# Patient Record
Sex: Male | Born: 1945 | Race: White | Hispanic: No | Smoking: Current every day smoker
Health system: Southern US, Community
[De-identification: ages and names within clinical notes are randomized; demographics above are authoritative.]

## PROBLEM LIST (undated history)

## (undated) DIAGNOSIS — I10 Essential (primary) hypertension: Secondary | ICD-10-CM

## (undated) DIAGNOSIS — Z87442 Personal history of urinary calculi: Secondary | ICD-10-CM

## (undated) DIAGNOSIS — C4490 Unspecified malignant neoplasm of skin, unspecified: Secondary | ICD-10-CM

## (undated) DIAGNOSIS — C439 Malignant melanoma of skin, unspecified: Secondary | ICD-10-CM

## (undated) DIAGNOSIS — K635 Polyp of colon: Secondary | ICD-10-CM

## (undated) DIAGNOSIS — K219 Gastro-esophageal reflux disease without esophagitis: Secondary | ICD-10-CM

## (undated) HISTORY — PX: EYE SURGERY: SHX253

## (undated) HISTORY — PX: KIDNEY STONE SURGERY: SHX686

## (undated) HISTORY — PX: EXTRACORPOREAL SHOCK WAVE LITHOTRIPSY: SHX1557

## (undated) HISTORY — PX: CYSTOSCOPY: SUR368

---

## 1994-07-05 DIAGNOSIS — C4491 Basal cell carcinoma of skin, unspecified: Secondary | ICD-10-CM

## 1994-07-05 HISTORY — DX: Basal cell carcinoma of skin, unspecified: C44.91

## 2002-09-20 ENCOUNTER — Encounter: Payer: Self-pay | Admitting: Urology

## 2002-09-20 ENCOUNTER — Ambulatory Visit (HOSPITAL_COMMUNITY): Admission: RE | Admit: 2002-09-20 | Discharge: 2002-09-20 | Payer: Self-pay | Admitting: Urology

## 2002-12-23 HISTORY — PX: COLONOSCOPY: SHX174

## 2003-01-20 ENCOUNTER — Ambulatory Visit (HOSPITAL_COMMUNITY): Admission: RE | Admit: 2003-01-20 | Discharge: 2003-01-20 | Payer: Self-pay | Admitting: Family Medicine

## 2003-01-20 ENCOUNTER — Encounter: Payer: Self-pay | Admitting: Family Medicine

## 2003-06-23 HISTORY — PX: COLONOSCOPY: SHX174

## 2003-07-04 ENCOUNTER — Ambulatory Visit (HOSPITAL_COMMUNITY): Admission: RE | Admit: 2003-07-04 | Discharge: 2003-07-04 | Payer: Self-pay | Admitting: Internal Medicine

## 2009-03-20 DIAGNOSIS — C4492 Squamous cell carcinoma of skin, unspecified: Secondary | ICD-10-CM

## 2009-03-20 HISTORY — DX: Squamous cell carcinoma of skin, unspecified: C44.92

## 2010-11-27 ENCOUNTER — Ambulatory Visit (HOSPITAL_COMMUNITY)
Admission: RE | Admit: 2010-11-27 | Discharge: 2010-11-27 | Payer: Self-pay | Source: Home / Self Care | Admitting: Family Medicine

## 2010-11-29 ENCOUNTER — Ambulatory Visit (HOSPITAL_COMMUNITY)
Admission: RE | Admit: 2010-11-29 | Discharge: 2010-11-29 | Payer: Self-pay | Source: Home / Self Care | Attending: Family Medicine | Admitting: Family Medicine

## 2011-05-10 NOTE — Op Note (Signed)
   NAME:  Sergio Schroeder, Sergio Schroeder                         ACCOUNT NO.:  [id number]   MEDICAL RECORD NO.:  [id number]                   PATIENT TYPE:  AMB   LOCATION:  DAY                                  FACILITY:  [facility]   PHYSICIAN:  Lionel December, M.D.                 DATE OF BIRTH:  09-Oct-1946   DATE OF PROCEDURE:  07/04/2003  DATE OF DISCHARGE:                                 OPERATIVE REPORT   PROCEDURE:  Total colonoscopy.   INDICATION:  Sergio Schroeder is a 65-year-old, Caucasian male, who is undergoing  screening colonoscopy.  He is at average risk for CRC.  Procedure is  reviewed with the patient.  Informed consent was obtained.   PREOPERATIVE MEDICATIONS:  1. Demerol 25 mg IV.  2. Versed 5 mg IV in divided dose.   FINDINGS:  Procedure performed in endoscopy suite.  The patient's vital  signs and O2 saturations were monitored during the procedure and remained  stable.  The patient was placed in left lateral decubitus position.  Rectal  examination was performed.  This was within normal limits.  Scope was placed  in the rectum and advanced under vision to the sigmoid colon and beyond.  Preparation was excellent.  A few scattered diverticula were noted at the  sigmoid and transverse colon.  The scope was passed to the cecum which was  identified by appendiceal orifice, ileocecal valve.  Short segment of TI was  also examined and was normal.  As the scope was withdrawn, colonic mucosa  once again carefully examined.  There were two small polyps at the rectum  which were ablated via cold biopsy and submitted in one container.  The  scope was retroflexed to examine anorectal junction which was unremarkable.  Endoscope was straightened and withdrawn.  The patient tolerated the  procedure well.   FINAL DIAGNOSES:  1. Few, scattered diverticula at the sigmoid and transverse colon.  2. Two small rectal polyps that were ablated via cold biopsy.    RECOMMENDATIONS:  1. High fiber diet.  2. I will  contact the patient with biopsy results and with further     recommendations.                                               Lionel December, M.D.    NR/MEDQ  D:  07/04/2003  T:  07/04/2003  Job:  [id number]   cc:   Angus G. Renard Matter, M.D.  [address]  [place]  [place] [address]  Fax: [phone]

## 2012-07-23 HISTORY — PX: MELANOMA EXCISION: SHX5266

## 2012-08-04 DIAGNOSIS — C439 Malignant melanoma of skin, unspecified: Secondary | ICD-10-CM

## 2012-08-04 HISTORY — DX: Malignant melanoma of skin, unspecified: C43.9

## 2013-05-06 NOTE — H&P (Signed)
  NTS SOAP Note  Vital Signs:  Vitals as of: 05/06/2013: Systolic 138: Diastolic 72: Heart Rate 104: Temp 68F: Height 61ft 8in: Weight 267Lbs 0 Ounces: Pain Level 7: BMI 40.6  BMI : 40.6 kg/m2  Subjective: This 32 Years 12 Months old Male presents for of    ABDOMINAL PAIN : ,Had right upper quadrant abdominal pain radiating to the right flank [place] the [place] soon after eating food.  Mild nausea noted.  No fever, chills, jaundice.  Has known h/o gallstones, seen by me in 2011.  Has had intermittent biliary colic since that time.  Last TCS 15 years ago, states some constipation, known h/o diverticulosis.  No hematochezia.  No emesis noted.  Review of Symptoms:  Constitutional:unremarkable   Head:unremarkable    Eyes:unremarkable   Nose/Mouth/Throat:unremarkable Cardiovascular:  unremarkable   Respiratory:unremarkable   Gastrointestin    abdominal pain Genitourinary:unremarkable       joint and back pain Skin:unremarkable Hematolgic/Lymphatic:unremarkable     Allergic/Immunologic:unremarkable     Past Medical History:    Reviewed   Past Medical History  Surgical History: melanoma excision abdominal wall 2013 Medical Problems:  High Blood pressure Allergies: nkda Medications: losartin   Social History:Reviewed  Social History  Preferred Language: English Race:  White Ethnicity: Hispanic / Latino Age: 67 Years 5 Months Marital Status:  D Alcohol:  Yes, socially Recreational drug(s):  No   Smoking Status: Current every day smoker reviewed on 05/06/2013 Started Date: 12/23/1965 Packs per day: 1.00 Functional Status reviewed on mm/dd/yyyy ------------------------------------------------ Bathing: Normal Cooking: Normal Dressing: Normal Driving: Normal Eating: Normal Managing Meds: Normal Oral Care: Normal Shopping: Normal Toileting: Normal Transferring: Normal Walking: Normal Cognitive Status reviewed on  mm/dd/yyyy ------------------------------------------------ Attention: Normal Decision Making: Normal Language: Normal Memory: Normal Motor: Normal Perception: Normal Problem Solving: Normal Visual and Spatial: Normal   Family History:  Reviewed   Family History              Father:  Cancer             Sibling:  Cancer    Objective Information: General:  Well appearing, well nourished in no distress.   no scleral icterus Heart:  RRR, no murmur Lungs:    CTA bilaterally, no wheezes, rhonchi, rales.  Breathing unlabored. Abdomen:Soft, NT/ND, no HSM, no masses.  Assessment:Biliary colic, cholelithiasis  Diagnosis &amp; Procedure Smart Code   Plan:Scheduled for laparoscopic cholecystectomy on 05/12/13.   Patient Education:Alternative treatments to surgery were discussed with patient (and family).  Risks and benefits  of procedure including bleeding, infection, hepatobiliary injury, and the possibility of an open procedure were fully explained to the patient (and family) who gave informed consent. Patient/family questions were addressed.  Will need TCS, possible EGD in near future.  Patient understands and agrees.  Follow-up:Pending Surgery

## 2013-05-07 ENCOUNTER — Encounter (HOSPITAL_COMMUNITY)
Admission: RE | Admit: 2013-05-07 | Discharge: 2013-05-07 | Disposition: A | Discharge status: Home / Self Care | Payer: Medicare Other | Source: Ambulatory Visit | Attending: General Surgery | Admitting: General Surgery

## 2013-05-07 ENCOUNTER — Encounter (HOSPITAL_COMMUNITY): Payer: Self-pay

## 2013-05-07 ENCOUNTER — Encounter (HOSPITAL_COMMUNITY): Payer: Self-pay | Admitting: Pharmacy Technician

## 2013-05-07 HISTORY — DX: Essential (primary) hypertension: I10

## 2013-05-07 HISTORY — DX: Malignant melanoma of skin, unspecified: C43.9

## 2013-05-07 HISTORY — DX: Personal history of urinary calculi: Z87.442

## 2013-05-07 LAB — CBC WITH DIFFERENTIAL/PLATELET
Basophils Relative: 0 % (ref 0–1)
Hemoglobin: 13.1 g/dL (ref 13.0–17.0)
Lymphs Abs: 1.8 10*3/uL (ref 0.7–4.0)
MCHC: 35.4 g/dL (ref 30.0–36.0)
Monocytes Relative: 12 % (ref 3–12)
Neutro Abs: 10.6 10*3/uL — ABNORMAL HIGH (ref 1.7–7.7)
Neutrophils Relative %: 75 % (ref 43–77)
Platelets: 407 10*3/uL — ABNORMAL HIGH (ref 150–400)
RBC: 3.72 MIL/uL — ABNORMAL LOW (ref 4.22–5.81)

## 2013-05-07 LAB — BASIC METABOLIC PANEL
Calcium: 9.1 mg/dL (ref 8.4–10.5)
GFR calc Af Amer: 48 mL/min — ABNORMAL LOW (ref >90)
GFR calc non Af Amer: 42 mL/min — ABNORMAL LOW (ref >90)
Potassium: 4.2 mEq/L (ref 3.5–5.1)
Sodium: 139 mEq/L (ref 135–145)

## 2013-05-07 LAB — SURGICAL PCR SCREEN
MRSA, PCR: NEGATIVE
Staphylococcus aureus: NEGATIVE

## 2013-05-07 LAB — HEPATIC FUNCTION PANEL
Bilirubin, Direct: 0.3 mg/dL (ref 0.0–0.3)
Total Bilirubin: 0.8 mg/dL (ref 0.3–1.2)

## 2013-05-07 MED ORDER — CHLORHEXIDINE GLUCONATE 4 % EX LIQD: 1.0000 "application " | Freq: Once | CUTANEOUS | Status: DC

## 2013-05-07 NOTE — Progress Notes (Signed)
05/07/13 0923  OBSTRUCTIVE SLEEP APNEA  Have you ever been diagnosed with sleep apnea through a sleep study? No  Do you snore loudly (loud enough to be heard through closed doors)?  1  Do you often feel tired, fatigued, or sleepy during the daytime? 0  Has anyone observed you stop breathing during your sleep? 0  Do you have, or are you being treated for high blood pressure? 1  BMI more than 35 kg/m2? 1  Age over 66 years old? 1  Neck circumference greater than 40 cm/18 inches? 1  Gender: 1  Obstructive Sleep Apnea Score 6  Score 4 or greater  Results sent to PCP

## 2013-05-07 NOTE — Patient Instructions (Signed)
Sergio Schroeder  05/07/2013   Your procedure is scheduled on:  Wednesday, 05/12/13  Report to [facility] [place] [facility].  Call this number if you have problems the morning of surgery: [phone]   Remember:   Do not eat food or drink liquids after midnight.   Take these medicines the morning of surgery with A SIP OF WATER: hyzaar (losartan)   Do not wear jewelry, make-up or nail polish.  Do not wear lotions, powders, or perfumes. You may wear deodorant.  Do not shave 48 hours prior to surgery. Men may shave face and neck.  Do not bring valuables to the hospital.  Contacts, dentures or bridgework may not be worn into surgery.  Leave suitcase in the car. After surgery it may be brought to your room.  For patients admitted to the hospital, checkout time is 11:00 AM the day of  discharge.   Patients discharged the day of surgery will not be allowed to drive  home.  Name and phone number of your driver: sister  Special Instructions: Shower using CHG 2 nights before surgery and the night before surgery.  If you shower the day of surgery use CHG.  Use special wash - you have one bottle of CHG for all showers.  You should use approximately 1/3 of the bottle for each shower.   Please read over the following fact sheets that you were given: Coughing and Deep Breathing, MRSA Information, Surgical Site Infection Prevention, Anesthesia Post-op Instructions and Care and Recovery After Surgery  Laparoscopic Cholecystectomy Laparoscopic cholecystectomy is surgery to remove the gallbladder. The gallbladder is located slightly to the right of center in the abdomen, behind the liver. It is a concentrating and storage sac for the bile produced in the liver. Bile aids in the digestion and absorption of fats. Gallbladder disease (cholecystitis) is an inflammation of your gallbladder. This condition is usually caused by a buildup of gallstones (cholelithiasis) in your gallbladder. Gallstones can block the flow of bile,  resulting in inflammation and pain. In severe cases, emergency surgery may be required. When emergency surgery is not required, you will have time to prepare for the procedure. Laparoscopic surgery is an alternative to open surgery. Laparoscopic surgery usually has a shorter recovery time. Your common bile duct may also need to be examined and explored. Your caregiver will discuss this with you if he or she feels this should be done. If stones are found in the common bile duct, they may be removed. LET YOUR CAREGIVER KNOW ABOUT:  Allergies to food or medicine.  Medicines taken, including vitamins, herbs, eyedrops, over-the-counter medicines, and creams.  Use of steroids (by mouth or creams).  Previous problems with anesthetics or numbing medicines.  History of bleeding problems or blood clots.  Previous surgery.  Other health problems, including diabetes and kidney problems.  Possibility of pregnancy, if this applies. RISKS AND COMPLICATIONS All surgery is associated with risks. Some problems that may occur following this procedure include:  Infection.  Damage to the common bile duct, nerves, arteries, veins, or other internal organs such as the stomach or intestines.  Bleeding.  A stone may remain in the common bile duct. BEFORE THE PROCEDURE  Do not take aspirin for 3 days prior to surgery or blood thinners for 1 week prior to surgery.  Do not eat or drink anything after midnight the night before surgery.  Let your caregiver know if you develop a cold or other infectious problem prior to surgery.  You  should be present 60 minutes before the procedure or as directed. PROCEDURE  You will be given medicine that makes you sleep (general anesthetic). When you are asleep, your surgeon will make several small cuts (incisions) in your abdomen. One of these incisions is used to insert a small, lighted scope (laparoscope) into the abdomen. The laparoscope helps the surgeon see into  your abdomen. Carbon dioxide gas will be pumped into your abdomen. The gas allows more room for the surgeon to perform your surgery. Other operating instruments are inserted through the other incisions. Laparoscopic procedures may not be appropriate when:  There is major scarring from previous surgery.  The gallbladder is extremely inflamed.  There are bleeding disorders or unexpected cirrhosis of the liver.  A pregnancy is near term.  Other conditions make the laparoscopic procedure impossible. If your surgeon feels it is not safe to continue with a laparoscopic procedure, he or she will perform an open abdominal procedure. In this case, the surgeon will make an incision to open the abdomen. This gives the surgeon a larger view and field to work within. This may allow the surgeon to perform procedures that sometimes cannot be performed with a laparoscope alone. Open surgery has a longer recovery time. AFTER THE PROCEDURE  You will be taken to the recovery area where a nurse will watch and check your progress.  You may be allowed to go home the same day.  Do not resume physical activities until directed by your caregiver.  You may resume a normal diet and activities as directed. Document Released: 12/09/2005 Document Revised: 03/02/2012 Document Reviewed: 05/24/2011 [facility] Patient Information 2013 [place], [place]. Laparoscopic Cholecystectomy Care After These instructions give you information on caring for yourself after your procedure. Your doctor may also give you more specific instructions. Call your doctor if you have any problems or questions after your procedure. HOME CARE  Change your bandages (dressings) as told by your doctor.  Keep the wound dry and clean. Wash the wound gently with soap and water. Pat the wound dry with a clean towel.  Do not take baths, swim, or use hot tubs for 10 days, or as told by your doctor.  Only take medicine as told by your doctor.  Eat a  normal diet as told by your doctor.  Do not lift anything heavier than 25 pounds (11.5 kg), or as told by your doctor.  Do not play contact sports for 1 week, or as told by your doctor. GET HELP RIGHT AWAY IF:   Your wound is red, puffy (swollen), or painful.  You have yellowish-white fluid (pus) coming from the wound.  You have fluid draining from the wound for more than 1 day.  You have a bad smell coming from the wound.  Your wound breaks open.  You have a rash.  You have trouble breathing.  You have chest pain.  You have a bad reaction to your medicine.  You have a fever.  You have pain in the shoulders (shoulder strap areas).  You feel dizzy or pass out (faint).  You have severe belly (abdominal) pain.  You feel sick to your stomach (nauseous) or throw up (vomit) for more than 1 day. MAKE SURE YOU:  Understand these instructions.  Will watch your condition.  Will get help right away if you are not doing well or get worse. Document Released: 09/17/2008 Document Revised: 03/02/2012 Document Reviewed: 05/28/2011 [facility] Patient Information 2013 [place], [place].

## 2013-05-12 ENCOUNTER — Ambulatory Visit (HOSPITAL_COMMUNITY): Payer: Medicare Other | Admitting: Anesthesiology

## 2013-05-12 ENCOUNTER — Encounter (HOSPITAL_COMMUNITY)
Admission: RE | Disposition: A | Discharge status: Home / Self Care | Payer: Self-pay | Source: Ambulatory Visit | Attending: General Surgery

## 2013-05-12 ENCOUNTER — Encounter (HOSPITAL_COMMUNITY): Payer: Self-pay | Admitting: Anesthesiology

## 2013-05-12 ENCOUNTER — Encounter (HOSPITAL_COMMUNITY): Payer: Self-pay | Admitting: *Deleted

## 2013-05-12 ENCOUNTER — Inpatient Hospital Stay (HOSPITAL_COMMUNITY)
Admission: RE | Admit: 2013-05-12 | Discharge: 2013-05-16 | DRG: 419 | Disposition: A | Discharge status: Home / Self Care | Payer: Medicare Other | Source: Ambulatory Visit | Attending: General Surgery | Admitting: General Surgery

## 2013-05-12 DIAGNOSIS — F17200 Nicotine dependence, unspecified, uncomplicated: Secondary | ICD-10-CM | POA: Diagnosis present

## 2013-05-12 DIAGNOSIS — D72829 Elevated white blood cell count, unspecified: Secondary | ICD-10-CM | POA: Diagnosis present

## 2013-05-12 DIAGNOSIS — K8000 Calculus of gallbladder with acute cholecystitis without obstruction: Principal | ICD-10-CM | POA: Diagnosis present

## 2013-05-12 HISTORY — PX: CHOLECYSTECTOMY: SHX55

## 2013-05-12 LAB — HEPATIC FUNCTION PANEL
ALT: 56 U/L — ABNORMAL HIGH (ref 0–53)
AST: 57 U/L — ABNORMAL HIGH (ref 0–37)
Alkaline Phosphatase: 63 U/L (ref 39–117)
Indirect Bilirubin: 0.3 mg/dL (ref 0.3–0.9)
Total Protein: 6.9 g/dL (ref 6.0–8.3)

## 2013-05-12 SURGERY — LAPAROSCOPIC CHOLECYSTECTOMY
Anesthesia: General | Site: Abdomen | Wound class: Contaminated

## 2013-05-12 MED ORDER — ONDANSETRON HCL 4 MG/2ML IJ SOLN
INTRAMUSCULAR | Status: AC
  Filled 2013-05-12: qty 2

## 2013-05-12 MED ORDER — LACTATED RINGERS IV SOLN
INTRAVENOUS | Status: DC
  Administered 2013-05-12: 07:00:00 via INTRAVENOUS

## 2013-05-12 MED ORDER — FENTANYL CITRATE 0.05 MG/ML IJ SOLN
INTRAMUSCULAR | Status: AC
  Filled 2013-05-12: qty 2

## 2013-05-12 MED ORDER — GLYCOPYRROLATE 0.2 MG/ML IJ SOLN
INTRAMUSCULAR | Status: AC
  Filled 2013-05-12: qty 3

## 2013-05-12 MED ORDER — ENOXAPARIN SODIUM 40 MG/0.4ML ~~LOC~~ SOLN
40.0000 mg | Freq: Once | SUBCUTANEOUS | Status: AC
  Administered 2013-05-12: 40 mg via SUBCUTANEOUS

## 2013-05-12 MED ORDER — PHENYLEPHRINE HCL 10 MG/ML IJ SOLN
INTRAMUSCULAR | Status: DC | PRN
  Administered 2013-05-12: 100 ug via INTRAVENOUS

## 2013-05-12 MED ORDER — ROCURONIUM BROMIDE 50 MG/5ML IV SOLN
INTRAVENOUS | Status: AC
  Filled 2013-05-12: qty 1

## 2013-05-12 MED ORDER — FENTANYL CITRATE 0.05 MG/ML IJ SOLN
INTRAMUSCULAR | Status: DC | PRN
  Administered 2013-05-12 (×5): 50 ug via INTRAVENOUS
  Administered 2013-05-12: 100 ug via INTRAVENOUS
  Administered 2013-05-12 (×2): 50 ug via INTRAVENOUS

## 2013-05-12 MED ORDER — FENTANYL CITRATE 0.05 MG/ML IJ SOLN
25.0000 ug | INTRAMUSCULAR | Status: DC | PRN
  Administered 2013-05-12 (×2): 50 ug via INTRAVENOUS

## 2013-05-12 MED ORDER — HYDROMORPHONE HCL PF 1 MG/ML IJ SOLN
1.0000 mg | INTRAMUSCULAR | Status: DC | PRN
  Administered 2013-05-12 – 2013-05-14 (×9): 1 mg via INTRAVENOUS
  Filled 2013-05-12 (×7): qty 1

## 2013-05-12 MED ORDER — GLYCOPYRROLATE 0.2 MG/ML IJ SOLN: 0.2000 mg | Freq: Once | INTRAMUSCULAR | Status: DC

## 2013-05-12 MED ORDER — ARTIFICIAL TEARS OP OINT
TOPICAL_OINTMENT | OPHTHALMIC | Status: AC
  Filled 2013-05-12: qty 3.5

## 2013-05-12 MED ORDER — MIDAZOLAM HCL 2 MG/2ML IJ SOLN
1.0000 mg | INTRAMUSCULAR | Status: DC | PRN
  Administered 2013-05-12: 2 mg via INTRAVENOUS

## 2013-05-12 MED ORDER — BUPIVACAINE HCL (PF) 0.5 % IJ SOLN
INTRAMUSCULAR | Status: AC
  Filled 2013-05-12: qty 30

## 2013-05-12 MED ORDER — CLINDAMYCIN PHOSPHATE 900 MG/50ML IV SOLN
INTRAVENOUS | Status: AC
  Filled 2013-05-12: qty 50

## 2013-05-12 MED ORDER — CLINDAMYCIN PHOSPHATE 900 MG/50ML IV SOLN: 900.0000 mg | Freq: Once | INTRAVENOUS | Status: DC

## 2013-05-12 MED ORDER — ACETAMINOPHEN 10 MG/ML IV SOLN
1000.0000 mg | Freq: Four times a day (QID) | INTRAVENOUS | Status: AC
  Administered 2013-05-12 – 2013-05-13 (×4): 1000 mg via INTRAVENOUS
  Filled 2013-05-12 (×3): qty 100

## 2013-05-12 MED ORDER — HYDROMORPHONE HCL PF 1 MG/ML IJ SOLN
1.0000 mg | INTRAMUSCULAR | Status: DC | PRN
  Administered 2013-05-12: 1 mg via INTRAVENOUS
  Filled 2013-05-12 (×3): qty 1

## 2013-05-12 MED ORDER — PIPERACILLIN-TAZOBACTAM 3.375 G IVPB
3.3750 g | Freq: Three times a day (TID) | INTRAVENOUS | Status: DC
  Administered 2013-05-12 – 2013-05-16 (×12): 3.375 g via INTRAVENOUS
  Filled 2013-05-12 (×16): qty 50

## 2013-05-12 MED ORDER — SUCCINYLCHOLINE CHLORIDE 20 MG/ML IJ SOLN
INTRAMUSCULAR | Status: AC
  Filled 2013-05-12: qty 1

## 2013-05-12 MED ORDER — FENTANYL CITRATE 0.05 MG/ML IJ SOLN
INTRAMUSCULAR | Status: AC
  Filled 2013-05-12: qty 5

## 2013-05-12 MED ORDER — BUPIVACAINE HCL (PF) 0.5 % IJ SOLN
INTRAMUSCULAR | Status: DC | PRN
  Administered 2013-05-12: 10 mL

## 2013-05-12 MED ORDER — PROPOFOL 10 MG/ML IV BOLUS
INTRAVENOUS | Status: DC | PRN
  Administered 2013-05-12: 150 mg via INTRAVENOUS

## 2013-05-12 MED ORDER — ACETAMINOPHEN 10 MG/ML IV SOLN
INTRAVENOUS | Status: AC
  Filled 2013-05-12: qty 100

## 2013-05-12 MED ORDER — PROPOFOL 10 MG/ML IV EMUL
INTRAVENOUS | Status: AC
  Filled 2013-05-12: qty 20

## 2013-05-12 MED ORDER — ONDANSETRON HCL 4 MG/2ML IJ SOLN: 4.0000 mg | Freq: Four times a day (QID) | INTRAMUSCULAR | Status: DC | PRN

## 2013-05-12 MED ORDER — LIDOCAINE HCL (CARDIAC) 20 MG/ML IV SOLN
INTRAVENOUS | Status: DC | PRN
  Administered 2013-05-12: 50 mg via INTRAVENOUS

## 2013-05-12 MED ORDER — 0.9 % SODIUM CHLORIDE (POUR BTL) OPTIME
TOPICAL | Status: DC | PRN
  Administered 2013-05-12: 1000 mL

## 2013-05-12 MED ORDER — LOSARTAN POTASSIUM 50 MG PO TABS
100.0000 mg | ORAL_TABLET | Freq: Every day | ORAL | Status: DC
  Administered 2013-05-12 – 2013-05-16 (×4): 100 mg via ORAL
  Filled 2013-05-12 (×5): qty 2

## 2013-05-12 MED ORDER — NEOSTIGMINE METHYLSULFATE 1 MG/ML IJ SOLN
INTRAMUSCULAR | Status: AC
  Filled 2013-05-12: qty 1

## 2013-05-12 MED ORDER — ENOXAPARIN SODIUM 40 MG/0.4ML ~~LOC~~ SOLN
SUBCUTANEOUS | Status: AC
  Filled 2013-05-12: qty 0.4

## 2013-05-12 MED ORDER — ONDANSETRON HCL 4 MG/2ML IJ SOLN
4.0000 mg | Freq: Once | INTRAMUSCULAR | Status: AC
  Administered 2013-05-12: 4 mg via INTRAVENOUS

## 2013-05-12 MED ORDER — LACTATED RINGERS IV SOLN
INTRAVENOUS | Status: DC | PRN
  Administered 2013-05-12 (×3): via INTRAVENOUS

## 2013-05-12 MED ORDER — ONDANSETRON HCL 4 MG/2ML IJ SOLN: 4.0000 mg | Freq: Once | INTRAMUSCULAR | Status: DC | PRN

## 2013-05-12 MED ORDER — SODIUM CHLORIDE 0.9 % IR SOLN
Status: DC | PRN
  Administered 2013-05-12: 3000 mL

## 2013-05-12 MED ORDER — EPHEDRINE SULFATE 50 MG/ML IJ SOLN
INTRAMUSCULAR | Status: AC
  Filled 2013-05-12: qty 1

## 2013-05-12 MED ORDER — ONDANSETRON HCL 4 MG PO TABS: 4.0000 mg | ORAL_TABLET | Freq: Four times a day (QID) | ORAL | Status: DC | PRN

## 2013-05-12 MED ORDER — ENOXAPARIN SODIUM 40 MG/0.4ML ~~LOC~~ SOLN
40.0000 mg | SUBCUTANEOUS | Status: DC
  Administered 2013-05-13 – 2013-05-16 (×4): 40 mg via SUBCUTANEOUS
  Filled 2013-05-12 (×4): qty 0.4

## 2013-05-12 MED ORDER — MIDAZOLAM HCL 2 MG/2ML IJ SOLN
INTRAMUSCULAR | Status: AC
  Filled 2013-05-12: qty 2

## 2013-05-12 MED ORDER — LOSARTAN POTASSIUM-HCTZ 100-25 MG PO TABS: 1.0000 | ORAL_TABLET | Freq: Every day | ORAL | Status: DC

## 2013-05-12 MED ORDER — NEOSTIGMINE METHYLSULFATE 1 MG/ML IJ SOLN
INTRAMUSCULAR | Status: DC | PRN
  Administered 2013-05-12: 4 mg via INTRAVENOUS

## 2013-05-12 MED ORDER — LACTATED RINGERS IV SOLN
INTRAVENOUS | Status: DC
  Administered 2013-05-12 – 2013-05-13 (×2): via INTRAVENOUS

## 2013-05-12 MED ORDER — ROCURONIUM BROMIDE 100 MG/10ML IV SOLN
INTRAVENOUS | Status: DC | PRN
  Administered 2013-05-12: 10 mg via INTRAVENOUS
  Administered 2013-05-12: 30 mg via INTRAVENOUS
  Administered 2013-05-12 (×2): 10 mg via INTRAVENOUS

## 2013-05-12 MED ORDER — EPHEDRINE SULFATE 50 MG/ML IJ SOLN
INTRAMUSCULAR | Status: DC | PRN
  Administered 2013-05-12 (×3): 10 mg via INTRAVENOUS

## 2013-05-12 MED ORDER — CLINDAMYCIN PHOSPHATE 900 MG/50ML IV SOLN
INTRAVENOUS | Status: DC | PRN
  Administered 2013-05-12: 900 mg via INTRAVENOUS

## 2013-05-12 MED ORDER — HYDROCHLOROTHIAZIDE 25 MG PO TABS
25.0000 mg | ORAL_TABLET | Freq: Every day | ORAL | Status: DC
  Administered 2013-05-12 – 2013-05-16 (×4): 25 mg via ORAL
  Filled 2013-05-12 (×5): qty 1

## 2013-05-12 MED ORDER — PHENYLEPHRINE HCL 10 MG/ML IJ SOLN
INTRAMUSCULAR | Status: AC
  Filled 2013-05-12: qty 1

## 2013-05-12 MED ORDER — LIDOCAINE HCL (PF) 1 % IJ SOLN
INTRAMUSCULAR | Status: AC
  Filled 2013-05-12: qty 5

## 2013-05-12 MED ORDER — GLYCOPYRROLATE 0.2 MG/ML IJ SOLN
INTRAMUSCULAR | Status: DC | PRN
  Administered 2013-05-12: 0.6 mg via INTRAVENOUS

## 2013-05-12 MED ORDER — HEMOSTATIC AGENTS (NO CHARGE) OPTIME
TOPICAL | Status: DC | PRN
  Administered 2013-05-12 (×2): 1 via TOPICAL

## 2013-05-12 MED ORDER — SUCCINYLCHOLINE CHLORIDE 20 MG/ML IJ SOLN
INTRAMUSCULAR | Status: DC | PRN
  Administered 2013-05-12: 120 mg via INTRAVENOUS

## 2013-05-12 SURGICAL SUPPLY — 44 items
APPLIER CLIP LAPSCP 10X32 DD (CLIP) ×2 IMPLANT
BAG HAMPER (MISCELLANEOUS) ×2 IMPLANT
BAG SPEC RTRVL LRG 6X4 10 (ENDOMECHANICALS) ×1
CLOTH BEACON ORANGE TIMEOUT ST (SAFETY) ×2 IMPLANT
COVER LIGHT HANDLE STERIS (MISCELLANEOUS) ×4 IMPLANT
CUTTER LINEAR ENDO 35 ETS TH (STAPLE) ×1 IMPLANT
DECANTER SPIKE VIAL GLASS SM (MISCELLANEOUS) ×2 IMPLANT
DISSECTOR BLUNT TIP ENDO 5MM (MISCELLANEOUS) ×1 IMPLANT
DURAPREP 26ML APPLICATOR (WOUND CARE) ×2 IMPLANT
ELECT REM PT RETURN 9FT ADLT (ELECTROSURGICAL) ×2
ELECTRODE REM PT RTRN 9FT ADLT (ELECTROSURGICAL) ×1 IMPLANT
EVACUATOR DRAINAGE 10X20 100CC (DRAIN) IMPLANT
EVACUATOR SILICONE 100CC (DRAIN) ×2
FILTER SMOKE EVAC LAPAROSHD (FILTER) ×2 IMPLANT
FORMALIN 10 PREFIL 120ML (MISCELLANEOUS) ×2 IMPLANT
GLOVE BIO SURGEON STRL SZ7.5 (GLOVE) ×2 IMPLANT
GLOVE BIOGEL PI IND STRL 7.0 (GLOVE) IMPLANT
GLOVE BIOGEL PI INDICATOR 7.0 (GLOVE) ×3
GLOVE ECLIPSE 6.5 STRL STRAW (GLOVE) ×2 IMPLANT
GLOVE ECLIPSE 7.0 STRL STRAW (GLOVE) ×1 IMPLANT
GLOVE EXAM NITRILE MD LF STRL (GLOVE) ×1 IMPLANT
GOWN STRL REIN XL XLG (GOWN DISPOSABLE) ×3 IMPLANT
HEMOSTAT SNOW SURGICEL 2X4 (HEMOSTASIS) ×3 IMPLANT
INST SET LAPROSCOPIC AP (KITS) ×2 IMPLANT
IV NS IRRIG 3000ML ARTHROMATIC (IV SOLUTION) ×1 IMPLANT
KIT ROOM TURNOVER APOR (KITS) ×2 IMPLANT
KIT TROCAR LAP CHOLE (TROCAR) ×2 IMPLANT
MANIFOLD NEPTUNE II (INSTRUMENTS) ×2 IMPLANT
NS IRRIG 1000ML POUR BTL (IV SOLUTION) ×2 IMPLANT
PACK LAP CHOLE LZT030E (CUSTOM PROCEDURE TRAY) ×2 IMPLANT
PAD ARMBOARD 7.5X6 YLW CONV (MISCELLANEOUS) ×2 IMPLANT
POUCH SPECIMEN RETRIEVAL 10MM (ENDOMECHANICALS) ×2 IMPLANT
SET BASIN LINEN APH (SET/KITS/TRAYS/PACK) ×2 IMPLANT
SET TUBE IRRIG SUCTION NO TIP (IRRIGATION / IRRIGATOR) ×1 IMPLANT
SLEEVE Z-THREAD 5X100MM (TROCAR) ×1 IMPLANT
SPONGE DRAIN TRACH 4X4 STRL 2S (GAUZE/BANDAGES/DRESSINGS) ×1 IMPLANT
SPONGE GAUZE 2X2 8PLY STRL LF (GAUZE/BANDAGES/DRESSINGS) ×8 IMPLANT
STAPLER VISISTAT (STAPLE) ×2 IMPLANT
SUT ETHILON 3 0 FSL (SUTURE) ×1 IMPLANT
SUT SILK 0 FSL (SUTURE) ×1 IMPLANT
SUT VICRYL 0 UR6 27IN ABS (SUTURE) ×2 IMPLANT
TAPE CLOTH SURG 4X10 WHT LF (GAUZE/BANDAGES/DRESSINGS) ×1 IMPLANT
WARMER LAPAROSCOPE (MISCELLANEOUS) ×2 IMPLANT
YANKAUER SUCT 12FT TUBE ARGYLE (SUCTIONS) ×2 IMPLANT

## 2013-05-12 NOTE — Anesthesia Postprocedure Evaluation (Addendum)
  Anesthesia Post-op Note  Patient: Ernst L Reichel  Procedure(s) Performed: Procedure(s): LAPAROSCOPIC CHOLECYSTECTOMY (N/A)  Patient Location: PACU  Anesthesia Type:General  Level of Consciousness: sedated and patient cooperative  Airway and Oxygen Therapy: Patient Spontanous Breathing and Patient connected to face mask oxygen  Post-op Pain: mild  Post-op Assessment: Post-op Vital signs reviewed, Patient's Cardiovascular Status Stable, Respiratory Function Stable, Patent Airway, No signs of Nausea or vomiting, Adequate PO intake and Pain level controlled  Post-op Vital Signs: Reviewed and stable  Complications: No apparent anesthesia complications 05/13/13  Patient out of bed, VSS, remains on O2 via [place].  No apparent anesthesia complications.

## 2013-05-12 NOTE — Interval H&P Note (Signed)
History and Physical Interval Note:  05/12/2013 7:21 AM  Sergio Schroeder  has presented today for surgery, with the diagnosis of cholelithiasis  The various methods of treatment have been discussed with the patient and family. After consideration of risks, benefits and other options for treatment, the patient has consented to  Procedure(s): LAPAROSCOPIC CHOLECYSTECTOMY (N/A) as a surgical intervention .  The patient's history has been reviewed, patient examined, no change in status, stable for surgery.  I have reviewed the patient's chart and labs.  Questions were answered to the patient's satisfaction.     Franky Macho A

## 2013-05-12 NOTE — Anesthesia Procedure Notes (Signed)
Procedure Name: Intubation Date/Time: 05/12/2013 7:37 AM Performed by: Carolyne Littles, AMY L Pre-anesthesia Checklist: Patient identified, Patient being monitored, Timeout performed, Emergency Drugs available and Suction available Patient Re-evaluated:Patient Re-evaluated prior to inductionOxygen Delivery Method: Circle System Utilized Preoxygenation: Pre-oxygenation with 100% oxygen Intubation Type: IV induction, Rapid sequence and Cricoid Pressure applied Ventilation: Mask ventilation without difficulty Laryngoscope Size: 3 and Miller Grade View: Grade I Tube type: Oral Tube size: 7.0 mm Number of attempts: 1 Airway Equipment and Method: stylet Placement Confirmation: ETT inserted through vocal cords under direct vision,  positive ETCO2 and breath sounds checked- equal and bilateral Secured at: 21 cm Tube secured with: Tape Dental Injury: Teeth and Oropharynx as per pre-operative assessment

## 2013-05-12 NOTE — Anesthesia Preprocedure Evaluation (Signed)
Anesthesia Evaluation  Patient identified by MRN, date of birth, ID band Patient awake    Reviewed: Allergy & Precautions, H&P , NPO status , Patient's Chart, lab work & pertinent test results  Airway Mallampati: II TM Distance: >3 FB Neck ROM: Full    Dental  (+) Teeth Intact   Pulmonary Current Smoker (am cough),  breath sounds clear to auscultation        Cardiovascular hypertension, Pt. on medications Rhythm:Regular Rate:Normal     Neuro/Psych    GI/Hepatic GERD-  ,  Endo/Other  Morbid obesity  Renal/GU      Musculoskeletal   Abdominal   Peds  Hematology   Anesthesia Other Findings   Reproductive/Obstetrics                           Anesthesia Physical Anesthesia Plan  ASA: II  Anesthesia Plan: General   Post-op Pain Management:    Induction: Intravenous, Rapid sequence and Cricoid pressure planned  Airway Management Planned: Oral ETT  Additional Equipment:   Intra-op Plan:   Post-operative Plan: Extubation in OR  Informed Consent: I have reviewed the patients History and Physical, chart, labs and discussed the procedure including the risks, benefits and alternatives for the proposed anesthesia with the patient or authorized representative who has indicated his/her understanding and acceptance.     Plan Discussed with:   Anesthesia Plan Comments:         Anesthesia Quick Evaluation

## 2013-05-12 NOTE — Preoperative (Signed)
Beta Blockers   Reason not to administer Beta Blockers:Not Applicable 

## 2013-05-12 NOTE — Transfer of Care (Signed)
Immediate Anesthesia Transfer of Care Note  Patient: Sergio Schroeder  Procedure(s) Performed: Procedure(s): LAPAROSCOPIC CHOLECYSTECTOMY (N/A)  Patient Location: PACU  Anesthesia Type:General  Level of Consciousness: sedated and patient cooperative  Airway & Oxygen Therapy: Patient Spontanous Breathing and Patient connected to face mask oxygen  Post-op Assessment: Report given to PACU RN and Post -op Vital signs reviewed and stable  Post vital signs: Reviewed and stable  Complications: No apparent anesthesia complications

## 2013-05-12 NOTE — Op Note (Signed)
Patient:  Sergio Schroeder  DOB:  07/28/1946  MRN:  [id number]   Preop Diagnosis:  Cholecystitis, cholelithiasis  Postop Diagnosis:  Same, gangrene of gallbladder  Procedure:  Laparoscopic cholecystectomy  Surgeon:  Franky Macho, M.D.  Anes:  General endotracheal  Indications:  Patient is a 67 year old white male with a known history of gallstones who had an attack of biliary colic last week. He now presents for laparoscopic cholecystectomy. The risks and benefits of the procedure including bleeding, infection, hepatobiliary injury, and the possibility of an open procedure were fully explained to the patient, who gave informed consent.  Procedure note:  The patient is placed the supine position. After induction of general endotracheal anesthesia, the abdomen was prepped and draped using usual sterile technique with DuraPrep. Surgical site confirmation was performed.  A supraumbilical incision was made down to the fascia. A Veress needle was introduced into the abdominal cavity and confirmation of placement was done using the saline drop test. The abdomen was then insufflated to 16 mm mercury pressure. An 11 mm trocar was introduced into the abdominal cavity under direct visualization without difficulty. The patient was placed in reverse Trendelenburg position and an additional 11 mm trocar was placed the epigastric region and 5 mm trochars were placed the right the quadrant and right flank regions. The liver was inspected and noted within normal limits. The gallbladder was noted to be diffusely inflamed with a thickened wall. The colon was attached to the gallbladder and this had to be freed away bluntly. In order to facilitate further exposure, an additional 5 mm trocar was placed in the upper midline abdominal wall. The wall was friable in the gallbladder lumen was entered into. Multiple stones were removed as well as purulent fluid. The dissection was done in a dynamic fashion in order to expose  the triangle of Calot.  Both an infundibular as well as retrograde dissection was performed. The cystic duct was ultimately identified. A vascular Endo GIA was placed across the cystic duct and fired. The cystic artery was divided and ligated without difficulty. The gallbladder was then freed away from its remaining attachments using Bovie electrocautery. The gallbladder was removed using an Endo Catch bag and sent to pathology further examination. The gallbladder wall was inspected and any bleeding was controlled using Bovie electrocautery. Surgicel is placed in the gallbladder fossa. A #10 flat Jackson-Pratt drain was placed into the subhepatic space and brought through a 5 mm lateral trocar site. It was secured at the skin level using 3-0 nylon interrupted suture. All fluid and air were then evacuated from the abdominal cavity prior to removal of the trochars.  All wounds were gave normal saline. All wounds were checked with 0.5% Sensorcaine. The suprabuccal fashion as well as epigastric fascia reapproximated using 0 Vicryl interrupted sutures. All skin incisions were closed using staples. Betadine ointment dorsal dressings were applied.  All tape and needle counts were correct at the end of the procedure. The patient was extubated in the operating room and transferred to PACU in stable condition.  Complications:  None  EBL:  50 cc  Specimen:  Gallbladder  Drains: JP drain to the subhepatic space

## 2013-05-13 ENCOUNTER — Encounter (HOSPITAL_COMMUNITY): Payer: Self-pay | Admitting: General Surgery

## 2013-05-13 LAB — BASIC METABOLIC PANEL
BUN: 27 mg/dL — ABNORMAL HIGH (ref 6–23)
CO2: 33 mEq/L — ABNORMAL HIGH (ref 19–32)
Calcium: 9 mg/dL (ref 8.4–10.5)
Creatinine, Ser: 1.31 mg/dL (ref 0.50–1.35)
Glucose, Bld: 104 mg/dL — ABNORMAL HIGH (ref 70–99)

## 2013-05-13 LAB — CBC
HCT: 34 % — ABNORMAL LOW (ref 39.0–52.0)
Hemoglobin: 11.6 g/dL — ABNORMAL LOW (ref 13.0–17.0)
MCV: 99.4 fL (ref 78.0–100.0)
RBC: 3.42 MIL/uL — ABNORMAL LOW (ref 4.22–5.81)
RDW: 18.9 % — ABNORMAL HIGH (ref 11.5–15.5)
WBC: 14 10*3/uL — ABNORMAL HIGH (ref 4.0–10.5)

## 2013-05-13 LAB — MAGNESIUM: Magnesium: 1.1 mg/dL — ABNORMAL LOW (ref 1.5–2.5)

## 2013-05-13 MED ORDER — ACETAMINOPHEN 10 MG/ML IV SOLN
1000.0000 mg | Freq: Four times a day (QID) | INTRAVENOUS | Status: AC
  Administered 2013-05-13 (×2): 1000 mg via INTRAVENOUS
  Filled 2013-05-13 (×4): qty 100

## 2013-05-13 MED ORDER — KCL IN DEXTROSE-NACL 40-5-0.45 MEQ/L-%-% IV SOLN
INTRAVENOUS | Status: DC
  Administered 2013-05-13 – 2013-05-14 (×3): via INTRAVENOUS

## 2013-05-13 NOTE — Progress Notes (Signed)
UR chart review completed.  

## 2013-05-13 NOTE — Care Management Note (Unsigned)
    Page 1 of 1   05/13/2013     1:22:32 PM   CARE MANAGEMENT NOTE 05/13/2013  Patient:  Sergio Schroeder, Sergio Schroeder   Account Number:  [id number]  Date Initiated:  05/13/2013  Documentation initiated by:  Sharrie Rothman  Subjective/Objective Assessment:   Pt admitted from home s/p lap gangreen gallbladder. Pt lives alone but has a sister who will be available to help pt out at discharge. Pt is independent with ADL's.     Action/Plan:   Pt will discharge with JP drain but pt feels he does not need any HH. CM will continue to monitor for any HH needs.   Anticipated DC Date:  05/15/2013   Anticipated DC Plan:  HOME/SELF CARE      DC Planning Services  CM consult      Choice offered to / List presented to:             Status of service:  Completed, signed off Medicare Important Message given?   (If response is "NO", the following Medicare IM given date fields will be blank) Date Medicare IM given:   Date Additional Medicare IM given:    Discharge Disposition:    Per UR Regulation:    If discussed at Long Length of Stay Meetings, dates discussed:    Comments:  05/13/13 1321 Arlyss Queen, RN BSN CM

## 2013-05-13 NOTE — Progress Notes (Signed)
1 Day Post-Op  Subjective: Is having incisional and right sided flank pain. Is controlled with IV Dilaudid.  Objective: Vital signs in last 24 hours: Temp:  [97.2 F (36.2 C)-98.7 F (37.1 C)] 97.4 F (36.3 C) (05/22 0552) Pulse Rate:  [75-96] 96 (05/22 0552) Resp:  [18-26] 18 (05/22 0552) BP: (81-127)/(49-78) 126/63 mmHg (05/22 0552) SpO2:  [90 %-98 %] 90 % (05/22 0552) Last BM Date: 05/12/13  Intake/Output from previous day: 05/21 0701 - 05/22 0700 In: 5348.3 [P.O.:390; I.V.:4408.3; IV Piggyback:550] Out: 1490 [Urine:1000; Drains:480; Blood:10] Intake/Output this shift: Total I/O In: 70 [Other:70] Out: 500 [Urine:500]  General appearance: alert, cooperative and fatigued Resp: clear to auscultation bilaterally Cardio: regular rate and rhythm, S1, S2 normal, no murmur, click, rub or gallop GI: Soft. No rigidity noted. Dressings dry and intact. JP drainage with serous sanguinous fluid present.  Lab Results:   Recent Labs  05/13/13 0455  WBC 14.0*  HGB 11.6*  HCT 34.0*  PLT 378   BMET  Recent Labs  05/13/13 0455  NA 136  K 3.4*  CL 93*  CO2 33*  GLUCOSE 104*  BUN 27*  CREATININE 1.31  CALCIUM 9.0   PT/INR No results found for this basename: LABPROT, INR,  in the last 72 hours  Studies/Results: No results found.  Anti-infectives: Anti-infectives   Start     Dose/Rate Route Frequency Ordered Stop   05/12/13 1300  piperacillin-tazobactam (ZOSYN) IVPB 3.375 g     3.375 g 12.5 mL/hr over 240 Minutes Intravenous Every 8 hours 05/12/13 1241     05/12/13 0700  clindamycin (CLEOCIN) IVPB 900 mg  Status:  Discontinued     900 mg 100 mL/hr over 30 Minutes Intravenous  Once 05/12/13 0650 05/12/13 1130      Assessment/Plan: s/p Procedure(s): LAPAROSCOPIC CHOLECYSTECTOMY Impression: Is stable on postoperative day one. Will continue IV antibiotic. Advance diet as tolerated. Anticipate discharge in next 24-48 hours.  LOS: 1 day    Doranne Schmutz  A 05/13/2013

## 2013-05-14 LAB — BASIC METABOLIC PANEL
BUN: 22 mg/dL (ref 6–23)
CO2: 30 mEq/L (ref 19–32)
Chloride: 94 mEq/L — ABNORMAL LOW (ref 96–112)
Creatinine, Ser: 1.27 mg/dL (ref 0.50–1.35)
Glucose, Bld: 144 mg/dL — ABNORMAL HIGH (ref 70–99)

## 2013-05-14 LAB — HEPATIC FUNCTION PANEL
Alkaline Phosphatase: 77 U/L (ref 39–117)
Bilirubin, Direct: 0.2 mg/dL (ref 0.0–0.3)
Total Bilirubin: 0.8 mg/dL (ref 0.3–1.2)

## 2013-05-14 LAB — CBC
HCT: 34.7 % — ABNORMAL LOW (ref 39.0–52.0)
MCH: 34.4 pg — ABNORMAL HIGH (ref 26.0–34.0)
MCHC: 34.6 g/dL (ref 30.0–36.0)
MCV: 99.4 fL (ref 78.0–100.0)
RDW: 19.1 % — ABNORMAL HIGH (ref 11.5–15.5)

## 2013-05-14 MED ORDER — HYDROCODONE-ACETAMINOPHEN 5-325 MG PO TABS
1.0000 | ORAL_TABLET | ORAL | Status: DC | PRN
  Administered 2013-05-14: 1 via ORAL
  Administered 2013-05-14 – 2013-05-15 (×3): 2 via ORAL
  Filled 2013-05-14 (×2): qty 2
  Filled 2013-05-14: qty 1
  Filled 2013-05-14: qty 2

## 2013-05-14 NOTE — Progress Notes (Signed)
2 Days Post-Op  Subjective: Less incisional pain today. No nausea or vomiting. Did have a bowel movement yesterday.  Objective: Vital signs in last 24 hours: Temp:  [97.5 F (36.4 C)-98.1 F (36.7 C)] 98.1 F (36.7 C) (05/23 0422) Pulse Rate:  [87-111] 111 (05/23 0912) Resp:  [20] 20 (05/23 0422) BP: (87-104)/(44-56) 98/44 mmHg (05/23 0912) SpO2:  [93 %-96 %] 96 % (05/23 0422) Last BM Date: 05/12/13  Intake/Output from previous day: 05/22 0701 - 05/23 0700 In: 550 [P.O.:480] Out: 1905 [Urine:1450; Drains:455] Intake/Output this shift: Total I/O In: -  Out: 20 [Drains:20]  General appearance: alert, cooperative and no distress Resp: clear to auscultation bilaterally Cardio: regular rate and rhythm, S1, S2 normal, no murmur, click, rub or gallop GI: Soft. Dressings intact. JP drainage serosanguineous in nature.  Lab Results:   Recent Labs  05/13/13 0455 05/14/13 0459  WBC 14.0* 19.8*  HGB 11.6* 12.0*  HCT 34.0* 34.7*  PLT 378 467*   BMET  Recent Labs  05/13/13 0455 05/14/13 0459  NA 136 138  K 3.4* 3.7  CL 93* 94*  CO2 33* 30  GLUCOSE 104* 144*  BUN 27* 22  CREATININE 1.31 1.27  CALCIUM 9.0 9.4   PT/INR No results found for this basename: LABPROT, INR,  in the last 72 hours  Studies/Results: No results found.  Anti-infectives: Anti-infectives   Start     Dose/Rate Route Frequency Ordered Stop   05/12/13 1300  piperacillin-tazobactam (ZOSYN) IVPB 3.375 g     3.375 g 12.5 mL/hr over 240 Minutes Intravenous Every 8 hours 05/12/13 1241     05/12/13 0700  clindamycin (CLEOCIN) IVPB 900 mg  Status:  Discontinued     900 mg 100 mL/hr over 30 Minutes Intravenous  Once 05/12/13 0650 05/12/13 1130      Assessment/Plan: s/p Procedure(s): LAPAROSCOPIC CHOLECYSTECTOMY Impression: Improving, postoperative day 2. Still has a significant leukocytosis. His liver enzyme tests are elevated as compared to yesterday. His bilirubin is remaining within normal  limits. We'll monitor for retained common duct stones given the size of the cystic duct. Will advance diet. Continue IV Zosyn.  LOS: 2 days    Terri Rorrer A 05/14/2013

## 2013-05-15 LAB — CBC
Platelets: 438 10*3/uL — ABNORMAL HIGH (ref 150–400)
RBC: 3.06 MIL/uL — ABNORMAL LOW (ref 4.22–5.81)
RDW: 19.4 % — ABNORMAL HIGH (ref 11.5–15.5)
WBC: 16.2 10*3/uL — ABNORMAL HIGH (ref 4.0–10.5)

## 2013-05-15 LAB — HEPATIC FUNCTION PANEL
Albumin: 2.9 g/dL — ABNORMAL LOW (ref 3.5–5.2)
Alkaline Phosphatase: 63 U/L (ref 39–117)
Indirect Bilirubin: 0.4 mg/dL (ref 0.3–0.9)
Total Protein: 7 g/dL (ref 6.0–8.3)

## 2013-05-15 LAB — BASIC METABOLIC PANEL
CO2: 28 mEq/L (ref 19–32)
Calcium: 8.8 mg/dL (ref 8.4–10.5)
Chloride: 93 mEq/L — ABNORMAL LOW (ref 96–112)
GFR calc Af Amer: 60 mL/min — ABNORMAL LOW (ref >90)
Sodium: 135 mEq/L (ref 135–145)

## 2013-05-15 NOTE — Progress Notes (Signed)
3 Days Post-Op  Subjective: Feeling much better. Mild incisional pain noted. Appetite has improved.  Objective: Vital signs in last 24 hours: Temp:  [98 F (36.7 C)-98.7 F (37.1 C)] 98.7 F (37.1 C) (05/24 0518) Pulse Rate:  [97-111] 97 (05/24 0518) Resp:  [20-22] 20 (05/24 0518) BP: (95-114)/(44-72) 111/72 mmHg (05/24 0518) SpO2:  [91 %-97 %] 91 % (05/24 0518) Last BM Date: 05/13/13  Intake/Output from previous day: 05/23 0701 - 05/24 0700 In: 3888.3 [P.O.:720; I.V.:2868.3; IV Piggyback:300] Out: 965 [Urine:850; Drains:115] Intake/Output this shift:    General appearance: alert, cooperative and no distress Resp: clear to auscultation bilaterally Cardio: regular rate and rhythm, S1, S2 normal, no murmur, click, rub or gallop GI: Soft. Dressings dry and intact. JP drainage serosanguineous with minimal bilious fluid present.  Lab Results:   Recent Labs  05/14/13 0459 05/15/13 0710  WBC 19.8* 16.2*  HGB 12.0* 10.6*  HCT 34.7* 30.0*  PLT 467* 438*   BMET  Recent Labs  05/14/13 0459 05/15/13 0710  NA 138 135  K 3.7 3.4*  CL 94* 93*  CO2 30 28  GLUCOSE 144* 127*  BUN 22 26*  CREATININE 1.27 1.39*  CALCIUM 9.4 8.8   PT/INR No results found for this basename: LABPROT, INR,  in the last 72 hours  Studies/Results: No results found.  Anti-infectives: Anti-infectives   Start     Dose/Rate Route Frequency Ordered Stop   05/12/13 1300  piperacillin-tazobactam (ZOSYN) IVPB 3.375 g     3.375 g 12.5 mL/hr over 240 Minutes Intravenous Every 8 hours 05/12/13 1241     05/12/13 0700  clindamycin (CLEOCIN) IVPB 900 mg  Status:  Discontinued     900 mg 100 mL/hr over 30 Minutes Intravenous  Once 05/12/13 0650 05/12/13 1130      Assessment/Plan: s/p Procedure(s): LAPAROSCOPIC CHOLECYSTECTOMY Impression: Patient continues to improve. His leukocytosis is resolving. Liver enzyme tests are also improving. He has been advanced to regular diet. Anticipate discharge in  next 24-48 hours if leukocytosis continues to resolve.  LOS: 3 days    Sergio Schroeder A 05/15/2013

## 2013-05-16 LAB — CBC
HCT: 28.1 % — ABNORMAL LOW (ref 39.0–52.0)
Hemoglobin: 9.8 g/dL — ABNORMAL LOW (ref 13.0–17.0)
MCV: 98.3 fL (ref 78.0–100.0)
RDW: 19.5 % — ABNORMAL HIGH (ref 11.5–15.5)
WBC: 13 10*3/uL — ABNORMAL HIGH (ref 4.0–10.5)

## 2013-05-16 MED ORDER — HYDROCODONE-ACETAMINOPHEN 5-325 MG PO TABS: 1.0000 | ORAL_TABLET | ORAL | Status: DC | PRN

## 2013-05-16 MED ORDER — AMOXICILLIN-POT CLAVULANATE 875-125 MG PO TABS: 1.0000 | ORAL_TABLET | Freq: Two times a day (BID) | ORAL | Status: DC

## 2013-05-16 NOTE — Progress Notes (Addendum)
AVS reviewed with patient.  Prescriptions provided to patient.  Pt verbalized understanding of orders and after care.  Teach back method used.  Pt's IV removed.  Site WNL.  JP Drain in place.  Pt provided return demonstration to night shift nurse for emptying drain.  Pt verbalized understanding of how to empty drain and to record measurements for follow-up appointment with Dr. Lovell Sheehan.  Pt transported by NT via w/c to main entrance for discharge.  Pt stable at time of d/c.

## 2013-05-16 NOTE — Discharge Summary (Signed)
Physician Discharge Summary  Patient ID: Sergio Schroeder MRN: [id number] DOB/AGE: 1946-06-01 67 y.o.  Admit date: 05/12/2013 Discharge date: 05/16/2013  Admission Diagnoses: Acute cholecystitis, cholelithiasis  Discharge Diagnoses: Same Active Problems:   * No active hospital problems. *   Discharged Condition: good  Hospital Course: Patient is a 67 year old white male who presented to surgery for laparoscopic cholecystectomy. He was found to have acute cholecystitis with an inflamed gallbladder. He was admitted to the hospital postoperatively for further management and treatment. He did have a leukocytosis which required IV antibiotic therapy. He did have elevated liver enzyme test, though never had an elevated bilirubin. His Jackson-Pratt drainage was serosanguineous in nature, with minimal bile present. His diet was advanced without difficulty once his bowel function returned. He is being discharged home on postoperative day 4 in good improving condition. A drain is being left in place.  Treatments: surgery: Laparoscopic cholecystectomy on 05/12/2013  Discharge Exam: Blood pressure 112/75, pulse 90, temperature 98.7 F (37.1 C), temperature source Oral, resp. rate 18, height 5\' 9"  (1.753 m), weight 120.657 kg (266 lb), SpO2 90.00%. General appearance: alert, cooperative and no distress Resp: clear to auscultation bilaterally Cardio: regular rate and rhythm, S1, S2 normal, no murmur, click, rub or gallop GI: soft, non-tender; bowel sounds normal; no masses,  no organomegaly and Incisions healing well. JP drainage serosanguineous in nature.  Disposition: Home    Medication List    TAKE these medications       amoxicillin-clavulanate 875-125 MG per tablet  Commonly known as:  AUGMENTIN  Take 1 tablet by mouth 2 (two) times daily.     HYDROcodone-acetaminophen 5-325 MG per tablet  Commonly known as:  NORCO  Take 1-2 tablets by mouth every 4 (four) hours as needed for pain.     losartan-hydrochlorothiazide 100-25 MG per tablet  Commonly known as:  HYZAAR  Take 1 tablet by mouth daily.     SAW PALMETTO PO  Take 1 tablet by mouth daily.           Follow-up Information   Follow up with Dalia Heading, MD. Schedule an appointment as soon as possible for a visit on 05/20/2013.   Contact information:   [address] [place] [address] [phone]       Signed: Franky Macho A 05/16/2013, 7:49 AM

## 2013-05-20 ENCOUNTER — Ambulatory Visit (HOSPITAL_COMMUNITY): Payer: Medicare Other | Admitting: Anesthesiology

## 2013-05-20 ENCOUNTER — Encounter (HOSPITAL_COMMUNITY): Payer: Self-pay | Admitting: *Deleted

## 2013-05-20 ENCOUNTER — Ambulatory Visit (HOSPITAL_COMMUNITY)
Admission: RE | Admit: 2013-05-20 | Discharge: 2013-05-20 | Disposition: A | Discharge status: Home / Self Care | Payer: Medicare Other | Source: Ambulatory Visit | Attending: Internal Medicine | Admitting: Internal Medicine

## 2013-05-20 ENCOUNTER — Encounter (HOSPITAL_COMMUNITY): Payer: Self-pay | Admitting: Anesthesiology

## 2013-05-20 ENCOUNTER — Other Ambulatory Visit (HOSPITAL_COMMUNITY): Payer: Self-pay | Admitting: General Surgery

## 2013-05-20 ENCOUNTER — Encounter (HOSPITAL_COMMUNITY)
Admission: RE | Disposition: A | Discharge status: Home / Self Care | Payer: Self-pay | Source: Ambulatory Visit | Attending: Internal Medicine

## 2013-05-20 ENCOUNTER — Encounter (HOSPITAL_COMMUNITY)
Admission: RE | Admit: 2013-05-20 | Discharge: 2013-05-20 | Disposition: A | Discharge status: Home / Self Care | Payer: Medicare Other | Source: Ambulatory Visit | Attending: General Surgery | Admitting: General Surgery

## 2013-05-20 ENCOUNTER — Encounter (HOSPITAL_COMMUNITY): Payer: Self-pay

## 2013-05-20 ENCOUNTER — Ambulatory Visit (HOSPITAL_COMMUNITY): Payer: Medicare Other

## 2013-05-20 DIAGNOSIS — K929 Disease of digestive system, unspecified: Secondary | ICD-10-CM | POA: Insufficient documentation

## 2013-05-20 DIAGNOSIS — Y849 Medical procedure, unspecified as the cause of abnormal reaction of the patient, or of later complication, without mention of misadventure at the time of the procedure: Secondary | ICD-10-CM | POA: Insufficient documentation

## 2013-05-20 DIAGNOSIS — Z9049 Acquired absence of other specified parts of digestive tract: Secondary | ICD-10-CM

## 2013-05-20 DIAGNOSIS — K838 Other specified diseases of biliary tract: Secondary | ICD-10-CM | POA: Insufficient documentation

## 2013-05-20 DIAGNOSIS — K8050 Calculus of bile duct without cholangitis or cholecystitis without obstruction: Secondary | ICD-10-CM

## 2013-05-20 DIAGNOSIS — I10 Essential (primary) hypertension: Secondary | ICD-10-CM | POA: Insufficient documentation

## 2013-05-20 DIAGNOSIS — R109 Unspecified abdominal pain: Secondary | ICD-10-CM | POA: Insufficient documentation

## 2013-05-20 DIAGNOSIS — K5710 Diverticulosis of small intestine without perforation or abscess without bleeding: Secondary | ICD-10-CM | POA: Insufficient documentation

## 2013-05-20 HISTORY — DX: Gastro-esophageal reflux disease without esophagitis: K21.9

## 2013-05-20 HISTORY — PX: BILIARY STENT PLACEMENT: SHX5538

## 2013-05-20 HISTORY — PX: SPHINCTEROTOMY: SHX5544

## 2013-05-20 HISTORY — PX: ERCP: SHX5425

## 2013-05-20 SURGERY — ERCP, WITH INTERVENTION IF INDICATED
Anesthesia: General

## 2013-05-20 MED ORDER — STERILE WATER FOR IRRIGATION IR SOLN
Status: DC | PRN
  Administered 2013-05-20: 15:00:00

## 2013-05-20 MED ORDER — SUCCINYLCHOLINE CHLORIDE 20 MG/ML IJ SOLN
INTRAMUSCULAR | Status: AC
  Filled 2013-05-20: qty 1

## 2013-05-20 MED ORDER — PROPOFOL 10 MG/ML IV EMUL
INTRAVENOUS | Status: AC
  Filled 2013-05-20: qty 20

## 2013-05-20 MED ORDER — SODIUM CHLORIDE 0.9 % IV SOLN
INTRAVENOUS | Status: DC | PRN
  Administered 2013-05-20: 15:00:00

## 2013-05-20 MED ORDER — ROCURONIUM BROMIDE 50 MG/5ML IV SOLN
INTRAVENOUS | Status: AC
  Filled 2013-05-20: qty 1

## 2013-05-20 MED ORDER — TECHNETIUM TC 99M MEBROFENIN IV KIT
5.0000 | PACK | Freq: Once | INTRAVENOUS | Status: AC | PRN
  Administered 2013-05-20: 5.1 via INTRAVENOUS

## 2013-05-20 MED ORDER — ONDANSETRON HCL 4 MG/2ML IJ SOLN
INTRAMUSCULAR | Status: AC
  Filled 2013-05-20: qty 2

## 2013-05-20 MED ORDER — ONDANSETRON HCL 4 MG/2ML IJ SOLN
INTRAMUSCULAR | Status: DC | PRN
  Administered 2013-05-20: 4 mg via INTRAVENOUS

## 2013-05-20 MED ORDER — MIDAZOLAM HCL 5 MG/5ML IJ SOLN
INTRAMUSCULAR | Status: DC | PRN
  Administered 2013-05-20: 2 mg via INTRAVENOUS

## 2013-05-20 MED ORDER — PROPOFOL 10 MG/ML IV BOLUS
INTRAVENOUS | Status: DC | PRN
  Administered 2013-05-20: 180 mg via INTRAVENOUS

## 2013-05-20 MED ORDER — FENTANYL CITRATE 0.05 MG/ML IJ SOLN
INTRAMUSCULAR | Status: AC
  Filled 2013-05-20: qty 2

## 2013-05-20 MED ORDER — SUCCINYLCHOLINE CHLORIDE 20 MG/ML IJ SOLN
INTRAMUSCULAR | Status: DC | PRN
  Administered 2013-05-20: 180 mg via INTRAVENOUS

## 2013-05-20 MED ORDER — MIDAZOLAM HCL 2 MG/2ML IJ SOLN
INTRAMUSCULAR | Status: AC
  Filled 2013-05-20: qty 2

## 2013-05-20 MED ORDER — LACTATED RINGERS IV SOLN
INTRAVENOUS | Status: DC
  Administered 2013-05-20: 14:00:00 via INTRAVENOUS

## 2013-05-20 MED ORDER — LIDOCAINE HCL (PF) 1 % IJ SOLN
INTRAMUSCULAR | Status: AC
  Filled 2013-05-20: qty 5

## 2013-05-20 MED ORDER — FENTANYL CITRATE 0.05 MG/ML IJ SOLN
INTRAMUSCULAR | Status: DC | PRN
  Administered 2013-05-20 (×2): 50 ug via INTRAVENOUS

## 2013-05-20 MED ORDER — ROCURONIUM BROMIDE 100 MG/10ML IV SOLN
INTRAVENOUS | Status: DC | PRN
  Administered 2013-05-20: 25 mg via INTRAVENOUS

## 2013-05-20 SURGICAL SUPPLY — 27 items
BALLN RETRIEVAL 12X15 (BALLOONS) IMPLANT
BALN RTRVL 200 6-7FR 12-15 (BALLOONS)
BASKET TRAPEZOID 3X6 (MISCELLANEOUS) IMPLANT
BASKET TRAPEZOID LITHO 2.5X5 (MISCELLANEOUS) IMPLANT
BLOCK BITE 60FR ADLT L/F BLUE (MISCELLANEOUS) ×1 IMPLANT
BSKT STON RTRVL TRAPEZOID 3X6 (MISCELLANEOUS)
DEVICE INFLATION ENCORE 26 (MISCELLANEOUS) IMPLANT
DEVICE LOCKING W-BIOPSY CAP (MISCELLANEOUS) ×2 IMPLANT
FLOOR PAD 36X40 (MISCELLANEOUS) ×2
GUIDEWIRE HYDRA JAGWIRE .35 (WIRE) IMPLANT
GUIDEWIRE JAG HINI 025X260CM (WIRE) IMPLANT
NDL HYPO 18GX1.5 BLUNT FILL (NEEDLE) IMPLANT
NEEDLE HYPO 18GX1.5 BLUNT FILL (NEEDLE) ×2 IMPLANT
PAD FLOOR 36X40 (MISCELLANEOUS) IMPLANT
SNARE ROTATE MED OVAL 20MM (MISCELLANEOUS) IMPLANT
SNARE SHORT THROW 27M MED OVAL (MISCELLANEOUS) IMPLANT
SPHINCTEROTOME AUTOTOME .25 (MISCELLANEOUS) IMPLANT
SPHINCTEROTOME HYDRATOME 44 (MISCELLANEOUS) ×3 IMPLANT
SPONGE GAUZE 4X4 12PLY (GAUZE/BANDAGES/DRESSINGS) ×1 IMPLANT
SYR 20CC LL (SYRINGE) IMPLANT
SYR 3ML LL SCALE MARK (SYRINGE) IMPLANT
SYR 50ML LL SCALE MARK (SYRINGE) ×2 IMPLANT
SYSTEM CONTINUOUS INJECTION (MISCELLANEOUS) ×2 IMPLANT
TUBING ENDO SMARTCAP PENTAX (MISCELLANEOUS) ×1 IMPLANT
WALLSTENT METAL COVERED 10X60 (STENTS) IMPLANT
WALLSTENT METAL COVERED 10X80 (STENTS) IMPLANT
WATER STERILE IRR 1000ML POUR (IV SOLUTION) ×2 IMPLANT

## 2013-05-20 NOTE — H&P (Signed)
Primary Care Physician:  Alice Reichert, MD Primary Gastroenterologist:  Dr. Jena Gauss  Pre-Procedure History & Physical: HPI:  Sergio Schroeder is a 67 y.o. male here for evaluation/management of postoperative bile leak. Status post laparoscopic cholecystectomy 8 days ago. Procedure was difficult according to Dr. Lovell Sheehan. Patient had gangrenous calculus cholecystitis. JP drain was placed. JP drainage in the 2-300 range every day. Today, scan confirms a biliary leak. Dr. Lovell Sheehan called me.  Patient has been on Augmentin since surgery. He denies fever chills. Really having minimal abdominal pain. He has been doing pretty well as an outpatient.  Past Medical History  Diagnosis Date  . Melanoma     L lateral torso  . Hypertension   . H/O renal calculi     Past Surgical History  Procedure Laterality Date  . Melanoma excision Left 08/13    [facility]  . Cystoscopy    . Extracorporeal shock wave lithotripsy    . Kidney stone surgery    . Cholecystectomy N/A 05/12/2013    Procedure: LAPAROSCOPIC CHOLECYSTECTOMY;  Surgeon: Dalia Heading, MD;  Location: [facility];  Service: General;  Laterality: N/A;    Prior to Admission medications   Medication Sig Start Date End Date Taking? Authorizing Provider  amoxicillin-clavulanate (AUGMENTIN) 875-125 MG per tablet Take 1 tablet by mouth 2 (two) times daily. 05/16/13   Dalia Heading, MD  HYDROcodone-acetaminophen (NORCO) 5-325 MG per tablet Take 1-2 tablets by mouth every 4 (four) hours as needed for pain. 05/16/13   Dalia Heading, MD  losartan-hydrochlorothiazide (HYZAAR) 100-25 MG per tablet Take 1 tablet by mouth daily.    Historical Provider, MD  Saw Palmetto, Serenoa repens, (SAW PALMETTO PO) Take 1 tablet by mouth daily.    Historical Provider, MD    Allergies as of 05/20/2013  . (No Known Allergies)    No family history on file.  History   Social History  . Marital Status: Divorced    Spouse Name: N/A    Number of Children: N/A  . Years of  Education: N/A   Occupational History  . Not on file.   Social History Main Topics  . Smoking status: Current Every Day Smoker -- 0.50 packs/day for 50 years  . Smokeless tobacco: Not on file  . Alcohol Use: 12.6 oz/week    21 Cans of beer per week  . Drug Use: Yes    Special: Marijuana     Comment: 4 times/year  . Sexually Active: Not on file   Other Topics Concern  . Not on file   Social History Narrative  . No narrative on file    Review of Systems: See HPI, otherwise negative ROS  Physical Exam: There were no vitals taken for this visit. General:   Alert,  Well-developed, well-nourished, pleasant and cooperative in NAD. Family by his sister Skin:  Intact without significant lesions or rashes. Eyes:  Sclera clear, no icterus.   Conjunctiva pink. Ears:  Normal auditory acuity. Nose:  No deformity, discharge,  or lesions. Mouth:  No deformity or lesions. Neck:  Supple; no masses or thyromegaly. No significant cervical adenopathy. Lungs:  Clear throughout to auscultation.   No wheezes, crackles, or rhonchi. No acute distress. Heart:  Regular rate and rhythm; no murmurs, clicks, rubs,  or gallops. Abdomen: Obese. Laparoscopy port sites appear to be healing well. He does have a JP drain in place with about 75 cc an bilious fluid in the bulb.  Has minimal right upper quadrant tenderness. No  obvious mass or organomegaly.  Pulses:  Normal pulses noted. Extremities:  Without clubbing or edema.  Impression/Plan: 67 year old gentleman with gangrenous cholecystitis  - status post cholecystectomy 8 days ago. Has had high JP output ever since. HIDA today confirms a bile leak.  Clinically, he is doing well. He does not appear to have bile peritonitis. I discussed this entity with the patient at some length. He needs an ERCP to obliterate the transpapillary  pressure gradient via stent placement. I suppose is possible he could have a concomitant common duct stone which may have  precipitated the leak. I discussed the approach of ERCP with or without sphincterotomy and plastic stent placement. If he has a stone, I will make every effort to remove it. He understands that placed the stent would necessitate a subsequent procedure for stent removal.The risks, benefits, limitations, alternatives, and imponderable have been reviewed with the patient. I specifically discussed a1 in 10 chance of pancreatitis, reaction to medications, bleeding, perforation and the possibility of a failed ERCP. Potential for sphincterotomy and stent placement also reviewed. Questions have been answered. All parties agreeable.

## 2013-05-20 NOTE — Anesthesia Procedure Notes (Signed)
Procedure Name: Intubation Date/Time: 05/20/2013 2:46 PM Performed by: Despina Hidden Pre-anesthesia Checklist: Emergency Drugs available, Suction available, Patient being monitored and Patient identified Patient Re-evaluated:Patient Re-evaluated prior to inductionOxygen Delivery Method: Circle system utilized Preoxygenation: Pre-oxygenation with 100% oxygen Intubation Type: IV induction, Cricoid Pressure applied and Rapid sequence Ventilation: Mask ventilation without difficulty Laryngoscope Size: Mac and 3 Grade View: Grade II Tube type: Oral Tube size: 8.0 mm Number of attempts: 2 Airway Equipment and Method: Stylet Placement Confirmation: breath sounds checked- equal and bilateral and positive ETCO2 Secured at: 21 cm Tube secured with: Tape Dental Injury: Teeth and Oropharynx as per pre-operative assessment  Difficulty Due To: Difficult Airway- due to reduced neck mobility, Difficult Airway- due to large tongue, Difficulty was anticipated and Difficult Airway- due to anterior larynx Future Recommendations: Recommend- induction with short-acting agent, and alternative techniques readily available Comments: Note that airway pre-op exam shows large ,soft pliable mass on anterior neck.Pt denies airway difficulty or discomfort.Able to view posterior vc only with vigorous cricoid pressure.

## 2013-05-20 NOTE — Progress Notes (Signed)
Patient ID: Sergio Schroeder, male   DOB: 1946-09-24, 67 y.o.   MRN: [id number] Patient awake and alert in PACU. Denies any abdominal pain, whatsoever. We'll plan to allow him to go home with standard instructions. To complete 2 more days worth of Augmentin. Clear liquids today. Advance diet as tolerated tomorrow. Repeat LFTs next week. Follow up with Dr. Lovell Sheehan. I'll arrange for stent removal in about 3 weeks. All discussed with multiple family members as well.

## 2013-05-20 NOTE — Anesthesia Postprocedure Evaluation (Signed)
  Anesthesia Post-op Note  Patient: Sergio Schroeder  Procedure(s) Performed: Procedure(s): ENDOSCOPIC RETROGRADE CHOLANGIOPANCREATOGRAPHY (ERCP) (N/A) BILIARY STENT PLACEMENT (N/A) SPHINCTEROTOMY (N/A)  Patient Location: PACU  Anesthesia Type:General  Level of Consciousness: awake, alert , oriented and patient cooperative  Airway and Oxygen Therapy: Patient Spontanous Breathing  Post-op Pain: none  Post-op Assessment: Post-op Vital signs reviewed, Patient's Cardiovascular Status Stable, Respiratory Function Stable, Patent Airway, No signs of Nausea or vomiting and Pain level controlled  Post-op Vital Signs: Reviewed and stable  Complications: No apparent anesthesia complications

## 2013-05-20 NOTE — Op Note (Signed)
Sergio Schroeder, Sergio Schroeder               ACCOUNT NO.:  [id number]  MEDICAL RECORD NO.:  [id number]  LOCATION:  [facility]                          FACILITY:  [facility]  PHYSICIAN:  R. Roetta Sessions, MD FACP FACGDATE OF BIRTH:  Oct 31, 1946  DATE OF PROCEDURE:  05/20/2013 DATE OF DISCHARGE:                              OPERATIVE REPORT   PROCEDURE PERFORMED:  ERCP with biliary sphincterotomy, stent placement, stone extraction.  INDICATIONS FOR PROCEDURES:  A 67 year old gentleman.  He underwent a laparoscopic cholecystectomy about 8 days ago for gangrenous calculous cholecystitis.  Surgery was difficult.  A JP drain was placed.  He is at persistent high output through the JP drain, although clinically he has done okay.  HIDA today demonstrated overt biliary leak.  I was consulted.  I saw the patient in short stay and offered him an ERCP with biliary decompression.  I had a lengthy discussion about the stent placement, sphincterotomy, and potential for subsequent procedure.  We talked about the risk and benefits and imponderables and alternatives of this approach.  Please see my H and P for more information.  The patient's questions have been answered.  He was agreeable.  PROCEDURE:  General endotracheal anesthesia was induced by Dr. Marcos Eke and associates.  He was placed in the semiprone position on the OR table.  The patient received oral Augmentin up until the time of the procedure today.  INSTRUMENT:  Pentax video chip system.  FINDINGS:  Cursory examination of the distal esophagus, stomach and duodenum revealed a very large duodenal diverticulum in the distal portion of the second portion of the duodenum.  Please see photos. Scope was pulled back to the short position 55 cm from the incisors. Scout films taken.  I had a difficult time finding the ampullary orifice.  There were numerous redundant folds.  After an extended period of time, I did locate the ampulla of Vater on the medial wall of  the second portion of duodenum cephalad to the diverticulum.  There was an area more distal to the ampulla, which I felt was the major papilla and did palpate this area with a guidewire and make a small,what appeared to Be, a submucosal injection in this area.  Ultimately, utilizing a Microvasive sphincterotome the ampulla was approached; using guidewire palpation, deep biliary cannulation almost immediately achieved.  Cholangiogram was performed.  There appeared to be florid extravasation of contrast in the area of the clips most likely representing a cystic duct stump leak.  The residual biliary tree was not dilated.  The ampulla was somewhat small: I planned to put a 8.5- French stent in without sphincterotomy but elected to go ahead and do a sphincterotomy to facilitate stent placement.  With a guidewire deep in the biliary tree under fluoroscopic control, the sphincterotome was pulled across the ampullary orifice in the 12 o'clock position, a 5 mm sphincterotomy was made with the Erbie unit; with this maneuver, a small cholesterol stone was delivered through the ampullary orifice. Subsequently, I railed off the sphincter tone and railed on a stent deployment catheter and railed into position 8.5, 5 cm plastic stent in good position under fluoroscopic control.  Guidewire and guiding catheter was then  removed.  There was excellent flow of bile through the side and end ports after this maneuver was performed.  The patient tolerated the procedure well, was taken to PACU in stable condition. The pancreatic duct was not injected or manipulated in any way during this procedure.  I have reviewed the films with Dr. Tyron Russell following the procedure, and the review of the static films are consistent with my real time impression.  IMPRESSION: 1. Cystic duct stump leak following laparoscopic cholecystectomy. 2. Choledocholithiasis with delivery of small cholesterol stones at     that time of  biliary sphincterotomy. 3. Status post biliary stent placement. 4. Large duodenal diverticulum 5. Assuming the patient does well in PACU today, we will allow him to     go on a clear liquid diet today.  He is to complete 2 more days of     Augmentin.  He may advance diet as tolerated tomorrow.  We will     arrange followup in the first week, along with repeat labs.  He     will see Dr. Lovell Sheehan in the next week.  We plan for stent removal     in about 3 weeks.     Jonathon Bellows, MD [facility]     RMR/MEDQ  D:  05/20/2013  T:  05/20/2013  Job:  [id number]  cc:   Dalia Heading, M.D. Fax: [phone]

## 2013-05-20 NOTE — Transfer of Care (Signed)
Immediate Anesthesia Transfer of Care Note  Patient: Sergio Schroeder  Procedure(s) Performed: Procedure(s): ENDOSCOPIC RETROGRADE CHOLANGIOPANCREATOGRAPHY (ERCP) (N/A) BILIARY STENT PLACEMENT (N/A) SPHINCTEROTOMY (N/A)  Patient Location: PACU  Anesthesia Type:General  Level of Consciousness: awake, alert  and patient cooperative  Airway & Oxygen Therapy: Patient Spontanous Breathing and Patient connected to face mask oxygen  Post-op Assessment: Report given to PACU RN, Post -op Vital signs reviewed and stable and Patient moving all extremities  Post vital signs: Reviewed and stable  Complications: No apparent anesthesia complications

## 2013-05-20 NOTE — Progress Notes (Signed)
05/20/13 1407  OBSTRUCTIVE SLEEP APNEA  Have you ever been diagnosed with sleep apnea through a sleep study? No  Do you snore loudly (loud enough to be heard through closed doors)?  1  Do you often feel tired, fatigued, or sleepy during the daytime? 0  Has anyone observed you stop breathing during your sleep? 0  Do you have, or are you being treated for high blood pressure? 1  BMI more than 35 kg/m2? 1  Age over 66 years old? 1  Neck circumference greater than 40 cm/18 inches? 1  Gender: 1  Obstructive Sleep Apnea Score 6  Score 4 or greater  Results sent to PCP

## 2013-05-20 NOTE — Anesthesia Preprocedure Evaluation (Addendum)
Anesthesia Evaluation  Patient identified by MRN, date of birth, ID band Patient awake    Reviewed: Allergy & Precautions, H&P , NPO status , Patient's Chart, lab work & pertinent test results, reviewed documented beta blocker date and time   Airway Mallampati: II TM Distance: >3 FB Neck ROM: Full    Dental  (+) Teeth Intact   Pulmonary Current Smoker,  breath sounds clear to auscultation        Cardiovascular hypertension, Pt. on medications Rhythm:Regular Rate:Normal     Neuro/Psych    GI/Hepatic GERD-  ,Last PO at 0700 today;   Endo/Other  Morbid obesity  Renal/GU      Musculoskeletal   Abdominal (+) + obese,   Peds  Hematology   Anesthesia Other Findings   Reproductive/Obstetrics                          Anesthesia Physical Anesthesia Plan  ASA: II and emergent  Anesthesia Plan: General   Post-op Pain Management:    Induction: Rapid sequence, Cricoid pressure planned and Intravenous  Airway Management Planned: Oral ETT  Additional Equipment:   Intra-op Plan:   Post-operative Plan:   Informed Consent: I have reviewed the patients History and Physical, chart, labs and discussed the procedure including the risks, benefits and alternatives for the proposed anesthesia with the patient or authorized representative who has indicated his/her understanding and acceptance.   Dental advisory given  Plan Discussed with: CRNA, Anesthesiologist and Surgeon  Anesthesia Plan Comments:         Anesthesia Quick Evaluation

## 2013-05-21 ENCOUNTER — Telehealth: Payer: Self-pay | Admitting: Internal Medicine

## 2013-05-21 ENCOUNTER — Encounter: Payer: Self-pay | Admitting: Internal Medicine

## 2013-05-21 ENCOUNTER — Encounter (HOSPITAL_COMMUNITY): Payer: Self-pay | Admitting: Internal Medicine

## 2013-05-21 NOTE — Telephone Encounter (Signed)
RMR called for [place] to make OV within 2 weeks for patient to follow up ERCP and stent removal. I could not reach patient by phone and was no VM. I mailed appt card to patient with OV for 6/16 at 11 with AS.

## 2013-05-23 NOTE — Telephone Encounter (Signed)
noted 

## 2013-05-24 ENCOUNTER — Other Ambulatory Visit: Payer: Self-pay | Admitting: Internal Medicine

## 2013-05-24 LAB — HEPATIC FUNCTION PANEL
ALT: 35 U/L (ref 0–53)
AST: 26 U/L (ref 0–37)
Total Protein: 6.7 g/dL (ref 6.0–8.3)

## 2013-06-07 ENCOUNTER — Encounter (HOSPITAL_COMMUNITY): Payer: Self-pay | Admitting: Emergency Medicine

## 2013-06-07 ENCOUNTER — Inpatient Hospital Stay (HOSPITAL_COMMUNITY)
Admission: EM | Admit: 2013-06-07 | Discharge: 2013-06-13 | DRG: 378 | Disposition: A | Discharge status: Home / Self Care | Payer: Medicare Other | Attending: Family Medicine | Admitting: Family Medicine

## 2013-06-07 ENCOUNTER — Emergency Department (HOSPITAL_COMMUNITY): Payer: Medicare Other

## 2013-06-07 ENCOUNTER — Ambulatory Visit: Payer: Medicare Other | Admitting: Gastroenterology

## 2013-06-07 DIAGNOSIS — K920 Hematemesis: Principal | ICD-10-CM

## 2013-06-07 DIAGNOSIS — K311 Adult hypertrophic pyloric stenosis: Secondary | ICD-10-CM | POA: Diagnosis present

## 2013-06-07 DIAGNOSIS — L03818 Cellulitis of other sites: Secondary | ICD-10-CM | POA: Diagnosis present

## 2013-06-07 DIAGNOSIS — Z9089 Acquired absence of other organs: Secondary | ICD-10-CM

## 2013-06-07 DIAGNOSIS — K810 Acute cholecystitis: Secondary | ICD-10-CM

## 2013-06-07 DIAGNOSIS — D649 Anemia, unspecified: Secondary | ICD-10-CM | POA: Diagnosis present

## 2013-06-07 DIAGNOSIS — K2970 Gastritis, unspecified, without bleeding: Secondary | ICD-10-CM

## 2013-06-07 DIAGNOSIS — B961 Klebsiella pneumoniae [K. pneumoniae] as the cause of diseases classified elsewhere: Secondary | ICD-10-CM | POA: Diagnosis present

## 2013-06-07 DIAGNOSIS — F17200 Nicotine dependence, unspecified, uncomplicated: Secondary | ICD-10-CM | POA: Diagnosis present

## 2013-06-07 DIAGNOSIS — I10 Essential (primary) hypertension: Secondary | ICD-10-CM | POA: Diagnosis present

## 2013-06-07 DIAGNOSIS — L02818 Cutaneous abscess of other sites: Secondary | ICD-10-CM | POA: Diagnosis present

## 2013-06-07 DIAGNOSIS — K2100 Gastro-esophageal reflux disease with esophagitis, without bleeding: Secondary | ICD-10-CM | POA: Diagnosis present

## 2013-06-07 DIAGNOSIS — Z79899 Other long term (current) drug therapy: Secondary | ICD-10-CM

## 2013-06-07 DIAGNOSIS — Z6836 Body mass index (BMI) 36.0-36.9, adult: Secondary | ICD-10-CM

## 2013-06-07 DIAGNOSIS — Z85820 Personal history of malignant melanoma of skin: Secondary | ICD-10-CM

## 2013-06-07 DIAGNOSIS — E669 Obesity, unspecified: Secondary | ICD-10-CM | POA: Diagnosis present

## 2013-06-07 HISTORY — DX: Unspecified malignant neoplasm of skin, unspecified: C44.90

## 2013-06-07 LAB — BASIC METABOLIC PANEL
Calcium: 10.6 mg/dL — ABNORMAL HIGH (ref 8.4–10.5)
GFR calc Af Amer: 64 mL/min — ABNORMAL LOW (ref >90)
GFR calc non Af Amer: 55 mL/min — ABNORMAL LOW (ref >90)
Glucose, Bld: 131 mg/dL — ABNORMAL HIGH (ref 70–99)
Potassium: 3.5 mEq/L (ref 3.5–5.1)
Sodium: 142 mEq/L (ref 135–145)

## 2013-06-07 LAB — CBC WITH DIFFERENTIAL/PLATELET
Basophils Absolute: 0 10*3/uL (ref 0.0–0.1)
Eosinophils Absolute: 0 10*3/uL (ref 0.0–0.7)
Eosinophils Relative: 0 % (ref 0–5)
Lymphs Abs: 1.3 10*3/uL (ref 0.7–4.0)
MCH: 32 pg (ref 26.0–34.0)
MCV: 94.6 fL (ref 78.0–100.0)
Neutrophils Relative %: 79 % — ABNORMAL HIGH (ref 43–77)
Platelets: 548 10*3/uL — ABNORMAL HIGH (ref 150–400)
RBC: 3.5 MIL/uL — ABNORMAL LOW (ref 4.22–5.81)
RDW: 19.7 % — ABNORMAL HIGH (ref 11.5–15.5)
WBC: 11 10*3/uL — ABNORMAL HIGH (ref 4.0–10.5)

## 2013-06-07 LAB — CBC
HCT: 32 % — ABNORMAL LOW (ref 39.0–52.0)
HCT: 29.7 % — ABNORMAL LOW (ref 39.0–52.0)
Hemoglobin: 10.6 g/dL — ABNORMAL LOW (ref 13.0–17.0)
MCH: 31.9 pg (ref 26.0–34.0)
MCH: 32.2 pg (ref 26.0–34.0)
MCHC: 33.1 g/dL (ref 30.0–36.0)
MCV: 96.7 fL (ref 78.0–100.0)
Platelets: 489 10*3/uL — ABNORMAL HIGH (ref 150–400)
RBC: 3.32 MIL/uL — ABNORMAL LOW (ref 4.22–5.81)
RDW: 20 % — ABNORMAL HIGH (ref 11.5–15.5)
WBC: 12 10*3/uL — ABNORMAL HIGH (ref 4.0–10.5)

## 2013-06-07 LAB — HEPATIC FUNCTION PANEL
AST: 16 U/L (ref 0–37)
Albumin: 3.6 g/dL (ref 3.5–5.2)
Total Bilirubin: 0.5 mg/dL (ref 0.3–1.2)
Total Protein: 8.4 g/dL — ABNORMAL HIGH (ref 6.0–8.3)

## 2013-06-07 LAB — LIPASE, BLOOD: Lipase: 24 U/L (ref 11–59)

## 2013-06-07 MED ORDER — ONDANSETRON HCL 4 MG/2ML IJ SOLN
4.0000 mg | Freq: Once | INTRAMUSCULAR | Status: AC
  Administered 2013-06-07: 4 mg via INTRAVENOUS
  Filled 2013-06-07: qty 2

## 2013-06-07 MED ORDER — SODIUM CHLORIDE 0.9 % IV SOLN
INTRAVENOUS | Status: DC
  Administered 2013-06-07 – 2013-06-12 (×9): via INTRAVENOUS

## 2013-06-07 MED ORDER — SODIUM CHLORIDE 0.9 % IV SOLN
8.0000 mg/h | INTRAVENOUS | Status: DC
  Administered 2013-06-07 – 2013-06-08 (×3): 8 mg/h via INTRAVENOUS
  Filled 2013-06-07 (×5): qty 80

## 2013-06-07 MED ORDER — NICOTINE 14 MG/24HR TD PT24
14.0000 mg | MEDICATED_PATCH | Freq: Every day | TRANSDERMAL | Status: DC
Start: 2013-06-07 — End: 2013-06-13
  Administered 2013-06-07 – 2013-06-12 (×6): 14 mg via TRANSDERMAL
  Filled 2013-06-07 (×6): qty 1

## 2013-06-07 MED ORDER — PANTOPRAZOLE SODIUM 40 MG IV SOLR
40.0000 mg | Freq: Once | INTRAVENOUS | Status: AC
  Administered 2013-06-07: 40 mg via INTRAVENOUS
  Filled 2013-06-07: qty 40

## 2013-06-07 MED ORDER — ONDANSETRON HCL 4 MG/2ML IJ SOLN: 4.0000 mg | Freq: Four times a day (QID) | INTRAMUSCULAR | Status: DC | PRN

## 2013-06-07 MED ORDER — MORPHINE SULFATE 4 MG/ML IJ SOLN
2.0000 mg | Freq: Once | INTRAMUSCULAR | Status: AC
  Administered 2013-06-07: 2 mg via INTRAVENOUS
  Filled 2013-06-07: qty 1

## 2013-06-07 MED ORDER — PANTOPRAZOLE SODIUM 40 MG IV SOLR: 40.0000 mg | Freq: Two times a day (BID) | INTRAVENOUS | Status: DC

## 2013-06-07 MED ORDER — ONDANSETRON HCL 4 MG PO TABS: 4.0000 mg | ORAL_TABLET | Freq: Four times a day (QID) | ORAL | Status: DC | PRN

## 2013-06-07 MED ORDER — SODIUM CHLORIDE 0.9 % IV SOLN
80.0000 mg | Freq: Once | INTRAVENOUS | Status: AC
  Administered 2013-06-07: 80 mg via INTRAVENOUS
  Filled 2013-06-07: qty 80

## 2013-06-07 MED ORDER — SODIUM CHLORIDE 0.9 % IV BOLUS (SEPSIS)
500.0000 mL | Freq: Once | INTRAVENOUS | Status: AC
  Administered 2013-06-07: 500 mL via INTRAVENOUS

## 2013-06-07 MED ORDER — PROMETHAZINE HCL 25 MG/ML IJ SOLN: 12.5000 mg | INTRAMUSCULAR | Status: AC

## 2013-06-07 MED ORDER — SODIUM CHLORIDE 0.9 % IV SOLN
80.0000 mg | Freq: Once | INTRAVENOUS | Status: DC
  Filled 2013-06-07: qty 80

## 2013-06-07 MED ORDER — SODIUM CHLORIDE 0.9 % IV SOLN
8.0000 mg/h | INTRAVENOUS | Status: DC
  Filled 2013-06-07 (×4): qty 80

## 2013-06-07 MED ORDER — SODIUM CHLORIDE 0.9 % IV SOLN: INTRAVENOUS | Status: DC

## 2013-06-07 MED ORDER — PANTOPRAZOLE SODIUM 40 MG IV SOLR: 40.0000 mg | Freq: Once | INTRAVENOUS | Status: DC

## 2013-06-07 MED ORDER — TRAZODONE HCL 50 MG PO TABS
50.0000 mg | ORAL_TABLET | Freq: Every evening | ORAL | Status: DC | PRN
  Administered 2013-06-07: 50 mg via ORAL
  Filled 2013-06-07: qty 1

## 2013-06-07 MED ORDER — ACETAMINOPHEN 650 MG RE SUPP: 650.0000 mg | Freq: Four times a day (QID) | RECTAL | Status: DC | PRN

## 2013-06-07 MED ORDER — ACETAMINOPHEN 325 MG PO TABS: 650.0000 mg | ORAL_TABLET | Freq: Four times a day (QID) | ORAL | Status: DC | PRN

## 2013-06-07 MED ORDER — SODIUM CHLORIDE 0.9 % IV BOLUS (SEPSIS)
1000.0000 mL | Freq: Once | INTRAVENOUS | Status: AC
  Administered 2013-06-07: 1000 mL via INTRAVENOUS

## 2013-06-07 NOTE — Consult Note (Signed)
Referring Provider: Dr. Irene Limbo  Primary Care Physician:  Alice Reichert, MD Primary Gastroenterologist:  Dr. Jena Gauss   Date of Admission: 06/07/13 Date of Consultation: 06/07/13  Reason for Consultation:  Hematemesis   HPI:  [name] is a 67 year old male presenting to the ED on 6/16 secondary to hematemesis. He was last seen by our practice several weeks ago, whereby he underwent an ERCP with sphincterotomy, stent placement, and stone extraction after evidence of bile leak s/p cholecystectomy.   Presented this admission with reports of severe indigestion starting Friday, associated with nausea and vomiting. States the "acid was like fire". Onset after eating brunswick stew.  +hiccups. Threw up multiple times prior to hematemesis. , bright red blood. No outpatient PPI. No dysphagia. Last night felt LUQ discomfort. No melena. No fever/chills. Occasional Aleve for arthritis pain. No aspirin powders. Poor appetite since cholecystectomy. Denies constipation or diarrhea.    No prior EGD. Last colonoscopy in 2004 by Dr. Karilyn Cota with few scattered diverticula at sigmoid and transverse colon. Polyps noted, path unknown.    Past Medical History  Diagnosis Date  . Melanoma     L lateral torso  . Hypertension   . H/O renal calculi   . GERD (gastroesophageal reflux disease)     Past Surgical History  Procedure Laterality Date  . Melanoma excision Left 08/13    [facility]  . Cystoscopy    . Extracorporeal shock wave lithotripsy    . Kidney stone surgery    . Cholecystectomy N/A 05/12/2013    Procedure: LAPAROSCOPIC CHOLECYSTECTOMY;  Surgeon: Dalia Heading, MD;  Location: [facility];  Service: General;  Laterality: N/A;  . Ercp N/A 05/20/2013    RMR: cystic duct stump leak s/p cholecystectomy. +choledocholithiasis, s/p sphinterotomy, stent placement, stone extraction  . Biliary stent placement N/A 05/20/2013    Procedure: BILIARY STENT PLACEMENT;  Surgeon: Corbin Ade, MD;  Location: [facility];   Service: Gastroenterology;  Laterality: N/A;  . Sphincterotomy N/A 05/20/2013    Procedure: SPHINCTEROTOMY;  Surgeon: Corbin Ade, MD;  Location: [facility];  Service: Gastroenterology;  Laterality: N/A;  . Colonoscopy  2004    Dr. Karilyn Cota: few scattered diverticula at sigmoid and transverse colon, polyps. path unknown.     Prior to Admission medications   Medication Sig Start Date End Date Taking? Authorizing Provider  losartan-hydrochlorothiazide (HYZAAR) 100-25 MG per tablet Take 1 tablet by mouth daily.   Yes Historical Provider, MD  Saw Palmetto, Serenoa repens, (SAW PALMETTO PO) Take 1 tablet by mouth daily.   Yes Historical Provider, MD    Current Facility-Administered Medications  Medication Dose Route Frequency Provider Last Rate Last Dose  . 0.9 %  sodium chloride infusion   Intravenous Continuous Standley Brooking, MD 125 mL/hr at 06/07/13 1401    . acetaminophen (TYLENOL) tablet 650 mg  650 mg Oral Q6H PRN Standley Brooking, MD       Or  . acetaminophen (TYLENOL) suppository 650 mg  650 mg Rectal Q6H PRN Standley Brooking, MD      . nicotine (NICODERM CQ - dosed in mg/24 hours) patch 14 mg  14 mg Transdermal Daily Standley Brooking, MD   14 mg at 06/07/13 1401  . ondansetron (ZOFRAN) tablet 4 mg  4 mg Oral Q6H PRN Standley Brooking, MD       Or  . ondansetron [facility]) injection 4 mg  4 mg Intravenous Q6H PRN Standley Brooking, MD      .  pantoprazole (PROTONIX) 80 mg in sodium chloride 0.9 % 250 mL infusion  8 mg/hr Intravenous Continuous Standley Brooking, MD 25 mL/hr at 06/07/13 1401 8 mg/hr at 06/07/13 1401    Allergies as of 06/07/2013  . (No Known Allergies)    Family History  Problem Relation Age of Onset  . Cancer Father   . Cancer Brother   . Colon cancer Neg Hx     History   Social History  . Marital Status: Divorced    Spouse Name: N/A    Number of Children: N/A  . Years of Education: N/A   Occupational History  . Not on file.   Social History Main  Topics  . Smoking status: Current Every Day Smoker -- 0.50 packs/day for 50 years  . Smokeless tobacco: Not on file  . Alcohol Use: 12.6 oz/week    21 Cans of beer per week     Comment: beers socially.   . Drug Use: Yes    Special: Marijuana     Comment: 5-6 times a year   . Sexually Active: Not on file   Other Topics Concern  . Not on file   Social History Narrative  . No narrative on file    Review of Systems: Gen: see HPI CV: Denies chest pain, heart palpitations, syncope, edema  Resp: Denies shortness of breath with rest, cough, wheezing GI: see HPI GU : Denies urinary burning, urinary frequency, urinary incontinence.  MS: Denies joint pain,swelling, cramping Derm: Denies rash, itching, dry skin Psych: Denies depression, anxiety,confusion, or memory loss Heme: Denies bruising, bleeding, and enlarged lymph nodes.  Physical Exam: Vital signs in last 24 hours: Temp:  [97.7 F (36.5 C)-98.3 F (36.8 C)] 97.7 F (36.5 C) (06/16 1315) Pulse Rate:  [81-96] 82 (06/16 1315) Resp:  [18-22] 20 (06/16 1241) BP: (112-127)/(64-82) 123/82 mmHg (06/16 1315) SpO2:  [93 %-100 %] 93 % (06/16 1315) Weight:  [250 lb (113.399 kg)-252 lb 4.8 oz (114.443 kg)] 252 lb 4.8 oz (114.443 kg) (06/16 1315) Last BM Date: 06/06/13 General:   Alert,  Well-developed, well-nourished, pleasant and cooperative in NAD. Obese.  Head:  Normocephalic and atraumatic. Eyes:  Sclera clear, no icterus.   Conjunctiva pink. Ears:  Normal auditory acuity. Nose:  No deformity, discharge,  or lesions. Mouth:  No deformity or lesions, dentition normal. Neck:  Supple; no masses or thyromegaly. Lungs:  Clear throughout to auscultation.   No wheezes, crackles, or rhonchi. No acute distress. Heart:  Regular rate and rhythm; no murmurs, clicks, rubs,  or gallops. Abdomen:  Obese, rounded but non-distended. Soft, +BS, no TTP. Difficult to assess HSM due to large body habitus.  Rectal:  Deferred  Msk:  Symmetrical  without gross deformities. Normal posture. Extremities:  Without clubbing or edema. Neurologic:  Alert and  oriented x4;  grossly normal neurologically. Skin:  Intact without significant lesions or rashes. Cervical Nodes:  No significant cervical adenopathy. Psych:  Alert and cooperative. Normal mood and affect.  Intake/Output from previous day:   Intake/Output this shift:    Lab Results:  Recent Labs  06/07/13 0709 06/07/13 1342  WBC 11.0* 11.3*  HGB 11.2* 10.6*  HCT 33.1* 32.0*  PLT 548* 543*   BMET  Recent Labs  06/07/13 0709  NA 142  K 3.5  CL 95*  CO2 37*  GLUCOSE 131*  BUN 25*  CREATININE 1.32  CALCIUM 10.6*   LFT  Recent Labs  06/07/13 0803  PROT 8.4*  ALBUMIN 3.6  AST 16  ALT 18  ALKPHOS 87  BILITOT 0.5  BILIDIR 0.1  IBILI 0.4    Studies/Results: Dg Abd Acute W/chest  06/07/2013   *RADIOLOGY REPORT*  Clinical Data: Hematemesis after recent ERCP and sphincterotomy.  ACUTE ABDOMEN SERIES (ABDOMEN 2 VIEW & CHEST 1 VIEW)  Comparison: Imaging from ERCP on 05/20/2013.  Findings: Chest demonstrates clear lungs.  The heart is mildly enlarged.  No pneumothorax or pleural effusions are identified.  Abdominal films show stable positioning of a biliary stent in the common bile duct.  There is gastric distention noted with air fluid levels.  No evidence of small bowel obstruction or significant ileus.  No free air is identified.  No abnormal calcifications are seen.  IMPRESSION: Gastric distention.  Stable appearance of common duct biliary stent status post ERCP with stent placement.   Original Report Authenticated By: Irish Lack, M.D.    Impression: 67 year old male admitted with acute onset nausea, vomiting, and hematemesis; he reports a history of GERD, with no outpatient PPI. Apparently, symptoms began shortly after eating brunswick stew; I question a Mallory-Weiss tear as the culprit of low-volume hematemesis in the setting of repeated vomiting. He does  admit to occasional NSAIDs for arthritic pain. Differentials also include esophagitis, gastritis, PUD. As of note, AAS reported gastric distension but no evidence of obstruction or ileus. Recommend EGD with Dr. Jena Gauss on 6/17 for further evaluation.  As of note, patient recently had ERCP (May 29) with sphincterotomy and stent placement as described above. ERCP with stent removal can be planned for July. Our office will arrange this on an outpatient basis. I discussed the risks and benefits of an upper endoscopy, and patient agrees to this. Needs routine outpatient colonoscopy, as his last lower GI evaluation was in 2004.   Plan: EGD for 6/17; will augment sedation with Phenergan 12.5 mg IV on call due to ETOH use and occasional marijuana May have sips of clears now Supportive measures NPO after midnight PPI: already started on an infusion  Nira Retort, ANP-BC [facility]  4:55 PM    LOS: 0 days    06/07/2013, 4:47 PM   Attending note:  Patient seen and examined. Plain films reviewed. States nausea has improved this afternoon. Etiology of nausea and vomiting not well defined. Protracted viral illness with exacerbation of underlying GERD remains in the differential as well. I suppose hyperemesis cannabinoid syndrome would also be in the differential.  In addition, if he continued to leak after his JP drain was pulled out,  he may have developed a biloma which could  creat a pressure effect on the gastric outlet. However, such a process would have to be large and I would expect him to be much sicker if that were to be the case. Agree with plans for an EGD tomorrow.  I will decide about further evaluation  (i.e. cross sectional imaging) once EGD has been carried out.

## 2013-06-07 NOTE — Progress Notes (Signed)
UR chart review completed.  

## 2013-06-07 NOTE — ED Notes (Signed)
Patient complaining of emesis since Friday. Reports started vomiting blood last night. Patient reports had cholecystectomy on 05/12/13 and had stent placed in bile duct on 05/20/13.

## 2013-06-07 NOTE — H&P (Signed)
History and Physical  Sergio Schroeder ZOX:[id number] DOB: 11-Dec-1946 DOA: 06/07/2013  Referring physician: Dr. Adriana Simas PCP: Alice Reichert, MD   Chief Complaint: Vomiting blood  HPI:  67 year old man presented to the emergency department with a 24-hour history of vomiting blood. Initial evaluation in the emergency department was unremarkable, patient noted be hemodynamically stable and was started on Protonix infusion and referred for admission.  Patient was in the hospital 04/2013 and underwent laparoscopy cholecystectomy for acute cholecystitis. After discharge HIDA scan confirmed bile leak and the patient was seen by Dr. Kendell Bane and underwent ERCP 5/29. He has been doing relatively well since then. 6/13 he developed some nausea and poor food tolerance. This continued over the weekend but 6/15 in the evening he started to vomit blood. First dark material, then fresh blood. In numerous episodes of vomiting overnight he reports. Today in the emergency department he had one episode of some reddish brown liquid, 10 cc. He does report a history of acid reflux and acid pain.  In the emergency department he was noted be afebrile with stable vital signs. Complete metabolic panel unremarkable. CBC stable. Hemoglobin 11.2.  Review of Systems:  Negative for fever, changes to his vision, sore throat, rash, new muscle aches, chest pain, shortness of breath, dysuria.  Positive for bleeding, nausea, vomiting, mild abdominal pain.  Past Medical History  Diagnosis Date  . Melanoma     L lateral torso  . Hypertension   . H/O renal calculi   . GERD (gastroesophageal reflux disease)     Past Surgical History  Procedure Laterality Date  . Melanoma excision Left 08/13    [facility]  . Cystoscopy    . Extracorporeal shock wave lithotripsy    . Kidney stone surgery    . Cholecystectomy N/A 05/12/2013    Procedure: LAPAROSCOPIC CHOLECYSTECTOMY;  Surgeon: Dalia Heading, MD;  Location: [facility];  Service: General;   Laterality: N/A;  . Ercp N/A 05/20/2013    Procedure: ENDOSCOPIC RETROGRADE CHOLANGIOPANCREATOGRAPHY (ERCP);  Surgeon: Corbin Ade, MD;  Location: [facility];  Service: Gastroenterology;  Laterality: N/A;  . Biliary stent placement N/A 05/20/2013    Procedure: BILIARY STENT PLACEMENT;  Surgeon: Corbin Ade, MD;  Location: [facility];  Service: Gastroenterology;  Laterality: N/A;  . Sphincterotomy N/A 05/20/2013    Procedure: SPHINCTEROTOMY;  Surgeon: Corbin Ade, MD;  Location: [facility];  Service: Gastroenterology;  Laterality: N/A;    Social History:  reports that he has been smoking.  He does not have any smokeless tobacco history on file. He reports that he drinks about 12.6 ounces of alcohol per week. He reports that he uses illicit drugs (Marijuana).  No Known Allergies  Family History  Problem Relation Age of Onset  . Cancer Father   . Cancer Brother      Prior to Admission medications   Medication Sig Start Date End Date Taking? Authorizing Provider  losartan-hydrochlorothiazide (HYZAAR) 100-25 MG per tablet Take 1 tablet by mouth daily.   Yes Historical Provider, MD  Saw Palmetto, Serenoa repens, (SAW PALMETTO PO) Take 1 tablet by mouth daily.   Yes Historical Provider, MD   Physical Exam: Filed Vitals:   06/07/13 0653 06/07/13 0917 06/07/13 1105 06/07/13 1241  BP: 112/74 123/72 118/64 127/75  Pulse: 96 87 86 81  Temp: 98.3 F (36.8 C)   98.2 F (36.8 C)  TempSrc: Oral     Resp: 22 18 20 20   Height: 5\' 9"  (1.753 m)  Weight: 113.399 kg (250 lb)     SpO2: 100% 95% 94% 94%    General:  Appears calm and comfortable. Exam and in the emergency department. Eyes: PERRL, normal lids, irises ENT: grossly normal hearing, lips & tongue Neck: no LAD, masses or thyromegaly Cardiovascular: RRR, no m/r/g. No LE edema. Respiratory: CTA bilaterally, no w/r/r. Normal respiratory effort. Abdomen: soft, no rebound or guarding. Mild left upper clot or tenderness. Skin: Incisions from  previous cholecystectomy well-healing. Musculoskeletal: grossly normal tone BUE/BLE Psychiatric: grossly normal mood and affect, speech fluent and appropriate Neurologic: grossly non-focal.  Wt Readings from Last 3 Encounters:  06/07/13 113.399 kg (250 lb)  05/20/13 117.482 kg (259 lb)  05/20/13 117.482 kg (259 lb)    Labs on Admission:  Basic Metabolic Panel:  Recent Labs Lab 06/07/13 0709  NA 142  K 3.5  CL 95*  CO2 37*  GLUCOSE 131*  BUN 25*  CREATININE 1.32  CALCIUM 10.6*    Liver Function Tests:  Recent Labs Lab 06/07/13 0803  AST 16  ALT 18  ALKPHOS 87  BILITOT 0.5  PROT 8.4*  ALBUMIN 3.6    Recent Labs Lab 06/07/13 0803  LIPASE 24   CBC:  Recent Labs Lab 06/07/13 0709  WBC 11.0*  NEUTROABS 8.7*  HGB 11.2*  HCT 33.1*  MCV 94.6  PLT 548*    Radiological Exams on Admission: Dg Abd Acute W/chest  06/07/2013   *RADIOLOGY REPORT*  Clinical Data: Hematemesis after recent ERCP and sphincterotomy.  ACUTE ABDOMEN SERIES (ABDOMEN 2 VIEW & CHEST 1 VIEW)  Comparison: Imaging from ERCP on 05/20/2013.  Findings: Chest demonstrates clear lungs.  The heart is mildly enlarged.  No pneumothorax or pleural effusions are identified.  Abdominal films show stable positioning of a biliary stent in the common bile duct.  There is gastric distention noted with air fluid levels.  No evidence of small bowel obstruction or significant ileus.  No free air is identified.  No abnormal calcifications are seen.  IMPRESSION: Gastric distention.  Stable appearance of common duct biliary stent status post ERCP with stent placement.   Original Report Authenticated By: Irish Lack, M.D.    Principal Problem:   Hematemesis   Assessment/Plan 1. Hematemesis, suspect upper GI bleed: No ongoing bleeding currently. Hemodynamic stable. Hemoglobin stable compared to previous values. Appears stable for admission to medical floor. PPI infusion. GI consultation. Differential includes  Mallory-Weiss tear, peptic ulcer disease, gastritis. 2. Gastric distention colon seen on acute abdominal series. Further investigation per GI. 3. History cholecystectomy 04/2013 with postoperative bile leak, status post ERCP with biliary sphincterotomy, stent placement, stone extraction 05/20/2013.  4. Cigarette smoker: Offer nicotine patch. 5. Patient denies alcohol abuse, monitor clinically  Code Status: Full code  Family Communication: Discussed with sisters at bedside  Disposition Plan/Anticipated LOS: Observation. 1-2 days.  Time spent: 55  minutes  Brendia Sacks, MD  [facility] Pager [phone] 06/07/2013, 1:07 PM

## 2013-06-07 NOTE — ED Provider Notes (Signed)
History     This chart was scribed for Sergio Hutching, MD, MD by Smitty Pluck, ED Scribe. The patient was seen in room APA06/APA06 and the patient's care was started at 7:32 AM.   CSN: [id number]  Arrival date & time 06/07/13  4098      Chief Complaint  Patient presents with  . Hematemesis     The history is provided by the patient and medical records. No language interpreter was used.   level V caveat for urgent need for intervention HPI Comments: Sergio Schroeder is a 67 y.o. male who presents to the Emergency Department complaining of constant, moderate epigastric pain and hematemesis onset 3 days ago. Pt states that the symptoms started after eating stew 3 days ago. He states his vomit contains dark red blood and changed to bright red. He describes the vomit contents as acid tasting. Pt had cholecystomy by Dr Lovell Sheehan on 05/12/13. Pt reports that he was admitted to hospital due to complications with 05/20/13 common bile duct stent placed by Dr. Jena Gauss. Pt denies blood in stool, fever, chills, diarrhea, weakness, cough, SOB and any other pain.    PCP is Dr. Renard Matter  Past Medical History  Diagnosis Date  . Melanoma     L lateral torso  . Hypertension   . H/O renal calculi   . GERD (gastroesophageal reflux disease)     Past Surgical History  Procedure Laterality Date  . Melanoma excision Left 08/13    [facility]  . Cystoscopy    . Extracorporeal shock wave lithotripsy    . Kidney stone surgery    . Cholecystectomy N/A 05/12/2013    Procedure: LAPAROSCOPIC CHOLECYSTECTOMY;  Surgeon: Dalia Heading, MD;  Location: [facility];  Service: General;  Laterality: N/A;  . Ercp N/A 05/20/2013    Procedure: ENDOSCOPIC RETROGRADE CHOLANGIOPANCREATOGRAPHY (ERCP);  Surgeon: Corbin Ade, MD;  Location: [facility];  Service: Gastroenterology;  Laterality: N/A;  . Biliary stent placement N/A 05/20/2013    Procedure: BILIARY STENT PLACEMENT;  Surgeon: Corbin Ade, MD;  Location: [facility];  Service:  Gastroenterology;  Laterality: N/A;  . Sphincterotomy N/A 05/20/2013    Procedure: SPHINCTEROTOMY;  Surgeon: Corbin Ade, MD;  Location: [facility];  Service: Gastroenterology;  Laterality: N/A;    History reviewed. No pertinent family history.  History  Substance Use Topics  . Smoking status: Current Every Day Smoker -- 0.50 packs/day for 50 years  . Smokeless tobacco: Not on file  . Alcohol Use: 12.6 oz/week    21 Cans of beer per week      Review of Systems  Unable to perform ROS: Acuity of condition   10 Systems reviewed and all are negative for acute change except as noted in the HPI.   Allergies  Review of patient's allergies indicates no known allergies.  Home Medications   Current Outpatient Rx  Name  Route  Sig  Dispense  Refill  . amoxicillin-clavulanate (AUGMENTIN) 875-125 MG per tablet   Oral   Take 1 tablet by mouth 2 (two) times daily.   14 tablet   0   . HYDROcodone-acetaminophen (NORCO) 5-325 MG per tablet   Oral   Take 1-2 tablets by mouth every 4 (four) hours as needed for pain.   40 tablet   0   . losartan-hydrochlorothiazide (HYZAAR) 100-25 MG per tablet   Oral   Take 1 tablet by mouth daily.         . Saw Palmetto, Serenoa repens, (  SAW PALMETTO PO)   Oral   Take 1 tablet by mouth daily.           BP 112/74  Pulse 96  Temp(Src) 98.3 F (36.8 C) (Oral)  Resp 22  Ht 5\' 9"  (1.753 m)  Wt 250 lb (113.399 kg)  BMI 36.9 kg/m2  SpO2 100%  Physical Exam  Nursing note and vitals reviewed. Constitutional: He is oriented to person, place, and time. He appears well-developed and well-nourished.  Obese Appears dehydrated  HENT:  Head: Normocephalic and atraumatic.  Eyes: Conjunctivae and EOM are normal. Pupils are equal, round, and reactive to light.  Neck: Normal range of motion. Neck supple.  Cardiovascular: Normal rate, regular rhythm and normal heart sounds.   Pulmonary/Chest: Effort normal and breath sounds normal.  Abdominal:  Soft. Bowel sounds are normal. He exhibits no distension. There is tenderness in the epigastric area. There is no rebound and no guarding.  5 surgical sites are healing   Musculoskeletal: Normal range of motion.  Neurological: He is alert and oriented to person, place, and time.  Skin: Skin is warm and dry.  Psychiatric: He has a normal mood and affect.    ED Course  Procedures (including critical care time) DIAGNOSTIC STUDIES: Oxygen Saturation is 100% on room air, normal by my interpretation.    COORDINATION OF CARE: 7:38 AM Discussed ED treatment with pt and pt agrees to IV zofran, protonix, pain medication, acute abdominal series, and labs.Pt informed that he might be having bleeding in stomach.  Medications  pantoprazole (PROTONIX) injection 40 mg (not administered)  sodium chloride 0.9 % bolus 500 mL (not administered)  sodium chloride 0.9 % bolus 500 mL (not administered)  sodium chloride 0.9 % bolus 1,000 mL (1,000 mLs Intravenous New Bag/Given 06/07/13 0715)  ondansetron (ZOFRAN) injection 4 mg (4 mg Intravenous Given 06/07/13 0717)  morphine 4 MG/ML injection 2 mg (2 mg Intravenous Given 06/07/13 0717)    Results for orders placed during the hospital encounter of 06/07/13  CBC WITH DIFFERENTIAL      Result Value Range   WBC 11.0 (*) 4.0 - 10.5 K/uL   RBC 3.50 (*) 4.22 - 5.81 MIL/uL   Hemoglobin 11.2 (*) 13.0 - 17.0 g/dL   HCT 16.1 (*) 09.6 - 04.5 %   MCV 94.6  78.0 - 100.0 fL   MCH 32.0  26.0 - 34.0 pg   MCHC 33.8  30.0 - 36.0 g/dL   RDW 40.9 (*) 81.1 - 91.4 %   Platelets 548 (*) 150 - 400 K/uL   Neutrophils Relative % 79 (*) 43 - 77 %   Neutro Abs 8.7 (*) 1.7 - 7.7 K/uL   Lymphocytes Relative 12  12 - 46 %   Lymphs Abs 1.3  0.7 - 4.0 K/uL   Monocytes Relative 9  3 - 12 %   Monocytes Absolute 1.0  0.1 - 1.0 K/uL   Eosinophils Relative 0  0 - 5 %   Eosinophils Absolute 0.0  0.0 - 0.7 K/uL   Basophils Relative 0  0 - 1 %   Basophils Absolute 0.0  0.0 - 0.1 K/uL   BASIC METABOLIC PANEL      Result Value Range   Sodium 142  135 - 145 mEq/L   Potassium 3.5  3.5 - 5.1 mEq/L   Chloride 95 (*) 96 - 112 mEq/L   CO2 37 (*) 19 - 32 mEq/L   Glucose, Bld 131 (*) 70 - 99 mg/dL   BUN  25 (*) 6 - 23 mg/dL   Creatinine, Ser 6.96  0.50 - 1.35 mg/dL   Calcium 29.5 (*) 8.4 - 10.5 mg/dL   GFR calc non Af Amer 55 (*) >90 mL/min   GFR calc Af Amer 64 (*) >90 mL/min  HEPATIC FUNCTION PANEL      Result Value Range   Total Protein 8.4 (*) 6.0 - 8.3 g/dL   Albumin 3.6  3.5 - 5.2 g/dL   AST 16  0 - 37 U/L   ALT 18  0 - 53 U/L   Alkaline Phosphatase 87  39 - 117 U/L   Total Bilirubin 0.5  0.3 - 1.2 mg/dL   Bilirubin, Direct 0.1  0.0 - 0.3 mg/dL   Indirect Bilirubin 0.4  0.3 - 0.9 mg/dL  LIPASE, BLOOD      Result Value Range   Lipase 24  11 - 59 U/L       Dg Abd Acute W/chest  06/07/2013   *RADIOLOGY REPORT*  Clinical Data: Hematemesis after recent ERCP and sphincterotomy.  ACUTE ABDOMEN SERIES (ABDOMEN 2 VIEW & CHEST 1 VIEW)  Comparison: Imaging from ERCP on 05/20/2013.  Findings: Chest demonstrates clear lungs.  The heart is mildly enlarged.  No pneumothorax or pleural effusions are identified.  Abdominal films show stable positioning of a biliary stent in the common bile duct.  There is gastric distention noted with air fluid levels.  No evidence of small bowel obstruction or significant ileus.  No free air is identified.  No abnormal calcifications are seen.  IMPRESSION: Gastric distention.  Stable appearance of common duct biliary stent status post ERCP with stent placement.   Original Report Authenticated By: Irish Lack, M.D.     No diagnosis found.  CRITICAL CARE Performed by: Sergio Schroeder Total critical care time: 30 Critical care time was exclusive of separatePatient has hemorrhagic gastritis. Patient has hemorrhagic gastritisly billable procedures and treatNo obvious black stool theing other patients. Critical care was necessary to treat or prevent  imminent or life-threatening deterioration. Critical care was time spent personally by me on the following activities: development of treatment plan with patient and/or surrogate as well as nursing, discussions with consultants, evaluation of patient's response to treatment, examination of patient, obtaining history from patient or surrogate, ordering and performing treatments and interventions, ordering and review of laboratory studies, ordering and review of radiographic studies, pulse oximetry and re-evaluation of patient's condition.  MDM    Pt has hemorrhagic gastritis.  No obvious black stool.  IVF, IV protonix.  Discussed c gen med and GI.  admit    I personally performed the services described in this documentation, which was scribed in my presence. The recorded information has been reviewed and is accurate.    Sergio Hutching, MD 06/07/13 1147

## 2013-06-07 NOTE — ED Notes (Signed)
Pt vomited approximately 10 cc reddish brown liquid emesis.

## 2013-06-08 ENCOUNTER — Encounter (HOSPITAL_COMMUNITY): Payer: Self-pay | Admitting: *Deleted

## 2013-06-08 ENCOUNTER — Encounter (HOSPITAL_COMMUNITY)
Admission: EM | Disposition: A | Discharge status: Home / Self Care | Payer: Self-pay | Source: Home / Self Care | Attending: Family Medicine

## 2013-06-08 ENCOUNTER — Observation Stay (HOSPITAL_COMMUNITY): Payer: Medicare Other

## 2013-06-08 DIAGNOSIS — K920 Hematemesis: Secondary | ICD-10-CM

## 2013-06-08 DIAGNOSIS — K311 Adult hypertrophic pyloric stenosis: Secondary | ICD-10-CM

## 2013-06-08 DIAGNOSIS — K210 Gastro-esophageal reflux disease with esophagitis: Secondary | ICD-10-CM

## 2013-06-08 HISTORY — PX: ESOPHAGOGASTRODUODENOSCOPY: SHX5428

## 2013-06-08 LAB — CBC
HCT: 29.8 % — ABNORMAL LOW (ref 39.0–52.0)
HCT: 28.7 % — ABNORMAL LOW (ref 39.0–52.0)
Hemoglobin: 10 g/dL — ABNORMAL LOW (ref 13.0–17.0)
MCH: 32.2 pg (ref 26.0–34.0)
MCH: 32.6 pg (ref 26.0–34.0)
MCH: 32.1 pg (ref 26.0–34.0)
MCHC: 33.6 g/dL (ref 30.0–36.0)
MCHC: 34.3 g/dL (ref 30.0–36.0)
MCHC: 33.4 g/dL (ref 30.0–36.0)
MCV: 96 fL (ref 78.0–100.0)
Platelets: 435 10*3/uL — ABNORMAL HIGH (ref 150–400)
Platelets: 428 10*3/uL — ABNORMAL HIGH (ref 150–400)
Platelets: 463 10*3/uL — ABNORMAL HIGH (ref 150–400)
RBC: 2.97 MIL/uL — ABNORMAL LOW (ref 4.22–5.81)
RDW: 20 % — ABNORMAL HIGH (ref 11.5–15.5)
RDW: 19.8 % — ABNORMAL HIGH (ref 11.5–15.5)
RDW: 19.9 % — ABNORMAL HIGH (ref 11.5–15.5)
WBC: 12 10*3/uL — ABNORMAL HIGH (ref 4.0–10.5)
WBC: 11.1 10*3/uL — ABNORMAL HIGH (ref 4.0–10.5)

## 2013-06-08 LAB — BASIC METABOLIC PANEL
BUN: 25 mg/dL — ABNORMAL HIGH (ref 6–23)
Calcium: 10.2 mg/dL (ref 8.4–10.5)
GFR calc Af Amer: 66 mL/min — ABNORMAL LOW (ref >90)
GFR calc non Af Amer: 57 mL/min — ABNORMAL LOW (ref >90)
Glucose, Bld: 110 mg/dL — ABNORMAL HIGH (ref 70–99)
Sodium: 141 mEq/L (ref 135–145)

## 2013-06-08 SURGERY — EGD (ESOPHAGOGASTRODUODENOSCOPY)
Anesthesia: Moderate Sedation

## 2013-06-08 MED ORDER — MIDAZOLAM HCL 5 MG/5ML IJ SOLN
INTRAMUSCULAR | Status: AC
  Filled 2013-06-08: qty 10

## 2013-06-08 MED ORDER — IOHEXOL 300 MG/ML  SOLN
100.0000 mL | Freq: Once | INTRAMUSCULAR | Status: AC | PRN
  Administered 2013-06-08: 100 mL via INTRAVENOUS

## 2013-06-08 MED ORDER — BUTAMBEN-TETRACAINE-BENZOCAINE 2-2-14 % EX AERO
INHALATION_SPRAY | CUTANEOUS | Status: DC | PRN
  Administered 2013-06-08: 2 via TOPICAL

## 2013-06-08 MED ORDER — ONDANSETRON HCL 4 MG/2ML IJ SOLN
INTRAMUSCULAR | Status: AC
  Filled 2013-06-08: qty 2

## 2013-06-08 MED ORDER — ONDANSETRON HCL 4 MG/2ML IJ SOLN
INTRAMUSCULAR | Status: DC | PRN
  Administered 2013-06-08: 4 mg via INTRAVENOUS

## 2013-06-08 MED ORDER — MIDAZOLAM HCL 5 MG/5ML IJ SOLN
INTRAMUSCULAR | Status: DC | PRN
  Administered 2013-06-08 (×2): 2 mg via INTRAVENOUS

## 2013-06-08 MED ORDER — SODIUM CHLORIDE 0.9 % IV SOLN
INTRAVENOUS | Status: DC
  Administered 2013-06-08: 16:00:00 via INTRAVENOUS

## 2013-06-08 MED ORDER — MEPERIDINE HCL 100 MG/ML IJ SOLN
INTRAMUSCULAR | Status: DC | PRN
  Administered 2013-06-08 (×2): 50 mg via INTRAVENOUS

## 2013-06-08 MED ORDER — MEPERIDINE HCL 100 MG/ML IJ SOLN
INTRAMUSCULAR | Status: AC
  Filled 2013-06-08: qty 2

## 2013-06-08 NOTE — Progress Notes (Signed)
Reviewed CT with Dr. Azucena Kuba; pt appears to have a rather large GB fossa abscess with secondary inflammation impinging on duodenum producing a GOO.  This amenable to percutaneous drainage; will set up for tomorrow. I have discussed with Dr. Lovell Sheehan and pt via telephone.

## 2013-06-08 NOTE — Care Management Note (Unsigned)
    Page 1 of 1   06/08/2013     11:56:03 AM   CARE MANAGEMENT NOTE 06/08/2013  Patient:  Sergio Schroeder, Sergio Schroeder   Account Number:  [id number]  Date Initiated:  06/08/2013  Documentation initiated by:  Sharrie Rothman  Subjective/Objective Assessment:   Pt admitted from home with GI bleed. Pt lives alone but has 2 sisters that are very active in the care of the pt. Pt is indpendent with ADL's.     Action/Plan:   No CM needs noted.   Anticipated DC Date:  06/09/2013   Anticipated DC Plan:  HOME/SELF CARE      DC Planning Services  CM consult      Choice offered to / List presented to:             Status of service:  Completed, signed off Medicare Important Message given?   (If response is "NO", the following Medicare IM given date fields will be blank) Date Medicare IM given:   Date Additional Medicare IM given:    Discharge Disposition:    Per UR Regulation:    If discussed at Long Length of Stay Meetings, dates discussed:    Comments:  06/08/13 1155 Arlyss Queen, RN BSN CM

## 2013-06-08 NOTE — Op Note (Signed)
[facility] [address] [place], [address]   ENDOSCOPY PROCEDURE REPORT  PATIENT: Sergio, Schroeder  MR#: [id number] BIRTHDATE: [birth year] , 66  yrs. old GENDER: Male ENDOSCOPIST: R.  Roetta Sessions, MD FACP FACG REFERRED BY:  Butch Penny, M.D. PROCEDURE DATE:  06/08/2013 PROCEDURE:     EGD with biopsy  INDICATIONS:     nausea and vomiting/gastric distention  INFORMED CONSENT:   The risks, benefits, limitations, alternatives and imponderables have been discussed.  The potential for biopsy, esophogeal dilation, etc. have also been reviewed.  Questions have been answered.  All parties agreeable.  Please see the history and physical in the medical record for more information.  MEDICATIONS:  Versed 4 mg IV and Demerol 100 mg IV in divided doses. Zofran 4 mg IV. Cetacaine spray  DESCRIPTION OF PROCEDURE:   The EG-[id number] ([id number])  endoscope was introduced through the mouth and advanced to the second portion of the duodenum without difficulty or limitations.  The mucosal surfaces were surveyed very carefully during advancement of the scope and upon withdrawal.  Retroflexion view of the proximal stomach and esophagogastric junction was performed.      FINDINGS:  Florid  esophageal erosions involving the distal one half the tubular esophagus. Stomach full of fluid-over 2200 cc suctioned out. Pylorus patent. About a 2 cm segment of markedly inflamed swollen duodenal mucosa noted at the junction of the bulb and second portion. This was producing significant encroachment on the lumen; gastroscope traversed this segmentwithout much difficulty. Downstream,   the plastic stent previously placed appeared  to be in satisfactory position.  THERAPEUTIC / DIAGNOSTIC MANEUVERS PERFORMED:  The area of inflamed-appearing duodenal mucosa was biopsied for histologic study   COMPLICATIONS:  None  IMPRESSION:   Gastric outlet obstruction as described above. Etiology  uncertain. Secondary  reflux esophagitis.  RECOMMENDATIONS:  NG tube decompression. CT scan of the abdomen. Followup on pathology.  Continue twice a day proton pump inhibitor.    _______________________________ R. Roetta Sessions, MD FACP [facility] eSigned:  R. Roetta Sessions, MD FACP [facility] 06/08/2013 4:40 PM     CC:  PATIENT NAME:  Sergio, Schroeder MR#: [id number]

## 2013-06-08 NOTE — Progress Notes (Signed)
Subjective: The patient feels better this morning. His had no further nausea vomiting or hematemesis. He is scheduled for EGD today. His hemoglobin is 10.0.   Objective: Vital signs in last 24 hours: Temp:  [97.7 F (36.5 C)-98.3 F (36.8 C)] 97.7 F (36.5 C) (06/16 1315) Pulse Rate:  [81-96] 82 (06/16 1315) Resp:  [18-22] 20 (06/16 1241) BP: (112-127)/(64-82) 123/82 mmHg (06/16 1315) SpO2:  [93 %-100 %] 93 % (06/16 1315) Weight:  [113.399 kg (250 lb)-114.443 kg (252 lb 4.8 oz)] 114.443 kg (252 lb 4.8 oz) (06/16 1315) Weight change: 1.043 kg (2 lb 4.8 oz) Last BM Date: 06/07/13  Intake/Output from previous day:   Intake/Output this shift:    Physical Exam: Vital signs blood pressure 123/82, pulse 82, respiration 20, 98.3 H. EENT eyes PERRLA TM negative or oropharynx benign  Neck supple no JVD or thyroid abnormalities  Lungs clear to P&A  Heart regular rhythm no murmurs  Abdomen no palpable organs or masses  Extremities free of edema  Recent Labs  06/07/13 1939 06/08/13 0119  WBC 12.0* 11.5*  HGB 9.9* 10.0*  HCT 29.7* 29.8*  PLT 489* 481*   BMET  Recent Labs  06/07/13 0709  NA 142  K 3.5  CL 95*  CO2 37*  GLUCOSE 131*  BUN 25*  CREATININE 1.32  CALCIUM 10.6*    Studies/Results: Dg Abd Acute W/chest  06/07/2013   *RADIOLOGY REPORT*  Clinical Data: Hematemesis after recent ERCP and sphincterotomy.  ACUTE ABDOMEN SERIES (ABDOMEN 2 VIEW & CHEST 1 VIEW)  Comparison: Imaging from ERCP on 05/20/2013.  Findings: Chest demonstrates clear lungs.  The heart is mildly enlarged.  No pneumothorax or pleural effusions are identified.  Abdominal films show stable positioning of a biliary stent in the common bile duct.  There is gastric distention noted with air fluid levels.  No evidence of small bowel obstruction or significant ileus.  No free air is identified.  No abnormal calcifications are seen.  IMPRESSION: Gastric distention.  Stable appearance of common duct  biliary stent status post ERCP with stent placement.   Original Report Authenticated By: Irish Lack, M.D.    Medications:  . nicotine  14 mg Transdermal Daily  . promethazine  12.5 mg Intravenous On Call    . sodium chloride 125 mL/hr at 06/07/13 1401  . sodium chloride 20 mL/hr at 06/07/13 1800  . pantoprozole (PROTONIX) infusion 8 mg/hr (06/08/13 0130)     Assessment/Plan: The patient was admitted with nausea vomiting and hematemesis. His had no further vomiting. He scheduled for upper endoscopy. We'll continue to monitor hemoglobin hematocrit   LOS: 1 day   Ketina Mars G 06/08/2013, 5:58 AM

## 2013-06-09 ENCOUNTER — Ambulatory Visit (HOSPITAL_COMMUNITY)
Admit: 2013-06-09 | Discharge: 2013-06-09 | Disposition: A | Discharge status: Home / Self Care | Payer: Medicare Other | Source: Ambulatory Visit | Attending: Internal Medicine | Admitting: Internal Medicine

## 2013-06-09 ENCOUNTER — Encounter (HOSPITAL_COMMUNITY): Payer: Self-pay | Admitting: Radiology

## 2013-06-09 ENCOUNTER — Encounter (HOSPITAL_COMMUNITY): Payer: Self-pay

## 2013-06-09 DIAGNOSIS — T8140XA Infection following a procedure, unspecified, initial encounter: Secondary | ICD-10-CM | POA: Insufficient documentation

## 2013-06-09 DIAGNOSIS — K920 Hematemesis: Secondary | ICD-10-CM

## 2013-06-09 DIAGNOSIS — Y836 Removal of other organ (partial) (total) as the cause of abnormal reaction of the patient, or of later complication, without mention of misadventure at the time of the procedure: Secondary | ICD-10-CM | POA: Insufficient documentation

## 2013-06-09 DIAGNOSIS — K810 Acute cholecystitis: Secondary | ICD-10-CM | POA: Insufficient documentation

## 2013-06-09 LAB — PROTIME-INR
INR: 1.18 (ref 0.00–1.49)
Prothrombin Time: 14.8 seconds (ref 11.6–15.2)

## 2013-06-09 LAB — CBC
HCT: 28.7 % — ABNORMAL LOW (ref 39.0–52.0)
Hemoglobin: 9.4 g/dL — ABNORMAL LOW (ref 13.0–17.0)
Hemoglobin: 9.7 g/dL — ABNORMAL LOW (ref 13.0–17.0)
MCH: 32 pg (ref 26.0–34.0)
MCHC: 33.3 g/dL (ref 30.0–36.0)
MCHC: 33.8 g/dL (ref 30.0–36.0)
MCV: 95.8 fL (ref 78.0–100.0)
Platelets: 396 10*3/uL (ref 150–400)
Platelets: 351 10*3/uL (ref 150–400)
RBC: 2.87 MIL/uL — ABNORMAL LOW (ref 4.22–5.81)
RBC: 3.03 MIL/uL — ABNORMAL LOW (ref 4.22–5.81)
RDW: 19.7 % — ABNORMAL HIGH (ref 11.5–15.5)
RDW: 19.7 % — ABNORMAL HIGH (ref 11.5–15.5)
WBC: 8.7 10*3/uL (ref 4.0–10.5)
WBC: 9.3 10*3/uL (ref 4.0–10.5)

## 2013-06-09 LAB — APTT: aPTT: 30 seconds (ref 24–37)

## 2013-06-09 MED ORDER — SODIUM CHLORIDE 0.9 % IV SOLN
3.0000 g | Freq: Four times a day (QID) | INTRAVENOUS | Status: DC
  Administered 2013-06-09 – 2013-06-11 (×9): 3 g via INTRAVENOUS
  Filled 2013-06-09 (×21): qty 3

## 2013-06-09 MED ORDER — MIDAZOLAM HCL 2 MG/2ML IJ SOLN
INTRAMUSCULAR | Status: AC
  Filled 2013-06-09: qty 4

## 2013-06-09 MED ORDER — FENTANYL CITRATE 0.05 MG/ML IJ SOLN
INTRAMUSCULAR | Status: AC
  Filled 2013-06-09: qty 4

## 2013-06-09 MED ORDER — MIDAZOLAM HCL 2 MG/2ML IJ SOLN
INTRAMUSCULAR | Status: AC | PRN
  Administered 2013-06-09 (×2): 1 mg via INTRAVENOUS

## 2013-06-09 MED ORDER — FENTANYL CITRATE 0.05 MG/ML IJ SOLN
INTRAMUSCULAR | Status: AC | PRN
  Administered 2013-06-09: 50 ug via INTRAVENOUS
  Administered 2013-06-09: 25 ug via INTRAVENOUS

## 2013-06-09 NOTE — H&P (Signed)
Sergio Schroeder is an 67 y.o. male.   Chief Complaint: Recent abd pain/N/V Pt with hx cholecystectomy 05/12/13 Returned to hospital with hematemesis 5/29 +Bile leak: sphincterotomy and stent placed Now with N/V/ pain CT reveals Gall Bladder Fossa abscess Scheduled now for drain placement HPI: HTN; GERD; Melanoma  Past Medical History  Diagnosis Date  . Hypertension   . H/O renal calculi   . GERD (gastroesophageal reflux disease)   . Melanoma     L lateral torso  . Skin cancer     Past Surgical History  Procedure Laterality Date  . Melanoma excision Left 08/13    [facility]  . Cystoscopy    . Extracorporeal shock wave lithotripsy    . Kidney stone surgery    . Cholecystectomy N/A 05/12/2013    Procedure: LAPAROSCOPIC CHOLECYSTECTOMY;  Surgeon: Dalia Heading, MD;  Location: [facility];  Service: General;  Laterality: N/A;  . Ercp N/A 05/20/2013    RMR: cystic duct stump leak s/p cholecystectomy. +choledocholithiasis, s/p sphinterotomy, stent placement, stone extraction  . Biliary stent placement N/A 05/20/2013    Procedure: BILIARY STENT PLACEMENT;  Surgeon: Corbin Ade, MD;  Location: [facility];  Service: Gastroenterology;  Laterality: N/A;  . Sphincterotomy N/A 05/20/2013    Procedure: SPHINCTEROTOMY;  Surgeon: Corbin Ade, MD;  Location: [facility];  Service: Gastroenterology;  Laterality: N/A;  . Colonoscopy  2004    Dr. Karilyn Cota: few scattered diverticula at sigmoid and transverse colon, polyps. path unknown.   . Eye surgery    . Eye surgery Left 35 years ago    Family History  Problem Relation Age of Onset  . Cancer Father   . Cancer Brother   . Colon cancer Neg Hx    Social History:  reports that he has been smoking.  He does not have any smokeless tobacco history on file. He reports that he drinks about 12.6 ounces of alcohol per week. He reports that he uses illicit drugs (Marijuana).  Allergies: No Known Allergies  Medications Prior to Admission  Medication Sig Dispense  Refill  . losartan-hydrochlorothiazide (HYZAAR) 100-25 MG per tablet Take 1 tablet by mouth daily.      . Saw Palmetto, Serenoa repens, (SAW PALMETTO PO) Take 1 tablet by mouth daily.        Results for orders placed during the hospital encounter of 06/07/13 (from the past 48 hour(s))  CBC     Status: Abnormal   Collection Time    06/07/13  1:42 PM      Result Value Range   WBC 11.3 (*) 4.0 - 10.5 K/uL   RBC 3.32 (*) 4.22 - 5.81 MIL/uL   Hemoglobin 10.6 (*) 13.0 - 17.0 g/dL   HCT 16.1 (*) 09.6 - 04.5 %   MCV 96.4  78.0 - 100.0 fL   MCH 31.9  26.0 - 34.0 pg   MCHC 33.1  30.0 - 36.0 g/dL   RDW 40.9 (*) 81.1 - 91.4 %   Platelets 543 (*) 150 - 400 K/uL  CBC     Status: Abnormal   Collection Time    06/07/13  7:39 PM      Result Value Range   WBC 12.0 (*) 4.0 - 10.5 K/uL   RBC 3.07 (*) 4.22 - 5.81 MIL/uL   Hemoglobin 9.9 (*) 13.0 - 17.0 g/dL   HCT 78.2 (*) 95.6 - 21.3 %   MCV 96.7  78.0 - 100.0 fL   MCH 32.2  26.0 - 34.0  pg   MCHC 33.3  30.0 - 36.0 g/dL   RDW 40.9 (*) 81.1 - 91.4 %   Platelets 489 (*) 150 - 400 K/uL  CBC     Status: Abnormal   Collection Time    06/08/13  1:19 AM      Result Value Range   WBC 11.5 (*) 4.0 - 10.5 K/uL   Comment: WHITE COUNT CONFIRMED ON SMEAR     SMEAR STAINED AND AVAILABLE FOR REVIEW   RBC 3.11 (*) 4.22 - 5.81 MIL/uL   Hemoglobin 10.0 (*) 13.0 - 17.0 g/dL   HCT 78.2 (*) 95.6 - 21.3 %   MCV 95.8  78.0 - 100.0 fL   MCH 32.2  26.0 - 34.0 pg   MCHC 33.6  30.0 - 36.0 g/dL   RDW 08.6 (*) 57.8 - 46.9 %   Platelets 481 (*) 150 - 400 K/uL  BASIC METABOLIC PANEL     Status: Abnormal   Collection Time    06/08/13  7:32 AM      Result Value Range   Sodium 141  135 - 145 mEq/L   Potassium 3.5  3.5 - 5.1 mEq/L   Chloride 101  96 - 112 mEq/L   CO2 32  19 - 32 mEq/L   Glucose, Bld 110 (*) 70 - 99 mg/dL   BUN 25 (*) 6 - 23 mg/dL   Creatinine, Ser 6.29  0.50 - 1.35 mg/dL   Calcium 52.8  8.4 - 41.3 mg/dL   GFR calc non Af Amer 57 (*) >90 mL/min    GFR calc Af Amer 66 (*) >90 mL/min   Comment:            The eGFR has been calculated     using the CKD EPI equation.     This calculation has not been     validated in all clinical     situations.     eGFR's persistently     <90 mL/min signify     possible Chronic Kidney Disease.  CBC     Status: Abnormal   Collection Time    06/08/13  7:33 AM      Result Value Range   WBC 11.4 (*) 4.0 - 10.5 K/uL   RBC 2.98 (*) 4.22 - 5.81 MIL/uL   Hemoglobin 9.7 (*) 13.0 - 17.0 g/dL   HCT 24.4 (*) 01.0 - 27.2 %   MCV 95.0  78.0 - 100.0 fL   MCH 32.6  26.0 - 34.0 pg   MCHC 34.3  30.0 - 36.0 g/dL   RDW 53.6 (*) 64.4 - 03.4 %   Platelets 435 (*) 150 - 400 K/uL  CBC     Status: Abnormal   Collection Time    06/08/13  1:21 PM      Result Value Range   WBC 12.0 (*) 4.0 - 10.5 K/uL   RBC 2.97 (*) 4.22 - 5.81 MIL/uL   Hemoglobin 9.6 (*) 13.0 - 17.0 g/dL   HCT 74.2 (*) 59.5 - 63.8 %   MCV 95.3  78.0 - 100.0 fL   MCH 32.3  26.0 - 34.0 pg   MCHC 33.9  30.0 - 36.0 g/dL   RDW 75.6 (*) 43.3 - 29.5 %   Platelets 428 (*) 150 - 400 K/uL  CBC     Status: Abnormal   Collection Time    06/08/13  7:27 PM      Result Value Range   WBC 11.1 (*) 4.0 -  10.5 K/uL   RBC 2.99 (*) 4.22 - 5.81 MIL/uL   Hemoglobin 9.6 (*) 13.0 - 17.0 g/dL   HCT 93.8 (*) 10.1 - 75.1 %   MCV 96.0  78.0 - 100.0 fL   MCH 32.1  26.0 - 34.0 pg   MCHC 33.4  30.0 - 36.0 g/dL   RDW 02.5 (*) 85.2 - 77.8 %   Platelets 463 (*) 150 - 400 K/uL  CBC     Status: Abnormal   Collection Time    06/09/13  1:21 AM      Result Value Range   WBC 7.7  4.0 - 10.5 K/uL   RBC 2.94 (*) 4.22 - 5.81 MIL/uL   Hemoglobin 9.4 (*) 13.0 - 17.0 g/dL   HCT 24.2 (*) 35.3 - 61.4 %   MCV 95.9  78.0 - 100.0 fL   MCH 32.0  26.0 - 34.0 pg   MCHC 33.3  30.0 - 36.0 g/dL   RDW 43.1 (*) 54.0 - 08.6 %   Platelets 396  150 - 400 K/uL  CBC     Status: Abnormal   Collection Time    06/09/13  6:44 AM      Result Value Range   WBC 8.7  4.0 - 10.5 K/uL   RBC 2.87  (*) 4.22 - 5.81 MIL/uL   Hemoglobin 9.2 (*) 13.0 - 17.0 g/dL   HCT 76.1 (*) 95.0 - 93.2 %   MCV 95.8  78.0 - 100.0 fL   MCH 32.1  26.0 - 34.0 pg   MCHC 33.5  30.0 - 36.0 g/dL   RDW 67.1 (*) 24.5 - 80.9 %   Platelets 351  150 - 400 K/uL  PROTIME-INR     Status: None   Collection Time    06/09/13  7:02 AM      Result Value Range   Prothrombin Time 14.8  11.6 - 15.2 seconds   INR 1.18  0.00 - 1.49  APTT     Status: None   Collection Time    06/09/13  7:02 AM      Result Value Range   aPTT 30  24 - 37 seconds   Ct Abdomen Pelvis W Contrast  06/08/2013   *RADIOLOGY REPORT*  Clinical Data: Gastric outlet obstruction with a large volume of fluid aspirated from the stomach during upper endoscopy today.  At endoscopy, there were inflammatory changes causing significant narrowing of the lumen of the proximal duodenum.  Status post cholecystectomy on 05/15/2013 with subsequent bile leak and a biliary stent placement.  CT ABDOMEN AND PELVIS WITH CONTRAST  Technique:  Multidetector CT imaging of the abdomen and pelvis was performed following the standard protocol during bolus administration of intravenous contrast.  Contrast: OMNIPAQUE IOHEXOL 300 MG/ML  SOLN  Comparison: Chest and abdomen radiographs obtained yesterday.  Findings: There is a rounded fluid and gas collection in the gallbladder fossa, measuring 5.6 x 5.5 cm in maximum dimensions on image number 33.  There is ill-defined low density in the adjacent liver.  There are also inflammatory changes with soft tissue stranding extending inferiorly from this region as well as anteriorly and medially, involving the second portion of the duodenum.  There is also an oval fluid collection along the medial aspect of the second portion of the duodenum, measuring 2.4 x 1.1 cm on axial image number 34 and 0.8 cm in cephalocaudal dimension on coronal image number 36.  There is a second, smaller fluid collection just inferior to this  on coronal image number  36, measuring 1.2 x 0.9 cm in maximum dimensions.  This is only faintly visible on axial image number 34, measuring measuring 0.7 cm in transverse diameter.  Also noted are multiple simple appearing liver cysts.  A nasogastric tube is in place with its tip in the distal stomach. Mild atelectasis at both lung bases.  Minimal right pleural fluid medially.  Unremarkable spleen, pancreas, urinary bladder prostate gland. Biliary stent in satisfactory position.  Small bilateral renal cysts.  3 mm mid to lower left renal calculus.  No ureteral calculi or hydronephrosis.  Multiple colonic diverticula.  These include a small number of diverticula in the hepatic flexure.  These are somewhat remote from the inflammatory changes.  Normal appearing appendix extending superiorly adjacent to the inferior margin of the right lobe of the liver, just inferior to the inflammatory changes.  No enlarged lymph nodes.  Mildly lobulated contours of the adrenal glands with no definite discrete adrenal mass seen.  Lumbar and lower thoracic spine degenerative changes.  These include changes of DISH.  IMPRESSION:  1.  5.6 x 5.5 cm fluid and gas collection in the gallbladder fossa with adjacent inflammatory changes, compatible with an abscess in the gallbladder fossa.  This has been discussed with Dr. Jena Gauss. 2.  The inflammatory changes involve the second portion of the duodenum with two small oval fluid collections, as described above. These could represent diverticula and/or small abscesses. 3. Mild bibasilar atelectasis and minimal right pleural fluid. 4.  Colonic diverticulosis without evidence of diverticulitis. 5.  Liver and bilateral renal cysts. 6.  3 mm nonobstructing left renal calculus. 7.  Satisfactory position of the biliary stent.   Original Report Authenticated By: Beckie Salts, M.D.    Review of Systems  Constitutional: Negative for fever.  Respiratory: Negative for cough.   Cardiovascular: Negative for chest pain.   Gastrointestinal: Positive for nausea, vomiting and abdominal pain.  Neurological: Positive for weakness.    Blood pressure 118/67, pulse 75, temperature 97.5 F (36.4 C), temperature source Oral, resp. rate 20, height 5\' 9"  (1.753 m), weight 252 lb 4.8 oz (114.443 kg), SpO2 91.00%. Physical Exam  Constitutional: He is oriented to person, place, and time.  Cardiovascular: Normal rate, regular rhythm and normal heart sounds.   No murmur heard. Respiratory: Effort normal and breath sounds normal. He has no wheezes.  GI: Soft. Bowel sounds are normal. He exhibits distension. There is tenderness.  Musculoskeletal: Normal range of motion.  Gait slow  Neurological: He is alert and oriented to person, place, and time.  Skin: Skin is warm.  Psychiatric: He has a normal mood and affect. His behavior is normal. Judgment and thought content normal.     Assessment/Plan Abd pain; N; V Recent cholecystectomy 5/21 Bile leak- stent placed 5/29 Now with GB fossa abscess Scheduled for drain now Pt aware of procedure benefits and risks and agreeable to proceed Consent signed and in chart  Rimsha Trembley A 06/09/2013, 10:25 AM

## 2013-06-09 NOTE — Progress Notes (Signed)
UR Chart Review Completed  

## 2013-06-09 NOTE — Procedures (Signed)
CT guided drain placement in gallbladder fossa abscess.  Removed 35 ml of purulent fluid.  No immediate complication.

## 2013-06-09 NOTE — Progress Notes (Signed)
Subjective:  Patient denies vomiting or abdominal pain. NGT came out last night and had to be reinserted.   Objective: Vital signs in last 24 hours: Temp:  [97.3 F (36.3 C)-97.5 F (36.4 C)] 97.5 F (36.4 C) (06/17 1520) Pulse Rate:  [68-88] 75 (06/18 0614) Resp:  [14-27] 20 (06/18 0614) BP: (101-145)/(51-84) 118/67 mmHg (06/18 0614) SpO2:  [91 %-98 %] 91 % (06/18 0614) Last BM Date: 06/07/13 General:   Alert,  Well-developed, well-nourished, pleasant and cooperative in NAD Head:  Normocephalic and atraumatic. Eyes:  Sclera clear, no icterus.  Chest: CTA bilaterally, RRR. Abdomen:  Soft, nontender and nondistended.   Normal bowel sounds, without guarding, and without rebound.   Extremities:  Without clubbing, deformity or edema. Neurologic:  Alert and  oriented x4;  grossly normal neurologically. Skin:  Intact without significant lesions or rashes. Psych:  Alert and cooperative. Normal mood and affect.  Intake/Output from previous day: 06/17 0701 - 06/18 0700 In: 5479.2 [I.V.:5479.2] Out: 150 [Urine:150] Intake/Output this shift:    Lab Results: CBC  Recent Labs  06/08/13 1927 06/09/13 0121 06/09/13 0644  WBC 11.1* 7.7 8.7  HGB 9.6* 9.4* 9.2*  HCT 28.7* 28.2* 27.5*  MCV 96.0 95.9 95.8  PLT 463* 396 351   BMET  Recent Labs  06/07/13 0709 06/08/13 0732  NA 142 141  K 3.5 3.5  CL 95* 101  CO2 37* 32  GLUCOSE 131* 110*  BUN 25* 25*  CREATININE 1.32 1.28  CALCIUM 10.6* 10.2   LFTs  Recent Labs  06/07/13 0803  BILITOT 0.5  BILIDIR 0.1  IBILI 0.4  ALKPHOS 87  AST 16  ALT 18  PROT 8.4*  ALBUMIN 3.6    Recent Labs  06/07/13 0803  LIPASE 24   PT/INR  Recent Labs  06/09/13 0702  LABPROT 14.8  INR 1.18      Imaging Studies:   Ct Abdomen Pelvis W Contrast  06/08/2013   *RADIOLOGY REPORT*  Clinical Data: Gastric outlet obstruction with a large volume of fluid aspirated from the stomach during upper endoscopy today.  At endoscopy, there  were inflammatory changes causing significant narrowing of the lumen of the proximal duodenum.  Status post cholecystectomy on 05/15/2013 with subsequent bile leak and a biliary stent placement.  CT ABDOMEN AND PELVIS WITH CONTRAST  Technique:  Multidetector CT imaging of the abdomen and pelvis was performed following the standard protocol during bolus administration of intravenous contrast.  Contrast: OMNIPAQUE IOHEXOL 300 MG/ML  SOLN  Comparison: Chest and abdomen radiographs obtained yesterday.  Findings: There is a rounded fluid and gas collection in the gallbladder fossa, measuring 5.6 x 5.5 cm in maximum dimensions on image number 33.  There is ill-defined low density in the adjacent liver.  There are also inflammatory changes with soft tissue stranding extending inferiorly from this region as well as anteriorly and medially, involving the second portion of the duodenum.  There is also an oval fluid collection along the medial aspect of the second portion of the duodenum, measuring 2.4 x 1.1 cm on axial image number 34 and 0.8 cm in cephalocaudal dimension on coronal image number 36.  There is a second, smaller fluid collection just inferior to this on coronal image number 36, measuring 1.2 x 0.9 cm in maximum dimensions.  This is only faintly visible on axial image number 34, measuring measuring 0.7 cm in transverse diameter.  Also noted are multiple simple appearing liver cysts.  A nasogastric tube is in place  with its tip in the distal stomach. Mild atelectasis at both lung bases.  Minimal right pleural fluid medially.  Unremarkable spleen, pancreas, urinary bladder prostate gland. Biliary stent in satisfactory position.  Small bilateral renal cysts.  3 mm mid to lower left renal calculus.  No ureteral calculi or hydronephrosis.  Multiple colonic diverticula.  These include a small number of diverticula in the hepatic flexure.  These are somewhat remote from the inflammatory changes.  Normal appearing  appendix extending superiorly adjacent to the inferior margin of the right lobe of the liver, just inferior to the inflammatory changes.  No enlarged lymph nodes.  Mildly lobulated contours of the adrenal glands with no definite discrete adrenal mass seen.  Lumbar and lower thoracic spine degenerative changes.  These include changes of DISH.  IMPRESSION:  1.  5.6 x 5.5 cm fluid and gas collection in the gallbladder fossa with adjacent inflammatory changes, compatible with an abscess in the gallbladder fossa.  This has been discussed with Dr. Jena Gauss. 2.  The inflammatory changes involve the second portion of the duodenum with two small oval fluid collections, as described above. These could represent diverticula and/or small abscesses. 3. Mild bibasilar atelectasis and minimal right pleural fluid. 4.  Colonic diverticulosis without evidence of diverticulitis. 5.  Liver and bilateral renal cysts. 6.  3 mm nonobstructing left renal calculus. 7.  Satisfactory position of the biliary stent.   Original Report Authenticated By: Beckie Salts, M.D.    Dg Abd Acute W/chest  06/07/2013   *RADIOLOGY REPORT*  Clinical Data: Hematemesis after recent ERCP and sphincterotomy.  ACUTE ABDOMEN SERIES (ABDOMEN 2 VIEW & CHEST 1 VIEW)  Comparison: Imaging from ERCP on 05/20/2013.  Findings: Chest demonstrates clear lungs.  The heart is mildly enlarged.  No pneumothorax or pleural effusions are identified.  Abdominal films show stable positioning of a biliary stent in the common bile duct.  There is gastric distention noted with air fluid levels.  No evidence of small bowel obstruction or significant ileus.  No free air is identified.  No abnormal calcifications are seen.  IMPRESSION: Gastric distention.  Stable appearance of common duct biliary stent status post ERCP with stent placement.   Original Report Authenticated By: Irish Lack, M.D.  [2 weeks]   Assessment: 67 year old male admitted with acute onset nausea, vomiting,  hematemesis. Recent past medical history significant for postop biliary leak requiring ERCP with sphincterotomy and stent placement on 05/20/2013. On EGD yesterday he had significant secondary reflux esophagitis involving the distal one half of the esophagus, 2200 cc of fluid suctioned from the stomach due to gastric outlet obstruction, 2 cm segment of markedly inflamed swollen duodenal mucosa at the junction of the bulb in the second portion. CT showed rather large gallbladder fossa abscess with secondary inflammation and pink in on the duodenum producing a gastric outlet obstruction. NG tube placed last night.  Plan: 1. For percutaneous drainage today, arrive at 10am. [employer] form completed. 2. Unasyn 3 g IV every 6 hours. Nursing notified of urgency. 3. Likely removal of NG tube after he returns from percutaneous drainage.   LOS: 2 days   Tana Coast  06/09/2013, 7:51 AM

## 2013-06-09 NOTE — Progress Notes (Signed)
Subjective: The patient had a relatively comfortable night. He does have NG tube in. He did have EGD yesterday by gastroenterology service. Esophageal erosions were noted and swollen Duodenal mucosa. The patient has a large gallbladder fossa abscess noted on CT. Objective: Vital signs in last 24 hours: Temp:  [97.3 F (36.3 C)-97.5 F (36.4 C)] 97.5 F (36.4 C) (06/17 1520) Pulse Rate:  [68-88] 68 (06/17 1714) Resp:  [14-27] 20 (06/17 1714) BP: (101-145)/(51-84) 101/62 mmHg (06/17 1714) SpO2:  [91 %-98 %] 92 % (06/17 1714) Weight change:  Last BM Date: 06/07/13  Intake/Output from previous day: 06/17 0701 - 06/18 0700 In: 400 [I.V.:400] Out: 150 [Urine:150] Intake/Output this shift: Total I/O In: -  Out: 150 [Urine:150]  Physical Exam: The patient is alert and oriented  Vital signs blood pressure 120 30482 respiration 20 pulse 82 temp 97.7  HEENT eyes PERRLA TM negative oropharynx benign patient does have NG tube in place  Lungs clear to P&A  Heart regular rhythm no murmurs Abdomen patient has tenderness in right upper quadrant  Recent Labs  06/08/13 1927 06/09/13 0121  WBC 11.1* 7.7  HGB 9.6* 9.4*  HCT 28.7* 28.2*  PLT 463* 396   BMET  Recent Labs  06/07/13 0709 06/08/13 0732  NA 142 141  K 3.5 3.5  CL 95* 101  CO2 37* 32  GLUCOSE 131* 110*  BUN 25* 25*  CREATININE 1.32 1.28  CALCIUM 10.6* 10.2    Studies/Results: Ct Abdomen Pelvis W Contrast  06/08/2013   *RADIOLOGY REPORT*  Clinical Data: Gastric outlet obstruction with a large volume of fluid aspirated from the stomach during upper endoscopy today.  At endoscopy, there were inflammatory changes causing significant narrowing of the lumen of the proximal duodenum.  Status post cholecystectomy on 05/15/2013 with subsequent bile leak and a biliary stent placement.  CT ABDOMEN AND PELVIS WITH CONTRAST  Technique:  Multidetector CT imaging of the abdomen and pelvis was performed following the standard  protocol during bolus administration of intravenous contrast.  Contrast: OMNIPAQUE IOHEXOL 300 MG/ML  SOLN  Comparison: Chest and abdomen radiographs obtained yesterday.  Findings: There is a rounded fluid and gas collection in the gallbladder fossa, measuring 5.6 x 5.5 cm in maximum dimensions on image number 33.  There is ill-defined low density in the adjacent liver.  There are also inflammatory changes with soft tissue stranding extending inferiorly from this region as well as anteriorly and medially, involving the second portion of the duodenum.  There is also an oval fluid collection along the medial aspect of the second portion of the duodenum, measuring 2.4 x 1.1 cm on axial image number 34 and 0.8 cm in cephalocaudal dimension on coronal image number 36.  There is a second, smaller fluid collection just inferior to this on coronal image number 36, measuring 1.2 x 0.9 cm in maximum dimensions.  This is only faintly visible on axial image number 34, measuring measuring 0.7 cm in transverse diameter.  Also noted are multiple simple appearing liver cysts.  A nasogastric tube is in place with its tip in the distal stomach. Mild atelectasis at both lung bases.  Minimal right pleural fluid medially.  Unremarkable spleen, pancreas, urinary bladder prostate gland. Biliary stent in satisfactory position.  Small bilateral renal cysts.  3 mm mid to lower left renal calculus.  No ureteral calculi or hydronephrosis.  Multiple colonic diverticula.  These include a small number of diverticula in the hepatic flexure.  These are somewhat remote from  the inflammatory changes.  Normal appearing appendix extending superiorly adjacent to the inferior margin of the right lobe of the liver, just inferior to the inflammatory changes.  No enlarged lymph nodes.  Mildly lobulated contours of the adrenal glands with no definite discrete adrenal mass seen.  Lumbar and lower thoracic spine degenerative changes.  These include  changes of DISH.  IMPRESSION:  1.  5.6 x 5.5 cm fluid and gas collection in the gallbladder fossa with adjacent inflammatory changes, compatible with an abscess in the gallbladder fossa.  This has been discussed with Dr. Jena Gauss. 2.  The inflammatory changes involve the second portion of the duodenum with two small oval fluid collections, as described above. These could represent diverticula and/or small abscesses. 3. Mild bibasilar atelectasis and minimal right pleural fluid. 4.  Colonic diverticulosis without evidence of diverticulitis. 5.  Liver and bilateral renal cysts. 6.  3 mm nonobstructing left renal calculus. 7.  Satisfactory position of the biliary stent.   Original Report Authenticated By: Beckie Salts, M.D.   Dg Abd Acute W/chest  06/07/2013   *RADIOLOGY REPORT*  Clinical Data: Hematemesis after recent ERCP and sphincterotomy.  ACUTE ABDOMEN SERIES (ABDOMEN 2 VIEW & CHEST 1 VIEW)  Comparison: Imaging from ERCP on 05/20/2013.  Findings: Chest demonstrates clear lungs.  The heart is mildly enlarged.  No pneumothorax or pleural effusions are identified.  Abdominal films show stable positioning of a biliary stent in the common bile duct.  There is gastric distention noted with air fluid levels.  No evidence of small bowel obstruction or significant ileus.  No free air is identified.  No abnormal calcifications are seen.  IMPRESSION: Gastric distention.  Stable appearance of common duct biliary stent status post ERCP with stent placement.   Original Report Authenticated By: Irish Lack, M.D.    Medications:  . nicotine  14 mg Transdermal Daily    . sodium chloride 125 mL/hr at 06/09/13 0554     Assessment/Plan: 1. The patient has large gallbladder fossa abscess and secondary inflammation in the duodenum. Plan is to send him to [facility] today for interventional radiology drainage of abscess.  2. The patient did have upper GI bleeding but this has subsided.   LOS: 2 days   Jasmaine Rochel  G 06/09/2013, 6:05 AM

## 2013-06-09 NOTE — Progress Notes (Signed)
Patient ID: Sergio Schroeder, male   DOB: Jun 07, 1946, 67 y.o.   MRN: [id number]    GB fossa abscess  Request for drain placement Reviewed by Dr Richarda Overlie  orders in for pt to come to [facility] radiology via ambulance by 1000 am Will return to [facility] after procedure

## 2013-06-09 NOTE — ED Notes (Signed)
Spoke to Charity fundraiser at [facility]- report given. [facility] dispatched to come transport him back.

## 2013-06-10 LAB — CBC
HCT: 30 % — ABNORMAL LOW (ref 39.0–52.0)
HCT: 28.1 % — ABNORMAL LOW (ref 39.0–52.0)
Hemoglobin: 9.4 g/dL — ABNORMAL LOW (ref 13.0–17.0)
Hemoglobin: 10.1 g/dL — ABNORMAL LOW (ref 13.0–17.0)
Hemoglobin: 9.4 g/dL — ABNORMAL LOW (ref 13.0–17.0)
MCH: 31.2 pg (ref 26.0–34.0)
MCHC: 33.6 g/dL (ref 30.0–36.0)
MCV: 93.4 fL (ref 78.0–100.0)
Platelets: 396 10*3/uL (ref 150–400)
RBC: 2.93 MIL/uL — ABNORMAL LOW (ref 4.22–5.81)
RBC: 3.01 MIL/uL — ABNORMAL LOW (ref 4.22–5.81)
RDW: 19.3 % — ABNORMAL HIGH (ref 11.5–15.5)
WBC: 8.5 10*3/uL (ref 4.0–10.5)
WBC: 7.7 10*3/uL (ref 4.0–10.5)
WBC: 9.4 10*3/uL (ref 4.0–10.5)

## 2013-06-10 NOTE — Progress Notes (Addendum)
Subjective:  Feels ok. No significant abdominal pain. No vomiting. NGT draining bilious fluid. JP draining serosanguineous fluid.  Objective: Vital signs in last 24 hours: Temp:  [97.3 F (36.3 C)-98.2 F (36.8 C)] 97.3 F (36.3 C) (06/19 0612) Pulse Rate:  [70-89] 70 (06/19 0612) Resp:  [12-20] 19 (06/19 0612) BP: (122-142)/(63-79) 142/71 mmHg (06/19 0612) SpO2:  [93 %-99 %] 98 % (06/19 0612) Last BM Date: 06/07/13 General:   Alert,  Well-developed, well-nourished, pleasant and cooperative in NAD. NGT bilious fluid. Head:  Normocephalic and atraumatic. Eyes:  Sclera clear, no icterus.   Abdomen:  Soft, mild mid-abd tenderness and nondistended. Normal bowel sounds, without guarding, and without rebound.  JP with serosanguineous fluid. Extremities:  Without clubbing, deformity or edema. Neurologic:  Alert and  oriented x4;  grossly normal neurologically. Skin:  Intact without significant lesions or rashes. Psych:  Alert and cooperative. Normal mood and affect.  Intake/Output from previous day: 06/18 0701 - 06/19 0700 In: 2016 [I.V.:2016] Out: 2120 [Urine:1425; Emesis/NG output:650; Drains:45] Intake/Output this shift:    Lab Results: CBC  Recent Labs  06/09/13 1950 06/10/13 0119 06/10/13 0728  WBC 9.3 8.5 7.7  HGB 9.7* 9.4* 9.3*  HCT 28.7* 28.0* 27.6*  MCV 94.7 94.6 94.2  PLT 458* 366 396   BMET  Recent Labs  06/08/13 0732  NA 141  K 3.5  CL 101  CO2 32  GLUCOSE 110*  BUN 25*  CREATININE 1.28  CALCIUM 10.2   LFTs No results found for this basename: BILITOT, BILIDIR, IBILI, ALKPHOS, AST, ALT, PROT, ALBUMIN,  in the last 72 hours No results found for this basename: LIPASE,  in the last 72 hours PT/INR  Recent Labs  06/09/13 0702  LABPROT 14.8  INR 1.18      Imaging Studies:   Ct Abdomen Pelvis W Contrast  06/08/2013   *RADIOLOGY REPORT*  Clinical Data: Gastric outlet obstruction with a large volume of fluid aspirated from the stomach during upper  endoscopy today.  At endoscopy, there were inflammatory changes causing significant narrowing of the lumen of the proximal duodenum.  Status post cholecystectomy on 05/15/2013 with subsequent bile leak and a biliary stent placement.  CT ABDOMEN AND PELVIS WITH CONTRAST  Technique:  Multidetector CT imaging of the abdomen and pelvis was performed following the standard protocol during bolus administration of intravenous contrast.  Contrast: OMNIPAQUE IOHEXOL 300 MG/ML  SOLN  Comparison: Chest and abdomen radiographs obtained yesterday.  Findings: There is a rounded fluid and gas collection in the gallbladder fossa, measuring 5.6 x 5.5 cm in maximum dimensions on image number 33.  There is ill-defined low density in the adjacent liver.  There are also inflammatory changes with soft tissue stranding extending inferiorly from this region as well as anteriorly and medially, involving the second portion of the duodenum.  There is also an oval fluid collection along the medial aspect of the second portion of the duodenum, measuring 2.4 x 1.1 cm on axial image number 34 and 0.8 cm in cephalocaudal dimension on coronal image number 36.  There is a second, smaller fluid collection just inferior to this on coronal image number 36, measuring 1.2 x 0.9 cm in maximum dimensions.  This is only faintly visible on axial image number 34, measuring measuring 0.7 cm in transverse diameter.  Also noted are multiple simple appearing liver cysts.  A nasogastric tube is in place with its tip in the distal stomach. Mild atelectasis at both lung bases.  Minimal  right pleural fluid medially.  Unremarkable spleen, pancreas, urinary bladder prostate gland. Biliary stent in satisfactory position.  Small bilateral renal cysts.  3 mm mid to lower left renal calculus.  No ureteral calculi or hydronephrosis.  Multiple colonic diverticula.  These include a small number of diverticula in the hepatic flexure.  These are somewhat remote from the  inflammatory changes.  Normal appearing appendix extending superiorly adjacent to the inferior margin of the right lobe of the liver, just inferior to the inflammatory changes.  No enlarged lymph nodes.  Mildly lobulated contours of the adrenal glands with no definite discrete adrenal mass seen.  Lumbar and lower thoracic spine degenerative changes.  These include changes of DISH.  IMPRESSION:  1.  5.6 x 5.5 cm fluid and gas collection in the gallbladder fossa with adjacent inflammatory changes, compatible with an abscess in the gallbladder fossa.  This has been discussed with Dr. Jena Gauss. 2.  The inflammatory changes involve the second portion of the duodenum with two small oval fluid collections, as described above. These could represent diverticula and/or small abscesses. 3. Mild bibasilar atelectasis and minimal right pleural fluid. 4.  Colonic diverticulosis without evidence of diverticulitis. 5.  Liver and bilateral renal cysts. 6.  3 mm nonobstructing left renal calculus. 7.  Satisfactory position of the biliary stent.   Original Report Authenticated By: Beckie Salts, M.D.    Dg Abd Acute W/chest  06/07/2013   *RADIOLOGY REPORT*  Clinical Data: Hematemesis after recent ERCP and sphincterotomy.  ACUTE ABDOMEN SERIES (ABDOMEN 2 VIEW & CHEST 1 VIEW)  Comparison: Imaging from ERCP on 05/20/2013.  Findings: Chest demonstrates clear lungs.  The heart is mildly enlarged.  No pneumothorax or pleural effusions are identified.  Abdominal films show stable positioning of a biliary stent in the common bile duct.  There is gastric distention noted with air fluid levels.  No evidence of small bowel obstruction or significant ileus.  No free air is identified.  No abnormal calcifications are seen.  IMPRESSION: Gastric distention.  Stable appearance of common duct biliary stent status post ERCP with stent placement.   Original Report Authenticated By: Irish Lack, M.D.   Ct Image Guided Drainage Percut Cath  Peritoneal  Retroperit  06/09/2013   *RADIOLOGY REPORT*  Clinical history:Post cholecystectomy and gallbladder fossa abscess.  PROCEDURE(S): CT GUIDED PLACEMENT OF DRAIN WITHIN GALLBLADDER FOSSA ABSCESS  Physician: Rachelle Hora. Henn, MD  Medications:Versed 2 mg, Fentanyl 100 mcg. A radiology nurse monitored the patient for moderate sedation.  Moderate sedation time:20 minutes  Fluoroscopy time: 7 seconds CT fluoroscopy  Procedure:Informed consent was obtained for a CT guided drain placement.  The patient was placed supine on the CT scanner. Images of the upper abdomen were obtained.  Air-fluid collection in the gallbladder fossa was identified.  The right anterior abdomen was prepped and draped in a sterile fashion.  Skin was anesthetized with lidocaine.  18 gauge needle was directed into the gallbladder fossa abscess from a transhepatic approach.  Needle placement confirmed within the fluid collection and yellow fluid was aspirated.  A stiff Amplatz wire was placed and the tract was dilated.   A 10-French multipurpose drain was reconstituted in the gallbladder fossa.  35 ml of purulent yellow fluid was aspirated. Catheter sutured to the skin and attached to a suction bulb.  Findings:Air-fluid collection in the gallbladder fossa is consistent with an abscess.  10-French drain was directed into the abscess from a transhepatic approach.  35 ml purulent fluid was removed.  Complications: None  Impression:CT guided placement of a drain into the gallbladder fossa abscess.  Fluid was sent for Gram stain and culture.   Original Report Authenticated By: Richarda Overlie, M.D.  [2 weeks]   Assessment: 66 year old male admitted with acute onset nausea, vomiting, hematemesis. Recent past medical history significant for postop biliary leak requiring ERCP with sphincterotomy and stent placement on 05/20/2013. On EGD he had significant secondary reflux esophagitis involving the distal one half of the esophagus, 2200 cc of fluid suctioned from the  stomach due to gastric outlet obstruction, 2 cm segment of markedly inflamed swollen duodenal mucosa at the junction of the bulb in the second portion. CT showed rather large gallbladder fossa abscess with secondary inflammation and pink in on the duodenum producing a gastric outlet obstruction. Percutaneous drainage yesterday. Cultures pending.   Plan: 1. Clamp NGT. Patient does not want to have it removed until he knows he does not need it. 2. Trial of clear liquids. 3. Continue Unasyn.   LOS: 3 days   Tana Coast  06/10/2013, 9:35 AM   Attending note:  Pt improved with JP placement.  Tolerating clear liquids so far today. I bowel movement.  If he tolerates cl liquids over next 24 hrs, could probably DC NG. Would suggest Dr. Lovell Sheehan stop by some time tomorrow to make recommendations on continued antibiotics and timing of JP removal.  I'll plan to remove biliary stent in about a month from now

## 2013-06-10 NOTE — Progress Notes (Signed)
Subjective: The patient is fairly comfortable this morning he does have a G-tube in which is draining and drain in right upper quadrant. This was done in [facility] yesterday. His had no further nausea vomiting or bleeding.   Objective: Vital signs in last 24 hours: Temp:  [98.2 F (36.8 C)] 98.2 F (36.8 C) (06/18 2045) Pulse Rate:  [75-89] 79 (06/18 2045) Resp:  [12-20] 20 (06/18 2045) BP: (118-140)/(63-79) 129/63 mmHg (06/18 2045) SpO2:  [91 %-99 %] 94 % (06/18 2045) Weight change:  Last BM Date: 06/07/13  Intake/Output from previous day: 06/18 0701 - 06/19 0700 In: 2016 [I.V.:2016] Out: 1890 [Urine:1200; Emesis/NG output:650; Drains:40] Intake/Output this shift: Total I/O In: -  Out: 1060 [Urine:700; Emesis/NG output:350; Drains:10]  Physical Exam: The patient is alert and oriented  Vital signs blood pressure 129/63 respiration 20 pulse 79 temp 98.2  HEENT negative eyes PERRLA patient has NG tube in place  Lungs clear to P&A  Heart regular rhythm no murmurs  Abdomen patient has percutaneous drain in right upper quadrant which is draining purulent fluid.   Recent Labs  06/09/13 1950 06/10/13 0119  WBC 9.3 8.5  HGB 9.7* 9.4*  HCT 28.7* 28.0*  PLT 458* 366   BMET  Recent Labs  06/07/13 0709 06/08/13 0732  NA 142 141  K 3.5 3.5  CL 95* 101  CO2 37* 32  GLUCOSE 131* 110*  BUN 25* 25*  CREATININE 1.32 1.28  CALCIUM 10.6* 10.2    Studies/Results: Ct Abdomen Pelvis W Contrast  06/08/2013   *RADIOLOGY REPORT*  Clinical Data: Gastric outlet obstruction with a large volume of fluid aspirated from the stomach during upper endoscopy today.  At endoscopy, there were inflammatory changes causing significant narrowing of the lumen of the proximal duodenum.  Status post cholecystectomy on 05/15/2013 with subsequent bile leak and a biliary stent placement.  CT ABDOMEN AND PELVIS WITH CONTRAST  Technique:  Multidetector CT imaging of the abdomen and pelvis was  performed following the standard protocol during bolus administration of intravenous contrast.  Contrast: OMNIPAQUE IOHEXOL 300 MG/ML  SOLN  Comparison: Chest and abdomen radiographs obtained yesterday.  Findings: There is a rounded fluid and gas collection in the gallbladder fossa, measuring 5.6 x 5.5 cm in maximum dimensions on image number 33.  There is ill-defined low density in the adjacent liver.  There are also inflammatory changes with soft tissue stranding extending inferiorly from this region as well as anteriorly and medially, involving the second portion of the duodenum.  There is also an oval fluid collection along the medial aspect of the second portion of the duodenum, measuring 2.4 x 1.1 cm on axial image number 34 and 0.8 cm in cephalocaudal dimension on coronal image number 36.  There is a second, smaller fluid collection just inferior to this on coronal image number 36, measuring 1.2 x 0.9 cm in maximum dimensions.  This is only faintly visible on axial image number 34, measuring measuring 0.7 cm in transverse diameter.  Also noted are multiple simple appearing liver cysts.  A nasogastric tube is in place with its tip in the distal stomach. Mild atelectasis at both lung bases.  Minimal right pleural fluid medially.  Unremarkable spleen, pancreas, urinary bladder prostate gland. Biliary stent in satisfactory position.  Small bilateral renal cysts.  3 mm mid to lower left renal calculus.  No ureteral calculi or hydronephrosis.  Multiple colonic diverticula.  These include a small number of diverticula in the hepatic flexure.  These  are somewhat remote from the inflammatory changes.  Normal appearing appendix extending superiorly adjacent to the inferior margin of the right lobe of the liver, just inferior to the inflammatory changes.  No enlarged lymph nodes.  Mildly lobulated contours of the adrenal glands with no definite discrete adrenal mass seen.  Lumbar and lower thoracic spine  degenerative changes.  These include changes of DISH.  IMPRESSION:  1.  5.6 x 5.5 cm fluid and gas collection in the gallbladder fossa with adjacent inflammatory changes, compatible with an abscess in the gallbladder fossa.  This has been discussed with Dr. Jena Gauss. 2.  The inflammatory changes involve the second portion of the duodenum with two small oval fluid collections, as described above. These could represent diverticula and/or small abscesses. 3. Mild bibasilar atelectasis and minimal right pleural fluid. 4.  Colonic diverticulosis without evidence of diverticulitis. 5.  Liver and bilateral renal cysts. 6.  3 mm nonobstructing left renal calculus. 7.  Satisfactory position of the biliary stent.   Original Report Authenticated By: Beckie Salts, M.D.   Ct Image Guided Drainage Percut Cath  Peritoneal Retroperit  06/09/2013   *RADIOLOGY REPORT*  Clinical history:Post cholecystectomy and gallbladder fossa abscess.  PROCEDURE(S): CT GUIDED PLACEMENT OF DRAIN WITHIN GALLBLADDER FOSSA ABSCESS  Physician: Rachelle Hora. Henn, MD  Medications:Versed 2 mg, Fentanyl 100 mcg. A radiology nurse monitored the patient for moderate sedation.  Moderate sedation time:20 minutes  Fluoroscopy time: 7 seconds CT fluoroscopy  Procedure:Informed consent was obtained for a CT guided drain placement.  The patient was placed supine on the CT scanner. Images of the upper abdomen were obtained.  Air-fluid collection in the gallbladder fossa was identified.  The right anterior abdomen was prepped and draped in a sterile fashion.  Skin was anesthetized with lidocaine.  18 gauge needle was directed into the gallbladder fossa abscess from a transhepatic approach.  Needle placement confirmed within the fluid collection and yellow fluid was aspirated.  A stiff Amplatz wire was placed and the tract was dilated.   A 10-French multipurpose drain was reconstituted in the gallbladder fossa.  35 ml of purulent yellow fluid was aspirated. Catheter sutured  to the skin and attached to a suction bulb.  Findings:Air-fluid collection in the gallbladder fossa is consistent with an abscess.  10-French drain was directed into the abscess from a transhepatic approach.  35 ml purulent fluid was removed.  Complications: None  Impression:CT guided placement of a drain into the gallbladder fossa abscess.  Fluid was sent for Gram stain and culture.   Original Report Authenticated By: Richarda Overlie, M.D.    Medications:  . ampicillin-sulbactam (UNASYN) IV  3 g Intravenous Q6H  . nicotine  14 mg Transdermal Daily    . sodium chloride 125 mL/hr at 06/09/13 0554     Assessment/Plan: 1. Gallbladder fossa abscess 2. Previous upper GI bleeding which has subsided  Plan to continue current IV antibiotics continue NG suction continued intravenous fluid and pain control. The patient will be continued continued to be seen by gastroenterology service and surgical service as needed.  LOS: 3 days   Kyandre Okray G 06/10/2013, 6:01 AM

## 2013-06-11 ENCOUNTER — Encounter: Payer: Self-pay | Admitting: Internal Medicine

## 2013-06-11 ENCOUNTER — Inpatient Hospital Stay (HOSPITAL_COMMUNITY): Payer: Medicare Other

## 2013-06-11 ENCOUNTER — Encounter (HOSPITAL_COMMUNITY): Payer: Medicare Other

## 2013-06-11 LAB — CBC
HCT: 27.6 % — ABNORMAL LOW (ref 39.0–52.0)
MCHC: 34.4 g/dL (ref 30.0–36.0)
MCV: 92.9 fL (ref 78.0–100.0)
RDW: 19.1 % — ABNORMAL HIGH (ref 11.5–15.5)

## 2013-06-11 LAB — CULTURE, ROUTINE-ABSCESS

## 2013-06-11 MED ORDER — DEXTROSE 5 % IV SOLN
1.0000 g | INTRAVENOUS | Status: DC
  Administered 2013-06-11 – 2013-06-12 (×2): 1 g via INTRAVENOUS
  Filled 2013-06-11 (×4): qty 10

## 2013-06-11 MED ORDER — SODIUM CHLORIDE 0.9 % IJ SOLN
10.0000 mL | Freq: Two times a day (BID) | INTRAMUSCULAR | Status: DC
  Administered 2013-06-11 – 2013-06-12 (×4): 10 mL

## 2013-06-11 MED ORDER — SODIUM CHLORIDE 0.9 % IJ SOLN: 10.0000 mL | INTRAMUSCULAR | Status: DC | PRN

## 2013-06-11 MED ORDER — PANTOPRAZOLE SODIUM 40 MG PO TBEC
40.0000 mg | DELAYED_RELEASE_TABLET | Freq: Two times a day (BID) | ORAL | Status: DC
  Administered 2013-06-11 – 2013-06-12 (×2): 40 mg via ORAL
  Filled 2013-06-11 (×2): qty 1

## 2013-06-11 NOTE — Progress Notes (Signed)
Subjective: The patient had a relatively comfortable night. His NG tube is draining he has a drain in right upper quadrant. He remains on IV fluids and IV antibiotics.  Objective: Vital signs in last 24 hours: Temp:  [97.3 F (36.3 C)-97.6 F (36.4 C)] 97.6 F (36.4 C) (06/19 2300) Pulse Rate:  [70-87] 87 (06/19 2300) Resp:  [18-20] 18 (06/19 2300) BP: (117-142)/(57-71) 117/57 mmHg (06/19 2300) SpO2:  [96 %-99 %] 99 % (06/19 2300) Weight change:  Last BM Date: 06/07/13  Intake/Output from previous day: 06/19 0701 - 06/20 0700 In: 4104 [P.O.:1250; I.V.:2854] Out: 1055 [Urine:875; Emesis/NG output:150; Drains:30] Intake/Output this shift: Total I/O In: -  Out: 500 [Urine:500]  Physical Exam: The patient is alert and oriented  Vital signs blood pressure 117/57 respiration 18 pulse 87 temp 97.6  HEENT negative eyes PERRLA patient has NG tube in place  Lungs clear to P&A  Heart regular rhythm no murmurs  Abdomen patient has percutaneous drain in right upper quadrant which is draining purulent fluid  Extremities free of edema   Recent Labs  06/10/13 1943 06/11/13 0116  WBC 10.6* 9.5  HGB 9.4* 9.5*  HCT 28.1* 27.6*  PLT 447* 386   BMET  Recent Labs  06/08/13 0732  NA 141  K 3.5  CL 101  CO2 32  GLUCOSE 110*  BUN 25*  CREATININE 1.28  CALCIUM 10.2    Studies/Results: Ct Image Guided Drainage Percut Cath  Peritoneal Retroperit  06/09/2013   *RADIOLOGY REPORT*  Clinical history:Post cholecystectomy and gallbladder fossa abscess.  PROCEDURE(S): CT GUIDED PLACEMENT OF DRAIN WITHIN GALLBLADDER FOSSA ABSCESS  Physician: Rachelle Hora. Henn, MD  Medications:Versed 2 mg, Fentanyl 100 mcg. A radiology nurse monitored the patient for moderate sedation.  Moderate sedation time:20 minutes  Fluoroscopy time: 7 seconds CT fluoroscopy  Procedure:Informed consent was obtained for a CT guided drain placement.  The patient was placed supine on the CT scanner. Images of the upper  abdomen were obtained.  Air-fluid collection in the gallbladder fossa was identified.  The right anterior abdomen was prepped and draped in a sterile fashion.  Skin was anesthetized with lidocaine.  18 gauge needle was directed into the gallbladder fossa abscess from a transhepatic approach.  Needle placement confirmed within the fluid collection and yellow fluid was aspirated.  A stiff Amplatz wire was placed and the tract was dilated.   A 10-French multipurpose drain was reconstituted in the gallbladder fossa.  35 ml of purulent yellow fluid was aspirated. Catheter sutured to the skin and attached to a suction bulb.  Findings:Air-fluid collection in the gallbladder fossa is consistent with an abscess.  10-French drain was directed into the abscess from a transhepatic approach.  35 ml purulent fluid was removed.  Complications: None  Impression:CT guided placement of a drain into the gallbladder fossa abscess.  Fluid was sent for Gram stain and culture.   Original Report Authenticated By: Richarda Overlie, M.D.    Medications:  . ampicillin-sulbactam (UNASYN) IV  3 g Intravenous Q6H  . nicotine  14 mg Transdermal Daily    . sodium chloride 125 mL/hr at 06/10/13 0729     Assessment/Plan: 1. Gallbladder fossa abscess  2. Previous upper GI bleeding which has subsided 3. Gastric outlet obstruction due to the swollen duodenal mucosa.  Plan to continue current IV antibiotics and nasogastric suction.  LOS: 4 days   [name] G 06/11/2013, 6:04 AM

## 2013-06-11 NOTE — Progress Notes (Signed)
Subjective:  Patient feels better. Tolerated liquids but nursing staff restarted suction on NGT after each meal. BM today, normal. Less abdominal pain.   Objective: Vital signs in last 24 hours: Temp:  [97.3 F (36.3 C)-97.9 F (36.6 C)] 97.9 F (36.6 C) (06/20 0647) Pulse Rate:  [65-87] 65 (06/20 0647) Resp:  [18-20] 20 (06/20 0647) BP: (117-132)/(57-78) 132/78 mmHg (06/20 0647) SpO2:  [96 %-99 %] 96 % (06/20 0647) Last BM Date: 06/10/13 General:   Alert,  Well-developed, well-nourished, pleasant and cooperative in NAD Head:  Normocephalic and atraumatic. Eyes:  Sclera clear, no icterus.   Abdomen:  Soft, mild ruq tenderness and nondistended.  Normal bowel sounds, without guarding, and without rebound.  JP with serosanguineous fluid.  Extremities:  Without clubbing, deformity or edema. Neurologic:  Alert and  oriented x4;  grossly normal neurologically. Skin:  Intact without significant lesions or rashes. Psych:  Alert and cooperative. Normal mood and affect.  Intake/Output from previous day: 06/19 0701 - 06/20 0700 In: 4104 [P.O.:1250; I.V.:2854] Out: 1055 [Urine:875; Emesis/NG output:150; Drains:30] Intake/Output this shift: Total I/O In: 5 [Other:5] Out: 215 [Urine:200; Drains:15]  Lab Results: CBC  Recent Labs  06/10/13 1312 06/10/13 1943 06/11/13 0116  WBC 9.4 10.6* 9.5  HGB 10.1* 9.4* 9.5*  HCT 30.0* 28.1* 27.6*  MCV 93.8 93.4 92.9  PLT 468* 447* 386   BMET No results found for this basename: NA, K, CL, CO2, GLUCOSE, BUN, CREATININE, CALCIUM,  in the last 72 hours LFTs No results found for this basename: BILITOT, BILIDIR, IBILI, ALKPHOS, AST, ALT, PROT, ALBUMIN,  in the last 72 hours No results found for this basename: LIPASE,  in the last 72 hours PT/INR  Recent Labs  06/09/13 0702  LABPROT 14.8  INR 1.18      Imaging Studies:  Ct Abdomen Pelvis W Contrast  06/08/2013   *RADIOLOGY REPORT*  Clinical Data: Gastric outlet obstruction with a large  volume of fluid aspirated from the stomach during upper endoscopy today.  At endoscopy, there were inflammatory changes causing significant narrowing of the lumen of the proximal duodenum.  Status post cholecystectomy on 05/15/2013 with subsequent bile leak and a biliary stent placement.  CT ABDOMEN AND PELVIS WITH CONTRAST  Technique:  Multidetector CT imaging of the abdomen and pelvis was performed following the standard protocol during bolus administration of intravenous contrast.  Contrast: OMNIPAQUE IOHEXOL 300 MG/ML  SOLN  Comparison: Chest and abdomen radiographs obtained yesterday.  Findings: There is a rounded fluid and gas collection in the gallbladder fossa, measuring 5.6 x 5.5 cm in maximum dimensions on image number 33.  There is ill-defined low density in the adjacent liver.  There are also inflammatory changes with soft tissue stranding extending inferiorly from this region as well as anteriorly and medially, involving the second portion of the duodenum.  There is also an oval fluid collection along the medial aspect of the second portion of the duodenum, measuring 2.4 x 1.1 cm on axial image number 34 and 0.8 cm in cephalocaudal dimension on coronal image number 36.  There is a second, smaller fluid collection just inferior to this on coronal image number 36, measuring 1.2 x 0.9 cm in maximum dimensions.  This is only faintly visible on axial image number 34, measuring measuring 0.7 cm in transverse diameter.  Also noted are multiple simple appearing liver cysts.  A nasogastric tube is in place with its tip in the distal stomach. Mild atelectasis at both lung bases.  Minimal  right pleural fluid medially.  Unremarkable spleen, pancreas, urinary bladder prostate gland. Biliary stent in satisfactory position.  Small bilateral renal cysts.  3 mm mid to lower left renal calculus.  No ureteral calculi or hydronephrosis.  Multiple colonic diverticula.  These include a small number of diverticula in  the hepatic flexure.  These are somewhat remote from the inflammatory changes.  Normal appearing appendix extending superiorly adjacent to the inferior margin of the right lobe of the liver, just inferior to the inflammatory changes.  No enlarged lymph nodes.  Mildly lobulated contours of the adrenal glands with no definite discrete adrenal mass seen.  Lumbar and lower thoracic spine degenerative changes.  These include changes of DISH.  IMPRESSION:  1.  5.6 x 5.5 cm fluid and gas collection in the gallbladder fossa with adjacent inflammatory changes, compatible with an abscess in the gallbladder fossa.  This has been discussed with Dr. Jena Gauss. 2.  The inflammatory changes involve the second portion of the duodenum with two small oval fluid collections, as described above. These could represent diverticula and/or small abscesses. 3. Mild bibasilar atelectasis and minimal right pleural fluid. 4.  Colonic diverticulosis without evidence of diverticulitis. 5.  Liver and bilateral renal cysts. 6.  3 mm nonobstructing left renal calculus. 7.  Satisfactory position of the biliary stent.   Original Report Authenticated By: Beckie Salts, M.D.    Ct Image Guided Drainage Percut Cath  Peritoneal Retroperit  06/09/2013   *RADIOLOGY REPORT*  Clinical history:Post cholecystectomy and gallbladder fossa abscess.  PROCEDURE(S): CT GUIDED PLACEMENT OF DRAIN WITHIN GALLBLADDER FOSSA ABSCESS  Physician: Rachelle Hora. Henn, MD  Medications:Versed 2 mg, Fentanyl 100 mcg. A radiology nurse monitored the patient for moderate sedation.  Moderate sedation time:20 minutes  Fluoroscopy time: 7 seconds CT fluoroscopy  Procedure:Informed consent was obtained for a CT guided drain placement.  The patient was placed supine on the CT scanner. Images of the upper abdomen were obtained.  Air-fluid collection in the gallbladder fossa was identified.  The right anterior abdomen was prepped and draped in a sterile fashion.  Skin was anesthetized with  lidocaine.  18 gauge needle was directed into the gallbladder fossa abscess from a transhepatic approach.  Needle placement confirmed within the fluid collection and yellow fluid was aspirated.  A stiff Amplatz wire was placed and the tract was dilated.   A 10-French multipurpose drain was reconstituted in the gallbladder fossa.  35 ml of purulent yellow fluid was aspirated. Catheter sutured to the skin and attached to a suction bulb.  Findings:Air-fluid collection in the gallbladder fossa is consistent with an abscess.  10-French drain was directed into the abscess from a transhepatic approach.  35 ml purulent fluid was removed.  Complications: None  Impression:CT guided placement of a drain into the gallbladder fossa abscess.  Fluid was sent for Gram stain and culture.   Original Report Authenticated By: Richarda Overlie, M.D.  [2 weeks]   Assessment: 67 year old male admitted with acute onset nausea, vomiting, hematemesis. Recent past medical history significant for postop biliary leak requiring ERCP with sphincterotomy and stent placement on 05/20/2013. On EGD he had significant secondary reflux esophagitis involving the distal one half of the esophagus, 2200 cc of fluid suctioned from the stomach due to gastric outlet obstruction, 2 cm segment of markedly inflamed swollen duodenal mucosa at the junction of the bulb in the second portion. CT showed rather large gallbladder fossa abscess with secondary inflammation and pink in on the duodenum producing a gastric outlet  obstruction. Percutaneous drainage, JP in place. Culture grew Klebsiella Pneumoniae. Unasyn has intermediate coverage based on Abx susceptibility profile.  Plan: 1. Discussed with nursing staff. Clamp NGT. No suction. 2. Continue clear liquids and continues to tolerate, then consider D/C NGT. 3. To discuss antibiotic coverage change with Dr. Darrick Penna.  4. Would recommend Dr. Lovell Sheehan to see patient to make further recommendations on continued  antibiotics and timing of JP removal.  5. Plan on removal of biliary stent in about one month. Our office will make arrangements.    LOS: 4 days   Tana Coast  06/11/2013, 12:12 PM

## 2013-06-11 NOTE — Progress Notes (Signed)
Readmission noted. CT guided drainage of subhepatic abscess revealed 35 cc of fluid. Drainage currently is serous and clear in nature. Final cultures revealed Klebsiella. Will switch to Rocephin. Will need to be discharged on ciprofloxacin for 2 weeks. If he is discharged over the weekend, he should call our office for an appointment for followup on July 1. He has had a drain placed at the time of the original surgery previously, so he is aware of how to manage the bulb drainage.

## 2013-06-11 NOTE — Progress Notes (Signed)
REVIEWED. TOLERATING CLD. NO NAUSEA/VOMITING. ABD PAIN IMPROVED. ADVANCE DIET. SPOKE WITH DR. Lovell Sheehan. HE WILL SEE PT TODAY. WILL LIKELY D/C NG TUBE IN AM JUN 21.

## 2013-06-11 NOTE — Progress Notes (Unsigned)
Mailed letter to pt

## 2013-06-11 NOTE — Progress Notes (Unsigned)
Letter from: Corbin Ade  Reason for Letter: Results Review Send letter to patient.  Send copy of letter with path to referring provider and PCP. Patient needs an office followup in July with extender to set up ERCP with biliary stent removal.

## 2013-06-12 DIAGNOSIS — K810 Acute cholecystitis: Secondary | ICD-10-CM

## 2013-06-12 LAB — CBC
HCT: 26.1 % — ABNORMAL LOW (ref 39.0–52.0)
MCV: 93.2 fL (ref 78.0–100.0)
Platelets: 413 10*3/uL — ABNORMAL HIGH (ref 150–400)
RBC: 2.8 MIL/uL — ABNORMAL LOW (ref 4.22–5.81)
WBC: 9.5 10*3/uL (ref 4.0–10.5)

## 2013-06-12 LAB — IRON AND TIBC
Iron: 64 ug/dL (ref 42–135)
TIBC: 249 ug/dL (ref 215–435)

## 2013-06-12 LAB — FERRITIN: Ferritin: 683 ng/mL — ABNORMAL HIGH (ref 22–322)

## 2013-06-12 MED ORDER — PANTOPRAZOLE SODIUM 40 MG PO TBEC
40.0000 mg | DELAYED_RELEASE_TABLET | Freq: Two times a day (BID) | ORAL | Status: DC
  Administered 2013-06-12 – 2013-06-13 (×2): 40 mg via ORAL
  Filled 2013-06-12 (×2): qty 1

## 2013-06-12 MED ORDER — PANTOPRAZOLE SODIUM 40 MG PO TBEC: 40.0000 mg | DELAYED_RELEASE_TABLET | Freq: Every day | ORAL | Status: DC

## 2013-06-12 NOTE — Progress Notes (Addendum)
Subjective: Since I last evaluated the patient HE HAS MILD ABDOMINAL PAIN. NO NAUSEA/VOMITING. FORMED SOFT STOOL TODAY. NO BRBPR OR MELENA. EGD:  SEVERE REFLUX ESOPHAGITIS/FRIABLE DUODENAL MUCOSA.   Objective: Vital signs in last 24 hours: Temp:  [97.5 F (36.4 C)-98.2 F (36.8 C)] 97.5 F (36.4 C) (06/21 0649) Pulse Rate:  [69-76] 69 (06/21 0649) Resp:  [18-20] 20 (06/21 0649) BP: (133-154)/(74-89) 133/74 mmHg (06/21 0649) SpO2:  [95 %-100 %] 100 % (06/21 0649) Last BM Date: 06/12/13  Intake/Output from previous day: 06/20 0701 - 06/21 0700 In: 745 [P.O.:720; I.V.:10] Out: 1235 [Urine:1200; Drains:35] Intake/Output this shift: Total I/O In: 5 [Other:5] Out: 15 [Drains:15]  General appearance: alert and cooperative GI: soft, non-tender; bowel sounds normal; DRAINS IN PLACE, OBESE  Lab Results:  Recent Labs  06/10/13 1943 06/11/13 0116 06/12/13 0546  WBC 10.6* 9.5 9.5  HGB 9.4* 9.5* 8.8*  HCT 28.1* 27.6* 26.1*  PLT 447* 386 413*   Studies/Results: Dg Chest Port 1 View  06/11/2013   *RADIOLOGY REPORT*  Clinical Data: PICC line placement.  PORTABLE CHEST - 1 VIEW  Comparison: 06/07/2013.  Findings: Right PICC line is in place with the tip at the level of the distal superior vena cava.  Cardiomegaly.  Central pulmonary vascular prominence without pulmonary edema.  Elevated right hemidiaphragm limits evaluation of the right lung base otherwise no evidence of segmental infiltrate.  Slightly tortuous aorta.  Nasogastric tube is in place and can be seen to the level of the gastroesophageal junction and not beyond this region secondary to technique and patient's habitus.  IMPRESSION: Right PICC line is in place with the tip at the level of the distal superior vena cava.  Please see above   Original Report Authenticated By: Lacy Duverney, M.D.    Medications: I have reviewed the patient's current medications.  Assessment/Plan: ADMITTED WITH NAUSEA/VOMITING/GOO-GB FOSSA ABSCESS  AFTER CHOLECYSTECTOMY/BILE LEAK/BILE DUCT STENT PLACEMENT. CLINICALLY IMPROVED. HB SLOWLY TRENDING DOWN IN HOSPITALIZED PT WITH MULTIPLE SOURCES FOR GI BLOOD LOSS. NO EVIDENCE FOR ACTIVE GI BLEED.  PLAN: 1. D/C HOME ON BID PPI 2. ADVANCE DIET. TRANSFUSE AS NEEDED. 3. D/C NG TUBE. 4. WILL SIGN OFF. CALL WITH QUESTIONS. WILL ARRANGE OPV W/ DR. Jena Gauss TO REMOVE BILIARY STENT.   LOS: 5 days   Sergio Schroeder 06/12/2013, 11:49 AM

## 2013-06-12 NOTE — Progress Notes (Signed)
Subjective: The patient had a comfortable night. His NG tube is draining as well as the drain in the right upper quadrant. He remains on IV fluids and IV antibiotics. Cultures revealed Klebsiella. His hemoglobin now is 8.8. Objective: Vital signs in last 24 hours: Temp:  [97.5 F (36.4 C)-98.2 F (36.8 C)] 97.5 F (36.4 C) (06/21 0649) Pulse Rate:  [69-76] 69 (06/21 0649) Resp:  [18-20] 20 (06/21 0649) BP: (133-154)/(74-89) 133/74 mmHg (06/21 0649) SpO2:  [95 %-100 %] 100 % (06/21 0649) Weight change:  Last BM Date: 06/11/13  Intake/Output from previous day: 06/20 0701 - 06/21 0700 In: 745 [P.O.:720; I.V.:10] Out: 1235 [Urine:1200; Drains:35] Intake/Output this shift:    Physical Exam: The patient is alert and oriented  Vital signs blood pressure 133/74 respiration 20 pulse 69 temp 97.5  HEENT negative eyes PERRLA patient has NG tube in place  Lungs clear to P&A  Heart regular rhythm no murmurs  Abdomen patient has percutaneous drain in right upper quadrant which is draining purulent fluid   Recent Labs  06/11/13 0116 06/12/13 0546  WBC 9.5 9.5  HGB 9.5* 8.8*  HCT 27.6* 26.1*  PLT 386 413*   BMET No results found for this basename: NA, K, CL, CO2, GLUCOSE, BUN, CREATININE, CALCIUM,  in the last 72 hours  Studies/Results: Dg Chest Port 1 View  06/11/2013   *RADIOLOGY REPORT*  Clinical Data: PICC line placement.  PORTABLE CHEST - 1 VIEW  Comparison: 06/07/2013.  Findings: Right PICC line is in place with the tip at the level of the distal superior vena cava.  Cardiomegaly.  Central pulmonary vascular prominence without pulmonary edema.  Elevated right hemidiaphragm limits evaluation of the right lung base otherwise no evidence of segmental infiltrate.  Slightly tortuous aorta.  Nasogastric tube is in place and can be seen to the level of the gastroesophageal junction and not beyond this region secondary to technique and patient's habitus.  IMPRESSION: Right PICC line  is in place with the tip at the level of the distal superior vena cava.  Please see above   Original Report Authenticated By: Lacy Duverney, M.D.    Medications:  . cefTRIAXone (ROCEPHIN)  IV  1 g Intravenous Q24H  . nicotine  14 mg Transdermal Daily  . pantoprazole  40 mg Oral BID AC  . sodium chloride  10-40 mL Intracatheter Q12H    . 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

## 2013-06-13 LAB — CBC WITH DIFFERENTIAL/PLATELET
Basophils Absolute: 0 10*3/uL (ref 0.0–0.1)
Basophils Relative: 0 % (ref 0–1)
Eosinophils Relative: 1 % (ref 0–5)
HCT: 26.3 % — ABNORMAL LOW (ref 39.0–52.0)
Lymphs Abs: 1.9 10*3/uL (ref 0.7–4.0)
MCV: 92.6 fL (ref 78.0–100.0)
Monocytes Relative: 10 % (ref 3–12)
Neutro Abs: 8.7 10*3/uL — ABNORMAL HIGH (ref 1.7–7.7)
RDW: 19.5 % — ABNORMAL HIGH (ref 11.5–15.5)
WBC: 11.9 10*3/uL — ABNORMAL HIGH (ref 4.0–10.5)

## 2013-06-13 MED ORDER — CIPROFLOXACIN HCL 500 MG PO TABS: 500.0000 mg | ORAL_TABLET | Freq: Two times a day (BID) | ORAL | Status: DC

## 2013-06-13 MED ORDER — PANTOPRAZOLE SODIUM 40 MG PO TBEC: 40.0000 mg | DELAYED_RELEASE_TABLET | Freq: Every day | ORAL | Status: DC

## 2013-06-13 NOTE — Discharge Summary (Signed)
Sergio Schroeder, Sergio Schroeder               ACCOUNT NO.:  [id number]  MEDICAL RECORD NO.:  [id number]  LOCATION:  A316                          FACILITY:  [facility]  PHYSICIAN:  Syanne Looney G. Renard Matter, MD   DATE OF BIRTH:  Dec 16, 1946  DATE OF ADMISSION:  06/07/2013 DATE OF DISCHARGE:  LH                              DISCHARGE SUMMARY   ADDENDUM:  This patient will be discharged on the following medications. Pantoprazole 40 mg daily, Hyzaar 100/25 daily.  He will also be sent home on Cipro 500 mg b.i.d.     Janeice Stegall G. Renard Matter, MD     AGM/MEDQ  D:  06/13/2013  T:  06/13/2013  Job:  [id number]

## 2013-06-13 NOTE — Discharge Summary (Signed)
Sergio Schroeder, Sergio Schroeder               ACCOUNT NO.:  [id number]  MEDICAL RECORD NO.:  [id number]  LOCATION:  A316                          FACILITY:  [facility]  PHYSICIAN:  Konstantina Nachreiner G. Renard Matter, MD   DATE OF BIRTH:  [birth year]  DATE OF ADMISSION:  06/07/2013 DATE OF DISCHARGE:  06/22/2014LH                              DISCHARGE SUMMARY   ADDENDUM:  Patient was stable and improved at the time of his discharge.     [name] G. Renard Matter, MD     AGM/MEDQ  D:  06/13/2013  T:  06/13/2013  Job:  [id number]

## 2013-06-13 NOTE — Discharge Summary (Signed)
Sergio Schroeder, Sergio Schroeder               ACCOUNT NO.:  [id number]  MEDICAL RECORD NO.:  [id number]  LOCATION:  A316                          FACILITY:  [facility]  PHYSICIAN:  Fiora Weill G. Renard Matter, MD   DATE OF BIRTH:  10/14/1946  DATE OF ADMISSION:  06/07/2013 DATE OF DISCHARGE:  LH                              DISCHARGE SUMMARY   This 67 year old male patient presented to the emergency room initially with a history of vomiting of blood.  He was hemodynamically stable at the time of his admission and started on Protonix infusion and referred for admission.  The patient had undergone a laparoscopic cholecystectomy for acute cholecystitis on 05/11/2013.  He was discharged.  HIDA scan confirmed bile leak and the patient was seen by Dr. Jena Gauss and underwent ERCP on May 29.  He had been doing relatively well since that time, but developed nausea and intolerance to foods.  He first vomited dark material and then blood.  He had numerous episodes of vomiting overnight and presented to the emergency room in this fashion.  Vital signs were stable.  His hemoglobin on admission was 11.2 and hematocrit 33.1.  PHYSICAL EXAMINATION:  VITAL SIGNS:  Blood pressure 112/74, pulse 96, temp 98.3, respirations 22. HEENT:  Eyes, PERRLA.  TM negative.  Oropharynx benign. NECK:  Supple.  No JVD or thyroid abnormalities. HEART:  Regular rhythm.  No murmurs. LUNGS:  Clear to P and A. ABDOMEN:  No palpable organs or masses.  Mild upper abdominal tenderness. SKIN:  Warm and dry. EXTREMITIES:  Free of edema.  LABORATORY DATA:  On admission CBC; WBC 11, hemoglobin 11.2, hematocrit 33.1.  Chemistries; sodium 142, potassium 3.5, chloride 95, CO2 37, glucose 131, BUN 25, creatinine 1.32, calcium 10.2.  Liver enzymes AST 16, ALT is 18, alkaline phosphatase 87, bilirubin 0.5, protein 8.4, lipase 24.  Subsequent labs; hemoglobins remained low 9.5, 8.8.  Vitamin B12 was 683.  Folate 375, reticulocytes 14, serum iron 64,  iron-binding capacity 185.  X-rays, acute abdominal series on admission, gastric distention, stable appearance of common duct, biliary stent, status post ERCP stent placement.  CT of the abdomen; 1. 5.6 x 5.5 cm fluid and gas collection in the gallbladder fossa,     with adjacent inflammatory changes compatible with an abscess in     the gallbladder fossa. 2. Inflammatory changes involving the second portion of duodenum,     small fluid collections. 3. Mild bibasilar atelectasis and minimal right pleural fluid. 4. Colonic diverticulosis without evidence of diverticulitis. 5. Liver and bilateral renal cyst. 6. A 3 mm nonobstructing left renal calculus. 7. Satisfactory position of biliary stent.  Chest x-ray on June 20, right PICC line in place with tip at the level of the distal superior vena cava.  HOSPITAL COURSE:  The patient on admission was having abdominal pain and he was started on 0.9% of sodium chloride infusion at 50 mL/h.  He was also started on IV Rocephin 1 g every 24 hours, NicoDerm patch 14 mg daily, IV Protonix 40 mg b.i.d.  He was given Zofran 4 mg every 6 hours p.r.n. for nausea, Tylenol rectal suppository for pain.  He had no further bleeding following  admission, but his hemoglobins were monitored and ranged as follows 9.4, 9.5, 8.8.  The patient's CT did have evidence of abscess in gallbladder fossa, this was subhepatic abscess.  He did have a gastric outlet obstruction with reflux, NG tube was inserted. The patient did have a trip over to [facility] for drainage of the abscess.  He tolerated this in satisfactory manner.  He also has had bile duct stent placement.  Subhepatic abscess revealed 35 mL of fluid. Cultures revealed Klebsiella, he was placed on IV Rocephin.  He tolerated these procedures in satisfactory manner.  Yesterday his NG tube was removed and he was started on liquids and soft diet, which he tolerated well.  It was felt he should be discharged  on Ciprofloxacin for 2 weeks.     Sugey Trevathan G. Renard Matter, MD     AGM/MEDQ  D:  06/13/2013  T:  06/13/2013  Job:  [id number]

## 2013-06-13 NOTE — Progress Notes (Signed)
Patient discharged home.  PICC line removed - WNL.  Instructed to leave dressing on for 24 hours and not to get it wet.  Instructed on drain care and how to flush.  Educated on S/S of infection.  Instructed on new medications and how to take them.  Instructed to follow up with PCP and Dr. Lovell Sheehan.  Patient verbalizes understanding of DC instructions.  [place] given.  No questions at this time. Patient left floor in stable condition via WC aided by NT.

## 2013-06-14 ENCOUNTER — Encounter (HOSPITAL_COMMUNITY): Payer: Self-pay | Admitting: Internal Medicine

## 2013-06-14 NOTE — Progress Notes (Signed)
OV made °

## 2013-07-05 ENCOUNTER — Ambulatory Visit (INDEPENDENT_AMBULATORY_CARE_PROVIDER_SITE_OTHER): Payer: Medicare Other | Admitting: Gastroenterology

## 2013-07-05 ENCOUNTER — Encounter: Payer: Self-pay | Admitting: Gastroenterology

## 2013-07-05 ENCOUNTER — Other Ambulatory Visit: Payer: Self-pay | Admitting: Gastroenterology

## 2013-07-05 VITALS — BP 129/76 | HR 78 | Temp 97.6°F | Ht 69.0 in | Wt 249.8 lb

## 2013-07-05 DIAGNOSIS — Z9889 Other specified postprocedural states: Secondary | ICD-10-CM

## 2013-07-05 DIAGNOSIS — D649 Anemia, unspecified: Secondary | ICD-10-CM | POA: Insufficient documentation

## 2013-07-05 NOTE — Assessment & Plan Note (Addendum)
67 year old gentleman with history of cholecystectomy on 05/12/2013. He subsequently developed a cystic duct stump leak requiring ERCP with stent placement as well as sphincterotomy and stone extraction on 05/20/2013. He presented back to the hospital on 06/07/2013 with hematemesis. On 06/08/2013, EGD showed significant secondary reflux esophagitis involving the distal one half of the esophagus, 2200 cc of fluid suctioned from the stomach due to gastric outlet obstruction, 2 cm segment of markedly inflamed swollen duodenal mucosa at the junction of the bulb in the second portion (biopsy showed ulcer but otherwise no dysplasia or malignancy). CT showed gallbladder fossa abscess.   Clinically he is doing well at this time. JP drain has been removed. He is completed antibiotic therapy. He is due for ERCP with biliary stent removal.  I have discussed the risks, alternatives, benefits with regards to but not limited to the risk of reaction to medication, bleeding, infection, perforation and the patient is agreeable to proceed. Written consent to be obtained.   Unasyn 3g IV on call to OR.

## 2013-07-05 NOTE — Progress Notes (Signed)
Primary Care Physician:  MCINNIS,ANGUS G, MD  Primary Gastroenterologist:  Michael Rourk, MD   Chief Complaint  Patient presents with  . Follow-up    HPI:  Sergio Schroeder is a 66 y.o. male here for followup of recent hospitalization. He presented on 06/07/2013 with hematemesis.  Recent past medical history significant for postop biliary leak requiring ERCP with sphincterotomy and stent placement and stone extraction on 05/20/2013.  On 06/08/2013, EGD showed significant secondary reflux esophagitis involving the distal one half of the esophagus, 2200 cc of fluid suctioned from the stomach due to gastric outlet obstruction, 2 cm segment of markedly inflamed swollen duodenal mucosa at the junction of the bulb in the second portion. CT showed rather large gallbladder fossa abscess with secondary inflammation and impingement on the duodenum producing a gastric outlet obstruction. He had percutaneous drainage of the gallbladder fossa abscess. Culture grew Klebsiella Pneumoniae. Treated with Cipro. JP has been removed. He is no longer on antibiotic therapy  Energy level returning. No abdominal pain. No N/V. BM mostly normal. Some looser stools. No melena, brbpr. Appetite good. Denies heartburn on pantoprazole. Denies dysphagia. He is also due for colonoscopy but will address this over the next few months.  Current Outpatient Prescriptions  Medication Sig Dispense Refill  . ferrous sulfate 325 (65 FE) MG tablet Take 325 mg by mouth daily with breakfast.      . losartan-hydrochlorothiazide (HYZAAR) 100-25 MG per tablet Take 1 tablet by mouth daily.      . pantoprazole (PROTONIX) 40 MG tablet Take 1 tablet (40 mg total) by mouth daily.  30 tablet  1  . ciprofloxacin (CIPRO) 500 MG tablet Take 1 tablet (500 mg total) by mouth 2 (two) times daily.  28 tablet  0   No current facility-administered medications for this visit.    Allergies as of 07/05/2013  . (No Known Allergies)    Past Medical History   Diagnosis Date  . Hypertension   . H/O renal calculi   . GERD (gastroesophageal reflux disease)   . Melanoma     L lateral torso  . Skin cancer     Past Surgical History  Procedure Laterality Date  . Melanoma excision Left 08/13    [facility]  . Cystoscopy    . Extracorporeal shock wave lithotripsy    . Kidney stone surgery    . Cholecystectomy N/A 05/12/2013    complicated by cystic duct stump leak and gb fossa abscess. Procedure: LAPAROSCOPIC CHOLECYSTECTOMY;  Surgeon: Mark A Jenkins, MD;  Location: [facility];  Service: General;  Laterality: N/A;  . Ercp N/A 05/20/2013    RMR: cystic duct stump leak s/p cholecystectomy. +choledocholithiasis, s/p sphinterotomy, stent placement, stone extraction  . Biliary stent placement N/A 05/20/2013    Procedure: BILIARY STENT PLACEMENT;  Surgeon: Robert M Rourk, MD;  Location: [facility];  Service: Gastroenterology;  Laterality: N/A;  . Sphincterotomy N/A 05/20/2013    Procedure: SPHINCTEROTOMY;  Surgeon: Robert M Rourk, MD;  Location: [facility];  Service: Gastroenterology;  Laterality: N/A;  . Colonoscopy  2004    Dr. Rehman: few scattered diverticula at sigmoid and transverse colon, polyps. path unknown.   . Eye surgery    . Eye surgery Left 35 years ago  . Esophagogastroduodenoscopy N/A 06/08/2013    [place] esophageal erosions in the distal one half of the esophagus. 2 cm segment of markedly inflamed and swollen duodenal mucosa at the junction of the bulb and second portion. Significant encroachment on the lumen causing   GOO. Bx: benign  Surgeon: Robert M Rourk, MD;  Location: [facility];  Service: Endoscopy;  Laterality: N/A;  WITH PHENERGAN 12.5 mg IV on call     Family History  Problem Relation Age of Onset  . Cancer Father   . Cancer Brother   . Colon cancer Neg Hx     History   Social History  . Marital Status: Divorced    Spouse Name: N/A    Number of Children: N/A  . Years of Education: N/A   Occupational History  . Not on file.    Social History Main Topics  . Smoking status: Current Every Day Smoker -- 0.50 packs/day for 50 years  . Smokeless tobacco: Not on file  . Alcohol Use: 12.6 oz/week    21 Cans of beer per week     Comment: beers socially.   . Drug Use: Yes    Special: Marijuana     Comment: 5-6 times a year   . Sexually Active: Not on file   Other Topics Concern  . Not on file   Social History Narrative  . No narrative on file      ROS:  General: Negative for anorexia, weight loss, fever, chills, fatigue, weakness. Eyes: Negative for vision changes.  ENT: Negative for hoarseness, difficulty swallowing , nasal congestion. CV: Negative for chest pain, angina, palpitations, dyspnea on exertion, peripheral edema.  Respiratory: Negative for dyspnea at rest, dyspnea on exertion, cough, sputum, wheezing.  GI: See history of present illness. GU:  Negative for dysuria, hematuria, urinary incontinence, urinary frequency, nocturnal urination.  MS: Negative for joint pain, low back pain.  Derm: Negative for rash or itching.  Neuro: Negative for weakness, abnormal sensation, seizure, frequent headaches, memory loss, confusion.  Psych: Negative for anxiety, depression, suicidal ideation, hallucinations.  Endo: Negative for unusual weight change.  Heme: Negative for bruising or bleeding. Allergy: Negative for rash or hives.    Physical Examination:  BP 129/76  Pulse 78  Temp(Src) 97.6 F (36.4 C) (Oral)  Ht 5' 9" (1.753 m)  Wt 249 lb 12.8 oz (113.309 kg)  BMI 36.87 kg/m2   General: Well-nourished, well-developed in no acute distress.  Head: Normocephalic, atraumatic.   Eyes: Conjunctiva pink, no icterus. Mouth: Oropharyngeal mucosa moist and pink , no lesions erythema or exudate. Neck: Supple without thyromegaly, masses, or lymphadenopathy.  Lungs: Clear to auscultation bilaterally.  Heart: Regular rate and rhythm, no murmurs rubs or gallops.  Abdomen: Bowel sounds are normal, nontender,  nondistended, no hepatosplenomegaly or masses, no abdominal bruits or    hernia , no rebound or guarding.   Rectal: not performed Extremities: No lower extremity edema. No clubbing or deformities.  Neuro: Alert and oriented x 4 , grossly normal neurologically.  Skin: Warm and dry, no rash or jaundice.   Psych: Alert and cooperative, normal mood and affect.  Labs: Lab Results  Component Value Date   WBC 11.9* 06/13/2013   HGB 9.0* 06/13/2013   HCT 26.3* 06/13/2013   MCV 92.6 06/13/2013   PLT 424* 06/13/2013   Lab Results  Component Value Date   IRON 64 06/12/2013   TIBC 249 06/12/2013   FERRITIN 683* 06/12/2013   Lab Results  Component Value Date   VITAMINB12 375 06/12/2013   Lab Results  Component Value Date   CREATININE 1.28 06/08/2013   BUN 25* 06/08/2013   NA 141 06/08/2013   K 3.5 06/08/2013   CL 101 06/08/2013   CO2 32 06/08/2013     Lab Results  Component Value Date   ALT 18 06/07/2013   AST 16 06/07/2013   ALKPHOS 87 06/07/2013   BILITOT 0.5 06/07/2013   Lab Results  Component Value Date   LIPASE 24 06/07/2013     Imaging Studies: Ct Abdomen Pelvis W Contrast  06/08/2013   *RADIOLOGY REPORT*  Clinical Data: Gastric outlet obstruction with a large volume of fluid aspirated from the stomach during upper endoscopy today.  At endoscopy, there were inflammatory changes causing significant narrowing of the lumen of the proximal duodenum.  Status post cholecystectomy on 05/15/2013 with subsequent bile leak and a biliary stent placement.  CT ABDOMEN AND PELVIS WITH CONTRAST  Technique:  Multidetector CT imaging of the abdomen and pelvis was performed following the standard protocol during bolus administration of intravenous contrast.  Contrast: 100mL OMNIPAQUE IOHEXOL 300 MG/ML  SOLN  Comparison: Chest and abdomen radiographs obtained yesterday.  Findings: There is a rounded fluid and gas collection in the gallbladder fossa, measuring 5.6 x 5.5 cm in maximum dimensions on image number 33.   There is ill-defined low density in the adjacent liver.  There are also inflammatory changes with soft tissue stranding extending inferiorly from this region as well as anteriorly and medially, involving the second portion of the duodenum.  There is also an oval fluid collection along the medial aspect of the second portion of the duodenum, measuring 2.4 x 1.1 cm on axial image number 34 and 0.8 cm in cephalocaudal dimension on coronal image number 36.  There is a second, smaller fluid collection just inferior to this on coronal image number 36, measuring 1.2 x 0.9 cm in maximum dimensions.  This is only faintly visible on axial image number 34, measuring measuring 0.7 cm in transverse diameter.  Also noted are multiple simple appearing liver cysts.  A nasogastric tube is in place with its tip in the distal stomach. Mild atelectasis at both lung bases.  Minimal right pleural fluid medially.  Unremarkable spleen, pancreas, urinary bladder prostate gland. Biliary stent in satisfactory position.  Small bilateral renal cysts.  3 mm mid to lower left renal calculus.  No ureteral calculi or hydronephrosis.  Multiple colonic diverticula.  These include a small number of diverticula in the hepatic flexure.  These are somewhat remote from the inflammatory changes.  Normal appearing appendix extending superiorly adjacent to the inferior margin of the right lobe of the liver, just inferior to the inflammatory changes.  No enlarged lymph nodes.  Mildly lobulated contours of the adrenal glands with no definite discrete adrenal mass seen.  Lumbar and lower thoracic spine degenerative changes.  These include changes of DISH.  IMPRESSION:  1.  5.6 x 5.5 cm fluid and gas collection in the gallbladder fossa with adjacent inflammatory changes, compatible with an abscess in the gallbladder fossa.  This has been discussed with Dr. Rourk. 2.  The inflammatory changes involve the second portion of the duodenum with two small oval fluid  collections, as described above. These could represent diverticula and/or small abscesses. 3. Mild bibasilar atelectasis and minimal right pleural fluid. 4.  Colonic diverticulosis without evidence of diverticulitis. 5.  Liver and bilateral renal cysts. 6.  3 mm nonobstructing left renal calculus. 7.  Satisfactory position of the biliary stent.   Original Report Authenticated By: Steven Reid, M.D.     Dg Abd Acute W/chest  06/07/2013   *RADIOLOGY REPORT*  Clinical Data: Hematemesis after recent ERCP and sphincterotomy.  ACUTE ABDOMEN SERIES (ABDOMEN 2 VIEW &   CHEST 1 VIEW)  Comparison: Imaging from ERCP on 05/20/2013.  Findings: Chest demonstrates clear lungs.  The heart is mildly enlarged.  No pneumothorax or pleural effusions are identified.  Abdominal films show stable positioning of a biliary stent in the common bile duct.  There is gastric distention noted with air fluid levels.  No evidence of small bowel obstruction or significant ileus.  No free air is identified.  No abnormal calcifications are seen.  IMPRESSION: Gastric distention.  Stable appearance of common duct biliary stent status post ERCP with stent placement.   Original Report Authenticated By: Glenn Yamagata, M.D.   Ct Image Guided Drainage Percut Cath  Peritoneal Retroperit  06/09/2013   *RADIOLOGY REPORT*  Clinical history:Post cholecystectomy and gallbladder fossa abscess.  PROCEDURE(S): CT GUIDED PLACEMENT OF DRAIN WITHIN GALLBLADDER FOSSA ABSCESS  Physician: Adam R. Henn, MD  Medications:Versed 2 mg, Fentanyl 100 mcg. A radiology nurse monitored the patient for moderate sedation.  Moderate sedation time:20 minutes  Fluoroscopy time: 7 seconds CT fluoroscopy  Procedure:Informed consent was obtained for a CT guided drain placement.  The patient was placed supine on the CT scanner. Images of the upper abdomen were obtained.  Air-fluid collection in the gallbladder fossa was identified.  The right anterior abdomen was prepped and draped in  a sterile fashion.  Skin was anesthetized with lidocaine.  18 gauge needle was directed into the gallbladder fossa abscess from a transhepatic approach.  Needle placement confirmed within the fluid collection and yellow fluid was aspirated.  A stiff Amplatz wire was placed and the tract was dilated.   A 10-French multipurpose drain was reconstituted in the gallbladder fossa.  35 ml of purulent yellow fluid was aspirated. Catheter sutured to the skin and attached to a suction bulb.  Findings:Air-fluid collection in the gallbladder fossa is consistent with an abscess.  10-French drain was directed into the abscess from a transhepatic approach.  35 ml purulent fluid was removed.  Complications: None  Impression:CT guided placement of a drain into the gallbladder fossa abscess.  Fluid was sent for Gram stain and culture.   Original Report Authenticated By: Adam Henn, M.D.      

## 2013-07-05 NOTE — Progress Notes (Signed)
Cc PCP 

## 2013-07-05 NOTE — Assessment & Plan Note (Signed)
Normocytic anemia in the setting of multiple illnesses. We'll recheck labs today including CBC, CMET, lipase. Plan on office visit in 2 months at that time we'll schedule colonoscopy.

## 2013-07-05 NOTE — Patient Instructions (Addendum)
1. ERCP with stent removal as planned. Please see separate instructions. 2. Please have your blood work done within the next day or 2. 3. You'll need to have a colonoscopy later this year. 4. Office visit in 2 months.

## 2013-07-08 LAB — COMPREHENSIVE METABOLIC PANEL
Albumin: 4 g/dL (ref 3.5–5.2)
BUN: 14 mg/dL (ref 6–23)
Calcium: 9.5 mg/dL (ref 8.4–10.5)
Chloride: 102 mEq/L (ref 96–112)
Glucose, Bld: 108 mg/dL — ABNORMAL HIGH (ref 70–99)
Potassium: 3.6 mEq/L (ref 3.5–5.3)
Sodium: 141 mEq/L (ref 135–145)
Total Protein: 6.8 g/dL (ref 6.0–8.3)

## 2013-07-08 LAB — CBC WITH DIFFERENTIAL/PLATELET
Eosinophils Relative: 3 % (ref 0–5)
HCT: 32.6 % — ABNORMAL LOW (ref 39.0–52.0)
Hemoglobin: 11.3 g/dL — ABNORMAL LOW (ref 13.0–17.0)
Lymphocytes Relative: 22 % (ref 12–46)
Lymphs Abs: 2 10*3/uL (ref 0.7–4.0)
MCV: 91.6 fL (ref 78.0–100.0)
Monocytes Absolute: 1 10*3/uL (ref 0.1–1.0)
Monocytes Relative: 11 % (ref 3–12)
RBC: 3.56 MIL/uL — ABNORMAL LOW (ref 4.22–5.81)
WBC: 9.1 10*3/uL (ref 4.0–10.5)

## 2013-07-08 NOTE — Progress Notes (Signed)
Quick Note:  Please let patient know that his hemoglobin has improved but still slightly low. Other labs okay. ERCP with stent removal as planned. Office visit in 2 months to schedule colonoscopy at that time. ______

## 2013-07-14 NOTE — Progress Notes (Signed)
Quick Note:  Tried to call pt- NA ______ 

## 2013-07-19 ENCOUNTER — Encounter (HOSPITAL_COMMUNITY): Payer: Self-pay

## 2013-07-19 ENCOUNTER — Encounter (HOSPITAL_COMMUNITY)
Admission: RE | Admit: 2013-07-19 | Discharge: 2013-07-19 | Disposition: A | Discharge status: Home / Self Care | Payer: Medicare Other | Source: Ambulatory Visit | Attending: Internal Medicine | Admitting: Internal Medicine

## 2013-07-19 VITALS — BP 137/78 | HR 85 | Temp 98.2°F | Resp 20 | Ht 69.0 in | Wt 251.0 lb

## 2013-07-19 DIAGNOSIS — Z9889 Other specified postprocedural states: Secondary | ICD-10-CM

## 2013-07-19 MED ORDER — SODIUM CHLORIDE 0.9 % IV SOLN: INTRAVENOUS | Status: DC

## 2013-07-19 NOTE — Progress Notes (Signed)
07/19/13 1244  OBSTRUCTIVE SLEEP APNEA  Have you ever been diagnosed with sleep apnea through a sleep study? No  Do you snore loudly (loud enough to be heard through closed doors)?  1  Do you often feel tired, fatigued, or sleepy during the daytime? 0  Has anyone observed you stop breathing during your sleep? 0  Do you have, or are you being treated for high blood pressure? 1  BMI more than 35 kg/m2? 1  Age over 66 years old? 1  Neck circumference greater than 40 cm/18 inches? 1  Gender: 1  Obstructive Sleep Apnea Score 6

## 2013-07-19 NOTE — Patient Instructions (Addendum)
[name]  07/19/2013   Your procedure is scheduled on:  07/22/2013  Report to [facility] at  615  AM.  Call this number if you have problems the morning of surgery: [phone]   Remember:   Do not eat food or drink liquids after midnight.   Take these medicines the morning of surgery with A SIP OF WATER: protonix, hyzaar   Do not wear jewelry, make-up or nail polish.  Do not wear lotions, powders, or perfumes.   Do not shave 48 hours prior to surgery. Men may shave face and neck.  Do not bring valuables to the hospital.  [facility] is not responsible for any belongings or valuables.  Contacts, dentures or bridgework may not be worn into surgery.  Leave suitcase in the car. After surgery it may be brought to your room.  For patients admitted to the hospital, checkout time is 11:00 AM the day of discharge.   Patients discharged the day of surgery will not be allowed to drive home.  Name and phone number of your driver: family  Special Instructions: N/A   Please read over the following fact sheets that you were given: Pain Booklet, Coughing and Deep Breathing, Surgical Site Infection Prevention, Anesthesia Post-op Instructions and Care and Recovery After Surgery Endoscopic Retrograde Cholangiopancreatography This is a test used to evaluate people with jaundice (a condition in which the person's skin is turning yellow because of the high level of bilirubin in your blood). Bilirubin is a product of the blood which is elevated when an obstruction in the bile duct occurs. The bile ducts are the pipe-like system which carry the bile from the liver to the gallbladder and out into your small bowel. In this test, a fiber optic endoscope (a small pencil-sized telescope) is inserted and a catheter is put into ducts for examination. This test can reveal stones, strictures, cysts, tumors, and other irregularities within the pancreatic ducts and bile ducts. PREPARATION FOR TEST Do not eat or  drink after midnight the day before the test.  NORMAL FINDINGS Normal size of biliary and pancreatic ducts. No obstruction or filling defects within the biliary or pancreatic ducts. Ranges for normal findings may vary among different laboratories and hospitals. You should always check with your doctor after having lab work or other tests done to discuss the meaning of your test results and whether your values are considered within normal limits. MEANING OF TEST  Your caregiver will go over the test results with you and discuss the importance and meaning of your results, as well as treatment options and the need for additional tests if necessary. OBTAINING THE TEST RESULTS  It is your responsibility to obtain your test results. Ask the lab or department performing the test when and how you will get your results. Document Released: 04/04/2005 Document Revised: 08/21/2011 Document Reviewed: 11/18/2008 [facility] Patient Information 2012 [place], [place].PATIENT INSTRUCTIONS POST-ANESTHESIA  IMMEDIATELY FOLLOWING SURGERY:  Do not drive or operate machinery for the first twenty four hours after surgery.  Do not make any important decisions for twenty four hours after surgery or while taking narcotic pain medications or sedatives.  If you develop intractable nausea and vomiting or a severe headache please notify your doctor immediately.  FOLLOW-UP:  Please make an appointment with your surgeon as instructed. You do not need to follow up with anesthesia unless specifically instructed to do so.  WOUND CARE INSTRUCTIONS (if applicable):  Keep a dry clean dressing on  the anesthesia/puncture wound site if there is drainage.  Once the wound has quit draining you may leave it open to air.  Generally you should leave the bandage intact for twenty four hours unless there is drainage.  If the epidural site drains for more than 36-48 hours please call the anesthesia department.  QUESTIONS?:  Please feel free to call  your physician or the hospital operator if you have any questions, and they will be happy to assist you.

## 2013-07-21 ENCOUNTER — Encounter (HOSPITAL_COMMUNITY): Payer: Self-pay | Admitting: Pharmacy Technician

## 2013-07-22 ENCOUNTER — Ambulatory Visit (HOSPITAL_COMMUNITY)
Admission: RE | Admit: 2013-07-22 | Discharge: 2013-07-22 | Disposition: A | Discharge status: Home / Self Care | Payer: Medicare Other | Source: Ambulatory Visit | Attending: Internal Medicine | Admitting: Internal Medicine

## 2013-07-22 ENCOUNTER — Encounter (HOSPITAL_COMMUNITY)
Admission: RE | Disposition: A | Discharge status: Home / Self Care | Payer: Self-pay | Source: Ambulatory Visit | Attending: Internal Medicine

## 2013-07-22 ENCOUNTER — Encounter (HOSPITAL_COMMUNITY): Payer: Self-pay | Admitting: *Deleted

## 2013-07-22 ENCOUNTER — Ambulatory Visit (HOSPITAL_COMMUNITY): Payer: Medicare Other | Admitting: Anesthesiology

## 2013-07-22 ENCOUNTER — Ambulatory Visit (HOSPITAL_COMMUNITY): Payer: Medicare Other

## 2013-07-22 ENCOUNTER — Encounter (HOSPITAL_COMMUNITY): Payer: Self-pay | Admitting: Anesthesiology

## 2013-07-22 DIAGNOSIS — K5710 Diverticulosis of small intestine without perforation or abscess without bleeding: Secondary | ICD-10-CM | POA: Insufficient documentation

## 2013-07-22 DIAGNOSIS — Z9889 Other specified postprocedural states: Secondary | ICD-10-CM

## 2013-07-22 DIAGNOSIS — Z809 Family history of malignant neoplasm, unspecified: Secondary | ICD-10-CM | POA: Insufficient documentation

## 2013-07-22 DIAGNOSIS — Z85820 Personal history of malignant melanoma of skin: Secondary | ICD-10-CM | POA: Insufficient documentation

## 2013-07-22 DIAGNOSIS — K8050 Calculus of bile duct without cholangitis or cholecystitis without obstruction: Secondary | ICD-10-CM

## 2013-07-22 DIAGNOSIS — Z4689 Encounter for fitting and adjustment of other specified devices: Secondary | ICD-10-CM | POA: Insufficient documentation

## 2013-07-22 DIAGNOSIS — I10 Essential (primary) hypertension: Secondary | ICD-10-CM | POA: Insufficient documentation

## 2013-07-22 DIAGNOSIS — F17200 Nicotine dependence, unspecified, uncomplicated: Secondary | ICD-10-CM | POA: Insufficient documentation

## 2013-07-22 DIAGNOSIS — Z6836 Body mass index (BMI) 36.0-36.9, adult: Secondary | ICD-10-CM | POA: Insufficient documentation

## 2013-07-22 DIAGNOSIS — Z8719 Personal history of other diseases of the digestive system: Secondary | ICD-10-CM | POA: Insufficient documentation

## 2013-07-22 DIAGNOSIS — K219 Gastro-esophageal reflux disease without esophagitis: Secondary | ICD-10-CM | POA: Insufficient documentation

## 2013-07-22 DIAGNOSIS — Z79899 Other long term (current) drug therapy: Secondary | ICD-10-CM | POA: Insufficient documentation

## 2013-07-22 HISTORY — PX: REMOVAL OF STONES: SHX5545

## 2013-07-22 HISTORY — PX: ERCP: SHX5425

## 2013-07-22 HISTORY — PX: SPHINCTEROTOMY: SHX5544

## 2013-07-22 HISTORY — PX: STENT REMOVAL: SHX6421

## 2013-07-22 SURGERY — ERCP, WITH INTERVENTION IF INDICATED
Anesthesia: General | Site: Esophagus

## 2013-07-22 MED ORDER — NEOSTIGMINE METHYLSULFATE 1 MG/ML IJ SOLN
INTRAMUSCULAR | Status: DC | PRN
  Administered 2013-07-22: 1 mg via INTRAVENOUS

## 2013-07-22 MED ORDER — STERILE WATER FOR IRRIGATION IR SOLN
Status: DC | PRN
  Administered 2013-07-22: 08:00:00

## 2013-07-22 MED ORDER — ONDANSETRON HCL 4 MG/2ML IJ SOLN: 4.0000 mg | Freq: Once | INTRAMUSCULAR | Status: AC | PRN

## 2013-07-22 MED ORDER — ROCURONIUM BROMIDE 100 MG/10ML IV SOLN
INTRAVENOUS | Status: DC | PRN
  Administered 2013-07-22: 15 mg via INTRAVENOUS
  Administered 2013-07-22: 5 mg via INTRAVENOUS

## 2013-07-22 MED ORDER — PHENYLEPHRINE HCL 10 MG/ML IJ SOLN
INTRAMUSCULAR | Status: AC
  Filled 2013-07-22: qty 1

## 2013-07-22 MED ORDER — FENTANYL CITRATE 0.05 MG/ML IJ SOLN
INTRAMUSCULAR | Status: AC
  Filled 2013-07-22: qty 5

## 2013-07-22 MED ORDER — MIDAZOLAM HCL 2 MG/2ML IJ SOLN
INTRAMUSCULAR | Status: AC
  Filled 2013-07-22: qty 2

## 2013-07-22 MED ORDER — MIDAZOLAM HCL 2 MG/2ML IJ SOLN
1.0000 mg | INTRAMUSCULAR | Status: DC | PRN
  Administered 2013-07-22 (×2): 2 mg via INTRAVENOUS

## 2013-07-22 MED ORDER — SUCCINYLCHOLINE CHLORIDE 20 MG/ML IJ SOLN
INTRAMUSCULAR | Status: DC | PRN
  Administered 2013-07-22: 120 mg via INTRAVENOUS

## 2013-07-22 MED ORDER — SODIUM CHLORIDE 0.9 % IV SOLN
INTRAVENOUS | Status: AC
  Filled 2013-07-22: qty 3

## 2013-07-22 MED ORDER — FENTANYL CITRATE 0.05 MG/ML IJ SOLN
INTRAMUSCULAR | Status: DC | PRN
  Administered 2013-07-22 (×2): 50 ug via INTRAVENOUS

## 2013-07-22 MED ORDER — LACTATED RINGERS IV SOLN
INTRAVENOUS | Status: DC
  Administered 2013-07-22: 1000 mL via INTRAVENOUS

## 2013-07-22 MED ORDER — ONDANSETRON HCL 4 MG/2ML IJ SOLN
4.0000 mg | Freq: Once | INTRAMUSCULAR | Status: AC
  Administered 2013-07-22: 4 mg via INTRAVENOUS

## 2013-07-22 MED ORDER — EPHEDRINE SULFATE 50 MG/ML IJ SOLN
INTRAMUSCULAR | Status: DC | PRN
  Administered 2013-07-22: 10 mg via INTRAVENOUS

## 2013-07-22 MED ORDER — SODIUM CHLORIDE 0.9 % IV SOLN
INTRAVENOUS | Status: DC | PRN
  Administered 2013-07-22: 08:00:00

## 2013-07-22 MED ORDER — SODIUM CHLORIDE 0.9 % IV SOLN
3.0000 g | Freq: Once | INTRAVENOUS | Status: AC
  Administered 2013-07-22: 3 g via INTRAVENOUS
  Filled 2013-07-22: qty 3

## 2013-07-22 MED ORDER — LIDOCAINE HCL (CARDIAC) 20 MG/ML IV SOLN
INTRAVENOUS | Status: DC | PRN
  Administered 2013-07-22: 30 mg via INTRAVENOUS

## 2013-07-22 MED ORDER — GLYCOPYRROLATE 0.2 MG/ML IJ SOLN
INTRAMUSCULAR | Status: DC | PRN
  Administered 2013-07-22: .2 mg via INTRAVENOUS

## 2013-07-22 MED ORDER — PROPOFOL 10 MG/ML IV BOLUS
INTRAVENOUS | Status: DC | PRN
  Administered 2013-07-22: 160 mg via INTRAVENOUS

## 2013-07-22 MED ORDER — EPHEDRINE SULFATE 50 MG/ML IJ SOLN
INTRAMUSCULAR | Status: AC
  Filled 2013-07-22: qty 1

## 2013-07-22 MED ORDER — ONDANSETRON HCL 4 MG/2ML IJ SOLN
INTRAMUSCULAR | Status: AC
  Filled 2013-07-22: qty 2

## 2013-07-22 MED ORDER — PHENYLEPHRINE HCL 10 MG/ML IJ SOLN
INTRAMUSCULAR | Status: DC | PRN
  Administered 2013-07-22: 100 ug via INTRAVENOUS

## 2013-07-22 MED ORDER — FENTANYL CITRATE 0.05 MG/ML IJ SOLN: 25.0000 ug | INTRAMUSCULAR | Status: DC | PRN

## 2013-07-22 SURGICAL SUPPLY — 31 items
350MG/ML OMNIPAQUE ×30 IMPLANT
BALLN CRE LF 10-12 240X5.5 (BALLOONS) ×2
BALLN RETRIEVAL 12X15 (BALLOONS) ×1 IMPLANT
BALLOON CRE LF 10-12 240X5.5 (BALLOONS) IMPLANT
BALN RTRVL 200 6-7FR 12-15 (BALLOONS) ×1
BASKET TRAPEZOID 3X6 (MISCELLANEOUS) IMPLANT
BASKET TRAPEZOID LITHO 2.5X5 (MISCELLANEOUS) IMPLANT
BLOCK BITE 60FR ADLT L/F BLUE (MISCELLANEOUS) ×1 IMPLANT
BSKT STON RTRVL TRAPEZOID 3X6 (MISCELLANEOUS)
DEVICE INFLATION ENCORE 26 (MISCELLANEOUS) IMPLANT
DEVICE LOCKING W-BIOPSY CAP (MISCELLANEOUS) ×2 IMPLANT
FLOOR PAD 36X40 (MISCELLANEOUS) ×2
GUIDEWIRE HYDRA JAGWIRE .35 (WIRE) IMPLANT
GUIDEWIRE JAG HINI 025X260CM (WIRE) IMPLANT
NDL HYPO 18GX1.5 BLUNT FILL (NEEDLE) IMPLANT
NEEDLE HYPO 18GX1.5 BLUNT FILL (NEEDLE) IMPLANT
PAD FLOOR 36X40 (MISCELLANEOUS) IMPLANT
SNARE ROTATE MED OVAL 20MM (MISCELLANEOUS) ×1 IMPLANT
SNARE SHORT THROW 27M MED OVAL (MISCELLANEOUS) IMPLANT
SPHINCTEROTOME AUTOTOME .25 (MISCELLANEOUS) IMPLANT
SPHINCTEROTOME HYDRATOME 44 (MISCELLANEOUS) ×2 IMPLANT
SPONGE GAUZE 4X4 12PLY (GAUZE/BANDAGES/DRESSINGS) ×1 IMPLANT
SYR 20CC LL (SYRINGE) ×1 IMPLANT
SYR 3ML LL SCALE MARK (SYRINGE) IMPLANT
SYR 50ML LL SCALE MARK (SYRINGE) ×2 IMPLANT
SYR INFLATION 60ML (SYRINGE) ×1 IMPLANT
SYSTEM CONTINUOUS INJECTION (MISCELLANEOUS) ×2 IMPLANT
TUBING ENDO SMARTCAP PENTAX (MISCELLANEOUS) ×1 IMPLANT
WALLSTENT METAL COVERED 10X60 (STENTS) IMPLANT
WALLSTENT METAL COVERED 10X80 (STENTS) IMPLANT
WATER STERILE IRR 1000ML POUR (IV SOLUTION) ×1 IMPLANT

## 2013-07-22 NOTE — Op Note (Signed)
NAMEPASTOR, SGRO               ACCOUNT NO.:  [id number]  MEDICAL RECORD NO.:  [id number]  LOCATION:  [facility]                          FACILITY:  [facility]  PHYSICIAN:  R. Roetta Sessions, MD FACP FACGDATE OF BIRTH:  Jun 09, 1946  DATE OF PROCEDURE:  07/22/2013 DATE OF DISCHARGE:                              OPERATIVE REPORT   PROCEDURE:  Endoscopic retrograde cholangiopancreatography with stent removal, sphincterotomy, balloon dilation, stone extraction.  INDICATIONS FOR PROCEDURE:  67 year old gentleman underwent cholecystectomy 2 months ago for gangrenous cholecystitis.  Postop course was complicated by biliary leak necessitating sphincterotomy, common duct stone extraction, stent placement.  Course was further complicated by subhepatic abscess requiring percutaneous drainage.  He has done well.  His drains have been removed.  He completed a course of antibiotics.  His LFTs are normal.  He feels well.  He comes now for ERCP with stent removal.  Risks, benefits, limitations, alternatives, imponderables have been discussed, questions answered.  All parties agreeable. PROCEDURE NOTE:  General anesthesia induced by Dr. [facility].  The patient received Unasyn 3 g IV prior to the procedure.  INSTRUMENT:  Pentax video chip with a side-viewing scope.  FINDINGS:  Examination of the tubular esophagus and stomach revealed normal-appearing distal esophageal and gastric mucosa.  Pylorus Channel was widely patent and easily traversed with the scope.  The scope was pulled back to the short position, 55 cm from the incisors. Scout film was taken.  The biliary stent was nearly occluded protruding out of the ampullary orifice.  There was a distal duodenal diverticulum present.  Utilizing the snare through the scope, the biliary stent was grasped and removed.  When the stent was removed, there were 2 small cholesterol stones recovered through the ampullary orifice.  The stent was removed  intact.  The scope was reintroduced in the second portion of the duodenum.  Using the standard Microvasive sphincterotome and guidewire, deep cannulation was easily obtained.  I elected to go ahead and rail off the sphincterotomeand place a graduated CRE balloon across the ampullary orifice as the prior sphincterotomy and scarred down significantly.  I inflated through 10 mm to 11 mm, held it there for 1 minute and took it down.  This did open the sphincterotomy up.  There was minimal bleeding and no apparent complication related to this maneuver.  When this balloon was taken down, 2 larger, mushy cholesterol stones were delivered through the ampullary orifice.  I subsequently railed off the CRE balloon and railed on an extractor balloon to the confluence and I inflated it to accommodate the size of the bile duct, which was not dilated.  I performed a balloon occlusion cholangiogram x2 with recovery of no further stone debris.  The duct drained extremely well after this maneuver.  Please note, there was no extravasation of contrast seen on the cholangiogram today.  Pancreatic duct was not manipulated.  The patient tolerated the procedure well and was taken to PACU in stable condition.  IMPRESSION: 1. Status post removal of biliary stent along with removal of common     duct stones as described above, status post sphincterotomy balloon     dilation and balloon dredging of the  biliary tree. 2. Previously noted biliary leak sealed. 3. Duodenal diverticulum.  RECOMMENDATIONS: 1. Clear liquids remainder of the day. 2. Advance diet as tolerated tomorrow. 3. Call if any problems.  This should take care of the patient's recent biliary issues.     Jonathon Bellows, MD Caleen Essex     RMR/MEDQ  D:  07/22/2013  T:  07/22/2013  Job:  [id number]  cc:   Dalia Heading, M.D. Fax: [phone]

## 2013-07-22 NOTE — Transfer of Care (Signed)
Immediate Anesthesia Transfer of Care Note  Patient: Sergio Schroeder  Procedure(s) Performed: Procedure(s): ENDOSCOPIC RETROGRADE CHOLANGIOPANCREATOGRAPHY (ERCP) (N/A) Biliary STENT REMOVAL (N/A) SPHINCTEROTOMY with dilation (N/A) REMOVAL OF common bile duct STONES (N/A)  Patient Location: PACU  Anesthesia Type:General  Level of Consciousness: awake, alert  and oriented  Airway & Oxygen Therapy: Patient Spontanous Breathing and Patient connected to face mask oxygen  Post-op Assessment: Report given to PACU RN  Post vital signs: Reviewed and stable  Complications: No apparent anesthesia complications

## 2013-07-22 NOTE — Anesthesia Preprocedure Evaluation (Signed)
Anesthesia Evaluation  Patient identified by MRN, date of birth, ID band Patient awake    Reviewed: Allergy & Precautions, H&P , NPO status , Patient's Chart, lab work & pertinent test results, reviewed documented beta blocker date and time   Airway Mallampati: II TM Distance: >3 FB Neck ROM: Full    Dental  (+) Teeth Intact   Pulmonary Current Smoker,  breath sounds clear to auscultation        Cardiovascular hypertension, Pt. on medications Rhythm:Regular Rate:Normal     Neuro/Psych    GI/Hepatic GERD-  ,Last PO at 0700 today;   Endo/Other  Morbid obesity  Renal/GU      Musculoskeletal   Abdominal (+) + obese,   Peds  Hematology   Anesthesia Other Findings   Reproductive/Obstetrics                           Anesthesia Physical Anesthesia Plan  ASA: II and emergent  Anesthesia Plan: General   Post-op Pain Management:    Induction: Rapid sequence, Cricoid pressure planned and Intravenous  Airway Management Planned: Oral ETT  Additional Equipment:   Intra-op Plan:   Post-operative Plan:   Informed Consent: I have reviewed the patients History and Physical, chart, labs and discussed the procedure including the risks, benefits and alternatives for the proposed anesthesia with the patient or authorized representative who has indicated his/her understanding and acceptance.   Dental advisory given  Plan Discussed with: CRNA, Anesthesiologist and Surgeon  Anesthesia Plan Comments:         Anesthesia Quick Evaluation

## 2013-07-22 NOTE — H&P (View-Only) (Signed)
Primary Care Physician:  Alice Reichert, MD  Primary Gastroenterologist:  Roetta Sessions, MD   Chief Complaint  Patient presents with  . Follow-up    HPI:  Sergio Schroeder is a 67 y.o. male here for followup of recent hospitalization. He presented on 06/07/2013 with hematemesis.  Recent past medical history significant for postop biliary leak requiring ERCP with sphincterotomy and stent placement and stone extraction on 05/20/2013.  On 06/08/2013, EGD showed significant secondary reflux esophagitis involving the distal one half of the esophagus, 2200 cc of fluid suctioned from the stomach due to gastric outlet obstruction, 2 cm segment of markedly inflamed swollen duodenal mucosa at the junction of the bulb in the second portion. CT showed rather large gallbladder fossa abscess with secondary inflammation and impingement on the duodenum producing a gastric outlet obstruction. He had percutaneous drainage of the gallbladder fossa abscess. Culture grew Klebsiella Pneumoniae. Treated with Cipro. JP has been removed. He is no longer on antibiotic therapy  Energy level returning. No abdominal pain. No N/V. BM mostly normal. Some looser stools. No melena, brbpr. Appetite good. Denies heartburn on pantoprazole. Denies dysphagia. He is also due for colonoscopy but will address this over the next few months.  Current Outpatient Prescriptions  Medication Sig Dispense Refill  . ferrous sulfate 325 (65 FE) MG tablet Take 325 mg by mouth daily with breakfast.      . losartan-hydrochlorothiazide (HYZAAR) 100-25 MG per tablet Take 1 tablet by mouth daily.      . pantoprazole (PROTONIX) 40 MG tablet Take 1 tablet (40 mg total) by mouth daily.  30 tablet  1  . ciprofloxacin (CIPRO) 500 MG tablet Take 1 tablet (500 mg total) by mouth 2 (two) times daily.  28 tablet  0   No current facility-administered medications for this visit.    Allergies as of 07/05/2013  . (No Known Allergies)    Past Medical History   Diagnosis Date  . Hypertension   . H/O renal calculi   . GERD (gastroesophageal reflux disease)   . Melanoma     L lateral torso  . Skin cancer     Past Surgical History  Procedure Laterality Date  . Melanoma excision Left 08/13    [facility]  . Cystoscopy    . Extracorporeal shock wave lithotripsy    . Kidney stone surgery    . Cholecystectomy N/A 05/12/2013    complicated by cystic duct stump leak and gb fossa abscess. Procedure: LAPAROSCOPIC CHOLECYSTECTOMY;  Surgeon: Dalia Heading, MD;  Location: [facility];  Service: General;  Laterality: N/A;  . Ercp N/A 05/20/2013    RMR: cystic duct stump leak s/p cholecystectomy. +choledocholithiasis, s/p sphinterotomy, stent placement, stone extraction  . Biliary stent placement N/A 05/20/2013    Procedure: BILIARY STENT PLACEMENT;  Surgeon: Corbin Ade, MD;  Location: [facility];  Service: Gastroenterology;  Laterality: N/A;  . Sphincterotomy N/A 05/20/2013    Procedure: SPHINCTEROTOMY;  Surgeon: Corbin Ade, MD;  Location: [facility];  Service: Gastroenterology;  Laterality: N/A;  . Colonoscopy  2004    Dr. Karilyn Cota: few scattered diverticula at sigmoid and transverse colon, polyps. path unknown.   . Eye surgery    . Eye surgery Left 35 years ago  . Esophagogastroduodenoscopy N/A 06/08/2013    [place] esophageal erosions in the distal one half of the esophagus. 2 cm segment of markedly inflamed and swollen duodenal mucosa at the junction of the bulb and second portion. Significant encroachment on the lumen causing  GOO. Bx: benign  Surgeon: Corbin Ade, MD;  Location: [facility];  Service: Endoscopy;  Laterality: N/A;  WITH PHENERGAN 12.5 mg IV on call     Family History  Problem Relation Age of Onset  . Cancer Father   . Cancer Brother   . Colon cancer Neg Hx     History   Social History  . Marital Status: Divorced    Spouse Name: N/A    Number of Children: N/A  . Years of Education: N/A   Occupational History  . Not on file.    Social History Main Topics  . Smoking status: Current Every Day Smoker -- 0.50 packs/day for 50 years  . Smokeless tobacco: Not on file  . Alcohol Use: 12.6 oz/week    21 Cans of beer per week     Comment: beers socially.   . Drug Use: Yes    Special: Marijuana     Comment: 5-6 times a year   . Sexually Active: Not on file   Other Topics Concern  . Not on file   Social History Narrative  . No narrative on file      ROS:  General: Negative for anorexia, weight loss, fever, chills, fatigue, weakness. Eyes: Negative for vision changes.  ENT: Negative for hoarseness, difficulty swallowing , nasal congestion. CV: Negative for chest pain, angina, palpitations, dyspnea on exertion, peripheral edema.  Respiratory: Negative for dyspnea at rest, dyspnea on exertion, cough, sputum, wheezing.  GI: See history of present illness. GU:  Negative for dysuria, hematuria, urinary incontinence, urinary frequency, nocturnal urination.  MS: Negative for joint pain, low back pain.  Derm: Negative for rash or itching.  Neuro: Negative for weakness, abnormal sensation, seizure, frequent headaches, memory loss, confusion.  Psych: Negative for anxiety, depression, suicidal ideation, hallucinations.  Endo: Negative for unusual weight change.  Heme: Negative for bruising or bleeding. Allergy: Negative for rash or hives.    Physical Examination:  BP 129/76  Pulse 78  Temp(Src) 97.6 F (36.4 C) (Oral)  Ht 5\' 9"  (1.753 m)  Wt 249 lb 12.8 oz (113.309 kg)  BMI 36.87 kg/m2   General: Well-nourished, well-developed in no acute distress.  Head: Normocephalic, atraumatic.   Eyes: Conjunctiva pink, no icterus. Mouth: Oropharyngeal mucosa moist and pink , no lesions erythema or exudate. Neck: Supple without thyromegaly, masses, or lymphadenopathy.  Lungs: Clear to auscultation bilaterally.  Heart: Regular rate and rhythm, no murmurs rubs or gallops.  Abdomen: Bowel sounds are normal, nontender,  nondistended, no hepatosplenomegaly or masses, no abdominal bruits or    hernia , no rebound or guarding.   Rectal: not performed Extremities: No lower extremity edema. No clubbing or deformities.  Neuro: Alert and oriented x 4 , grossly normal neurologically.  Skin: Warm and dry, no rash or jaundice.   Psych: Alert and cooperative, normal mood and affect.  Labs: Lab Results  Component Value Date   WBC 11.9* 06/13/2013   HGB 9.0* 06/13/2013   HCT 26.3* 06/13/2013   MCV 92.6 06/13/2013   PLT 424* 06/13/2013   Lab Results  Component Value Date   IRON 64 06/12/2013   TIBC 249 06/12/2013   FERRITIN 683* 06/12/2013   Lab Results  Component Value Date   VITAMINB12 375 06/12/2013   Lab Results  Component Value Date   CREATININE 1.28 06/08/2013   BUN 25* 06/08/2013   NA 141 06/08/2013   K 3.5 06/08/2013   CL 101 06/08/2013   CO2 32 06/08/2013  Lab Results  Component Value Date   ALT 18 06/07/2013   AST 16 06/07/2013   ALKPHOS 87 06/07/2013   BILITOT 0.5 06/07/2013   Lab Results  Component Value Date   LIPASE 24 06/07/2013     Imaging Studies: Ct Abdomen Pelvis W Contrast  06/08/2013   *RADIOLOGY REPORT*  Clinical Data: Gastric outlet obstruction with a large volume of fluid aspirated from the stomach during upper endoscopy today.  At endoscopy, there were inflammatory changes causing significant narrowing of the lumen of the proximal duodenum.  Status post cholecystectomy on 05/15/2013 with subsequent bile leak and a biliary stent placement.  CT ABDOMEN AND PELVIS WITH CONTRAST  Technique:  Multidetector CT imaging of the abdomen and pelvis was performed following the standard protocol during bolus administration of intravenous contrast.  Contrast: OMNIPAQUE IOHEXOL 300 MG/ML  SOLN  Comparison: Chest and abdomen radiographs obtained yesterday.  Findings: There is a rounded fluid and gas collection in the gallbladder fossa, measuring 5.6 x 5.5 cm in maximum dimensions on image number 33.   There is ill-defined low density in the adjacent liver.  There are also inflammatory changes with soft tissue stranding extending inferiorly from this region as well as anteriorly and medially, involving the second portion of the duodenum.  There is also an oval fluid collection along the medial aspect of the second portion of the duodenum, measuring 2.4 x 1.1 cm on axial image number 34 and 0.8 cm in cephalocaudal dimension on coronal image number 36.  There is a second, smaller fluid collection just inferior to this on coronal image number 36, measuring 1.2 x 0.9 cm in maximum dimensions.  This is only faintly visible on axial image number 34, measuring measuring 0.7 cm in transverse diameter.  Also noted are multiple simple appearing liver cysts.  A nasogastric tube is in place with its tip in the distal stomach. Mild atelectasis at both lung bases.  Minimal right pleural fluid medially.  Unremarkable spleen, pancreas, urinary bladder prostate gland. Biliary stent in satisfactory position.  Small bilateral renal cysts.  3 mm mid to lower left renal calculus.  No ureteral calculi or hydronephrosis.  Multiple colonic diverticula.  These include a small number of diverticula in the hepatic flexure.  These are somewhat remote from the inflammatory changes.  Normal appearing appendix extending superiorly adjacent to the inferior margin of the right lobe of the liver, just inferior to the inflammatory changes.  No enlarged lymph nodes.  Mildly lobulated contours of the adrenal glands with no definite discrete adrenal mass seen.  Lumbar and lower thoracic spine degenerative changes.  These include changes of DISH.  IMPRESSION:  1.  5.6 x 5.5 cm fluid and gas collection in the gallbladder fossa with adjacent inflammatory changes, compatible with an abscess in the gallbladder fossa.  This has been discussed with Dr. Jena Gauss. 2.  The inflammatory changes involve the second portion of the duodenum with two small oval fluid  collections, as described above. These could represent diverticula and/or small abscesses. 3. Mild bibasilar atelectasis and minimal right pleural fluid. 4.  Colonic diverticulosis without evidence of diverticulitis. 5.  Liver and bilateral renal cysts. 6.  3 mm nonobstructing left renal calculus. 7.  Satisfactory position of the biliary stent.   Original Report Authenticated By: Beckie Salts, M.D.     Dg Abd Acute W/chest  06/07/2013   *RADIOLOGY REPORT*  Clinical Data: Hematemesis after recent ERCP and sphincterotomy.  ACUTE ABDOMEN SERIES (ABDOMEN 2 VIEW &  CHEST 1 VIEW)  Comparison: Imaging from ERCP on 05/20/2013.  Findings: Chest demonstrates clear lungs.  The heart is mildly enlarged.  No pneumothorax or pleural effusions are identified.  Abdominal films show stable positioning of a biliary stent in the common bile duct.  There is gastric distention noted with air fluid levels.  No evidence of small bowel obstruction or significant ileus.  No free air is identified.  No abnormal calcifications are seen.  IMPRESSION: Gastric distention.  Stable appearance of common duct biliary stent status post ERCP with stent placement.   Original Report Authenticated By: Irish Lack, M.D.   Ct Image Guided Drainage Percut Cath  Peritoneal Retroperit  06/09/2013   *RADIOLOGY REPORT*  Clinical history:Post cholecystectomy and gallbladder fossa abscess.  PROCEDURE(S): CT GUIDED PLACEMENT OF DRAIN WITHIN GALLBLADDER FOSSA ABSCESS  Physician: Rachelle Hora. Henn, MD  Medications:Versed 2 mg, Fentanyl 100 mcg. A radiology nurse monitored the patient for moderate sedation.  Moderate sedation time:20 minutes  Fluoroscopy time: 7 seconds CT fluoroscopy  Procedure:Informed consent was obtained for a CT guided drain placement.  The patient was placed supine on the CT scanner. Images of the upper abdomen were obtained.  Air-fluid collection in the gallbladder fossa was identified.  The right anterior abdomen was prepped and draped in  a sterile fashion.  Skin was anesthetized with lidocaine.  18 gauge needle was directed into the gallbladder fossa abscess from a transhepatic approach.  Needle placement confirmed within the fluid collection and yellow fluid was aspirated.  A stiff Amplatz wire was placed and the tract was dilated.   A 10-French multipurpose drain was reconstituted in the gallbladder fossa.  35 ml of purulent yellow fluid was aspirated. Catheter sutured to the skin and attached to a suction bulb.  Findings:Air-fluid collection in the gallbladder fossa is consistent with an abscess.  10-French drain was directed into the abscess from a transhepatic approach.  35 ml purulent fluid was removed.  Complications: None  Impression:CT guided placement of a drain into the gallbladder fossa abscess.  Fluid was sent for Gram stain and culture.   Original Report Authenticated By: Richarda Overlie, M.D.

## 2013-07-22 NOTE — Anesthesia Procedure Notes (Signed)
Procedure Name: Intubation Date/Time: 07/22/2013 7:50 AM Performed by: Glynn Octave E Pre-anesthesia Checklist: Patient identified, Patient being monitored, Timeout performed, Emergency Drugs available and Suction available Patient Re-evaluated:Patient Re-evaluated prior to inductionOxygen Delivery Method: Circle System Utilized Preoxygenation: Pre-oxygenation with 100% oxygen Intubation Type: IV induction, Cricoid Pressure applied and Rapid sequence Ventilation: Mask ventilation without difficulty Laryngoscope Size: Mac and 3 Grade View: Grade II Tube type: Oral Tube size: 7.0 mm Number of attempts: 1 Airway Equipment and Method: stylet Placement Confirmation: ETT inserted through vocal cords under direct vision,  positive ETCO2 and breath sounds checked- equal and bilateral Secured at: 21 cm Tube secured with: Tape Dental Injury: Teeth and Oropharynx as per pre-operative assessment

## 2013-07-22 NOTE — Interval H&P Note (Signed)
History and Physical Interval Note:  07/22/2013 7:29 AM  Burnett L Younker  has presented today for surgery, with the diagnosis of Biliary Stent h/o bile leake  The various methods of treatment have been discussed with the patient and family. After consideration of risks, benefits and other options for treatment, the patient has consented to  Procedure(s) with comments: ENDOSCOPIC RETROGRADE CHOLANGIOPANCREATOGRAPHY (ERCP) (N/A) - 7:30 STENT REMOVAL (N/A) as a surgical intervention .  The patient's history has been reviewed, patient examined, no change in status, stable for surgery.  I have reviewed the patient's chart and labs.  Questions were answered to the patient's satisfaction.      Change. Patient here for ERCP with stent removal.The risks, benefits, limitations, alternatives, and mponderable have been reviewed with the patient. I specifically discussed a1 in 10 chance of pancreatitis, reaction to medications, bleeding, perforation and the possibility of a failed ERCP. Potential for sphincterotomy and stent placement also reviewed. Questions have been answered. All parties agreeable.   Eula Listen

## 2013-07-22 NOTE — Anesthesia Postprocedure Evaluation (Signed)
  Anesthesia Post-op Note  Patient: Sergio Schroeder  Procedure(s) Performed: Procedure(s): ENDOSCOPIC RETROGRADE CHOLANGIOPANCREATOGRAPHY (ERCP) (N/A) Biliary STENT REMOVAL (N/A) SPHINCTEROTOMY with dilation (N/A) REMOVAL OF common bile duct STONES (N/A)  Patient Location: PACU  Anesthesia Type:General  Level of Consciousness: awake, alert  and oriented  Airway and Oxygen Therapy: Patient Spontanous Breathing and Patient connected to face mask oxygen  Post-op Pain: none  Post-op Assessment: Post-op Vital signs reviewed, Patient's Cardiovascular Status Stable, Respiratory Function Stable, Patent Airway and No signs of Nausea or vomiting  Post-op Vital Signs: Reviewed and stable  Complications: No apparent anesthesia complications

## 2013-07-23 ENCOUNTER — Encounter (HOSPITAL_COMMUNITY): Payer: Self-pay | Admitting: Internal Medicine

## 2013-08-24 ENCOUNTER — Encounter: Payer: Self-pay | Admitting: Internal Medicine

## 2013-08-25 ENCOUNTER — Ambulatory Visit (INDEPENDENT_AMBULATORY_CARE_PROVIDER_SITE_OTHER): Payer: Medicare Other | Admitting: Gastroenterology

## 2013-08-25 ENCOUNTER — Encounter: Payer: Self-pay | Admitting: Gastroenterology

## 2013-08-25 VITALS — BP 130/77 | HR 65 | Temp 98.0°F | Ht 69.0 in | Wt 243.8 lb

## 2013-08-25 DIAGNOSIS — Z860100 Personal history of colon polyps, unspecified: Secondary | ICD-10-CM | POA: Insufficient documentation

## 2013-08-25 DIAGNOSIS — D649 Anemia, unspecified: Secondary | ICD-10-CM

## 2013-08-25 DIAGNOSIS — K219 Gastro-esophageal reflux disease without esophagitis: Secondary | ICD-10-CM

## 2013-08-25 MED ORDER — PANTOPRAZOLE SODIUM 40 MG PO TBEC: 40.0000 mg | DELAYED_RELEASE_TABLET | Freq: Every day | ORAL | Status: DC

## 2013-08-25 NOTE — Patient Instructions (Addendum)
1. Please have your blood work done at [facility]. 2. We have scheduled you for a colonoscopy with Dr. Jena Gauss. Please see separate instructions.

## 2013-08-25 NOTE — Assessment & Plan Note (Signed)
Due for colonoscopy at this time.  I have discussed the risks, alternatives, benefits with regards to but not limited to the risk of reaction to medication, bleeding, infection, perforation and the patient is agreeable to proceed. Written consent to be obtained.

## 2013-08-25 NOTE — Progress Notes (Signed)
Primary Care Physician:  Alice Reichert, MD  Primary Gastroenterologist:  Roetta Sessions, MD   Chief Complaint  Patient presents with  . Colonoscopy  . Medication Refill    HPI:  Sergio Schroeder is a 67 y.o. male here for followup and to schedule colonoscopy. Significant past medical history this year includes postop biliary leak requiring ERCP with sphincterotomy and stent placement and stone extraction on 05/20/2013. Presented back a few weeks later with hematemesis and on 06/08/2013 EGD showed significant secondary reflux esophagitis involving the distal one half of the esophagus, 2200 cc of fluid suctioned from the stomach due to gastric outlet obstruction, 2 cm segment of markedly inflamed swollen duodenal mucosa at the junction of the bulb in the second portion. CT showed rather large gallbladder fossa abscess with secondary inflammation and impingement of the duodenum producing the gastric outlet obstruction. He required percutaneous drainage. Culture grew Klebsiella pneumoniae a treated with Cipro. ERCP with stent removal, sphincterotomy, stone extraction and balloon dilation on 07/22/2013.   20+ pounds down intentionally. Feels well. Recently ran out of pantoprazole and had recurrent heartburn. Denies constipation, diarrhea, melena, rectal bleeding, abdominal pain, vomiting.  Current Outpatient Prescriptions  Medication Sig Dispense Refill  . ferrous sulfate 325 (65 FE) MG tablet Take 325 mg by mouth daily with breakfast.      . losartan-hydrochlorothiazide (HYZAAR) 100-25 MG per tablet Take 1 tablet by mouth daily.      . pantoprazole (PROTONIX) 40 MG tablet Take 1 tablet (40 mg total) by mouth daily.  30 tablet  1   No current facility-administered medications for this visit.    Allergies as of 08/25/2013  . (No Known Allergies)    Past Medical History  Diagnosis Date  . Hypertension   . H/O renal calculi   . GERD (gastroesophageal reflux disease)   . Melanoma     L  lateral torso  . Skin cancer     Past Surgical History  Procedure Laterality Date  . Melanoma excision Left 08/13    [facility]  . Cystoscopy    . Extracorporeal shock wave lithotripsy    . Kidney stone surgery    . Cholecystectomy N/A 05/12/2013    complicated by cystic duct stump leak and gb fossa abscess. Procedure: LAPAROSCOPIC CHOLECYSTECTOMY;  Surgeon: Dalia Heading, MD;  Location: [facility];  Service: General;  Laterality: N/A;  . Ercp N/A 05/20/2013    RMR: cystic duct stump leak s/p cholecystectomy. +choledocholithiasis, s/p sphinterotomy, stent placement, stone extraction  . Biliary stent placement N/A 05/20/2013    Procedure: BILIARY STENT PLACEMENT;  Surgeon: Corbin Ade, MD;  Location: [facility];  Service: Gastroenterology;  Laterality: N/A;  . Sphincterotomy N/A 05/20/2013    Procedure: SPHINCTEROTOMY;  Surgeon: Corbin Ade, MD;  Location: [facility];  Service: Gastroenterology;  Laterality: N/A;  . Colonoscopy  2004    Dr. Karilyn Cota: few scattered diverticula at sigmoid and transverse colon, polyps. path unknown.   . Eye surgery    . Eye surgery Left 35 years ago  . Esophagogastroduodenoscopy N/A 06/08/2013    [place] esophageal erosions in the distal one half of the esophagus. 2 cm segment of markedly inflamed and swollen duodenal mucosa at the junction of the bulb and second portion. Significant encroachment on the lumen causing GOO. Bx: benign  Surgeon: Corbin Ade, MD;  [facility];  Service: Endoscopy;  Laterality: N/A;  WITH PHENERGAN 12.5 mg IV on call   . Ercp N/A 07/22/2013  RMR: S/P removal of biliary stent along with removal of common duct stones as described above, status post sphincterotomy balloon dilation and balloon dredging of the biliary tree Previously noted biliary leak sealed/Duodenal diverticulum  . Stent removal N/A 07/22/2013    Procedure: Biliary STENT REMOVAL;  Surgeon: Corbin Ade, MD;  Location: [facility];  Service: Endoscopy;  Laterality: N/A;   . Sphincterotomy N/A 07/22/2013    Procedure: SPHINCTEROTOMY with dilation;  Surgeon: Corbin Ade, MD;  Location: [facility];  Service: Endoscopy;  Laterality: N/A;  . Removal of stones N/A 07/22/2013    Procedure: REMOVAL OF common bile duct STONES;  Surgeon: Corbin Ade, MD;  Location: [facility];  Service: Endoscopy;  Laterality: N/A;  . Colonoscopy  July 2004    Dr. Jena Gauss. Few scattered diverticula sigmoid and transverse colon, 2 small rectal polyps (path unavailable)    Family History  Problem Relation Age of Onset  . Cancer Father   . Cancer Brother   . Colon cancer Neg Hx     History   Social History  . Marital Status: Divorced    Spouse Name: N/A    Number of Children: N/A  . Years of Education: N/A   Occupational History  . Not on file.   Social History Main Topics  . Smoking status: Current Every Day Smoker -- 0.50 packs/day for 50 years  . Smokeless tobacco: Not on file  . Alcohol Use: No  . Drug Use: No     Comment: none since April  . Sexual Activity: Yes    Birth Control/ Protection: None   Other Topics Concern  . Not on file   Social History Narrative  . No narrative on file      ROS:  General: Negative for anorexia, weight loss, fever, chills, fatigue, weakness. Eyes: Negative for vision changes.  ENT: Negative for hoarseness, difficulty swallowing , nasal congestion. CV: Negative for chest pain, angina, palpitations, dyspnea on exertion, peripheral edema.  Respiratory: Negative for dyspnea at rest, dyspnea on exertion, cough, sputum, wheezing.  GI: See history of present illness. GU:  Negative for dysuria, hematuria, urinary incontinence, urinary frequency, nocturnal urination.  MS: Negative for joint pain, low back pain.  Derm: Negative for rash or itching.  Neuro: Negative for weakness, abnormal sensation, seizure, frequent headaches, memory loss, confusion.  Psych: Negative for anxiety, depression, suicidal ideation, hallucinations.  Endo:  Negative for unusual weight change.  Heme: Negative for bruising or bleeding. Allergy: Negative for rash or hives.    Physical Examination:  BP 130/77  Pulse 65  Temp(Src) 98 F (36.7 C) (Oral)  Ht 5\' 9"  (1.753 m)  Wt 243 lb 12.8 oz (110.587 kg)  BMI 35.99 kg/m2   General: Well-nourished, well-developed in no acute distress.  Head: Normocephalic, atraumatic.   Eyes: Conjunctiva pink, no icterus. Mouth: Oropharyngeal mucosa moist and pink , no lesions erythema or exudate. Neck: Supple without thyromegaly, masses, or lymphadenopathy.  Lungs: Clear to auscultation bilaterally.  Heart: Regular rate and rhythm, no murmurs rubs or gallops.  Abdomen: Bowel sounds are normal, nontender, nondistended, no hepatosplenomegaly or masses, no abdominal bruits or    hernia , no rebound or guarding.   Rectal: Deferred Extremities: No lower extremity edema. No clubbing or deformities.  Neuro: Alert and oriented x 4 , grossly normal neurologically.  Skin: Warm and dry, no rash or jaundice.   Psych: Alert and cooperative, normal mood and affect.  Labs: Lab Results  Component Value Date  WBC 9.1 07/07/2013   HGB 11.3* 07/07/2013   HCT 32.6* 07/07/2013   MCV 91.6 07/07/2013   PLT 388 07/07/2013     Imaging Studies: No results found.

## 2013-08-25 NOTE — Progress Notes (Signed)
CC'd to PCP 

## 2013-08-25 NOTE — Assessment & Plan Note (Signed)
Resume pantoprazole. Previously well controlled until he ran medication. Continue weight loss efforts. Discussed antireflux measures.

## 2013-08-25 NOTE — Assessment & Plan Note (Signed)
Recheck CBC at this time.

## 2013-08-26 ENCOUNTER — Telehealth: Payer: Self-pay

## 2013-08-26 ENCOUNTER — Other Ambulatory Visit: Payer: Self-pay

## 2013-08-26 DIAGNOSIS — D649 Anemia, unspecified: Secondary | ICD-10-CM

## 2013-08-26 DIAGNOSIS — Z1211 Encounter for screening for malignant neoplasm of colon: Secondary | ICD-10-CM

## 2013-08-26 LAB — CBC WITH DIFFERENTIAL/PLATELET
Basophils Absolute: 0 10*3/uL (ref 0.0–0.1)
Basophils Relative: 1 % (ref 0–1)
HCT: 37.8 % — ABNORMAL LOW (ref 39.0–52.0)
Lymphocytes Relative: 22 % (ref 12–46)
MCHC: 35.2 g/dL (ref 30.0–36.0)
Monocytes Absolute: 0.9 10*3/uL (ref 0.1–1.0)
Neutro Abs: 5.9 10*3/uL (ref 1.7–7.7)
Platelets: 384 10*3/uL (ref 150–400)
RDW: 23.4 % — ABNORMAL HIGH (ref 11.5–15.5)
WBC: 8.8 10*3/uL (ref 4.0–10.5)

## 2013-08-26 MED ORDER — PEG-KCL-NACL-NASULF-NA ASC-C 100 G PO SOLR: 1.0000 | ORAL | Status: DC

## 2013-08-26 NOTE — Telephone Encounter (Signed)
Rx sent to the pharmacy for pt's prep and instructions mailed to pt for the TCS on 09/21/2013 with RMR.

## 2013-09-21 ENCOUNTER — Encounter (HOSPITAL_COMMUNITY)
Admission: RE | Disposition: A | Discharge status: Home / Self Care | Payer: Self-pay | Source: Ambulatory Visit | Attending: Internal Medicine

## 2013-09-21 ENCOUNTER — Ambulatory Visit (HOSPITAL_COMMUNITY)
Admission: RE | Admit: 2013-09-21 | Discharge: 2013-09-21 | Disposition: A | Discharge status: Home / Self Care | Payer: Medicare Other | Source: Ambulatory Visit | Attending: Internal Medicine | Admitting: Internal Medicine

## 2013-09-21 ENCOUNTER — Encounter (HOSPITAL_COMMUNITY): Payer: Self-pay | Admitting: *Deleted

## 2013-09-21 DIAGNOSIS — Z1211 Encounter for screening for malignant neoplasm of colon: Secondary | ICD-10-CM

## 2013-09-21 DIAGNOSIS — K5730 Diverticulosis of large intestine without perforation or abscess without bleeding: Secondary | ICD-10-CM

## 2013-09-21 DIAGNOSIS — D649 Anemia, unspecified: Secondary | ICD-10-CM

## 2013-09-21 DIAGNOSIS — Z860100 Personal history of colon polyps, unspecified: Secondary | ICD-10-CM | POA: Insufficient documentation

## 2013-09-21 DIAGNOSIS — I10 Essential (primary) hypertension: Secondary | ICD-10-CM | POA: Insufficient documentation

## 2013-09-21 DIAGNOSIS — D126 Benign neoplasm of colon, unspecified: Secondary | ICD-10-CM

## 2013-09-21 HISTORY — PX: COLONOSCOPY: SHX5424

## 2013-09-21 HISTORY — DX: Polyp of colon: K63.5

## 2013-09-21 SURGERY — COLONOSCOPY
Anesthesia: Moderate Sedation

## 2013-09-21 MED ORDER — MEPERIDINE HCL 100 MG/ML IJ SOLN
INTRAMUSCULAR | Status: DC | PRN
  Administered 2013-09-21 (×2): 50 mg via INTRAVENOUS

## 2013-09-21 MED ORDER — MIDAZOLAM HCL 5 MG/5ML IJ SOLN
INTRAMUSCULAR | Status: AC
  Filled 2013-09-21: qty 10

## 2013-09-21 MED ORDER — MIDAZOLAM HCL 5 MG/5ML IJ SOLN
INTRAMUSCULAR | Status: DC | PRN
  Administered 2013-09-21 (×2): 2 mg via INTRAVENOUS
  Administered 2013-09-21: 1 mg via INTRAVENOUS

## 2013-09-21 MED ORDER — STERILE WATER FOR IRRIGATION IR SOLN
Status: DC | PRN
  Administered 2013-09-21: 11:00:00

## 2013-09-21 MED ORDER — ONDANSETRON HCL 4 MG/2ML IJ SOLN
INTRAMUSCULAR | Status: AC
  Filled 2013-09-21: qty 2

## 2013-09-21 MED ORDER — MEPERIDINE HCL 100 MG/ML IJ SOLN
INTRAMUSCULAR | Status: AC
  Filled 2013-09-21: qty 2

## 2013-09-21 MED ORDER — SODIUM CHLORIDE 0.9 % IV SOLN
INTRAVENOUS | Status: DC
  Administered 2013-09-21: 10:00:00 via INTRAVENOUS

## 2013-09-21 MED ORDER — ONDANSETRON HCL 4 MG/2ML IJ SOLN
INTRAMUSCULAR | Status: DC | PRN
  Administered 2013-09-21: 4 mg via INTRAVENOUS

## 2013-09-21 NOTE — Interval H&P Note (Signed)
History and Physical Interval Note:  09/21/2013 10:50 AM  Sergio Schroeder  has presented today for surgery, with the diagnosis of Personal hx of colon polyps / anemia  The various methods of treatment have been discussed with the patient and family. After consideration of risks, benefits and other options for treatment, the patient has consented to  Procedure(s) with comments: COLONOSCOPY (N/A) - 10:30 AM as a surgical intervention .  The patient's history has been reviewed, patient examined, no change in status, stable for surgery.  I have reviewed the patient's chart and labs.  Questions were answered to the patient's satisfaction.     No change. Colonoscopy per plan.The risks, benefits, limitations, alternatives and imponderables have been reviewed with the patient. Questions have been answered. All parties are agreeable.   Eula Listen

## 2013-09-21 NOTE — H&P (View-Only) (Signed)
Primary Care Physician:  Alice Reichert, MD  Primary Gastroenterologist:  Roetta Sessions, MD   Chief Complaint  Patient presents with  . Colonoscopy  . Medication Refill    HPI:  Sergio Schroeder is a 67 y.o. male here for followup and to schedule colonoscopy. Significant past medical history this year includes postop biliary leak requiring ERCP with sphincterotomy and stent placement and stone extraction on 05/20/2013. Presented back a few weeks later with hematemesis and on 06/08/2013 EGD showed significant secondary reflux esophagitis involving the distal one half of the esophagus, 2200 cc of fluid suctioned from the stomach due to gastric outlet obstruction, 2 cm segment of markedly inflamed swollen duodenal mucosa at the junction of the bulb in the second portion. CT showed rather large gallbladder fossa abscess with secondary inflammation and impingement of the duodenum producing the gastric outlet obstruction. He required percutaneous drainage. Culture grew Klebsiella pneumoniae a treated with Cipro. ERCP with stent removal, sphincterotomy, stone extraction and balloon dilation on 07/22/2013.   20+ pounds down intentionally. Feels well. Recently ran out of pantoprazole and had recurrent heartburn. Denies constipation, diarrhea, melena, rectal bleeding, abdominal pain, vomiting.  Current Outpatient Prescriptions  Medication Sig Dispense Refill  . ferrous sulfate 325 (65 FE) MG tablet Take 325 mg by mouth daily with breakfast.      . losartan-hydrochlorothiazide (HYZAAR) 100-25 MG per tablet Take 1 tablet by mouth daily.      . pantoprazole (PROTONIX) 40 MG tablet Take 1 tablet (40 mg total) by mouth daily.  30 tablet  1   No current facility-administered medications for this visit.    Allergies as of 08/25/2013  . (No Known Allergies)    Past Medical History  Diagnosis Date  . Hypertension   . H/O renal calculi   . GERD (gastroesophageal reflux disease)   . Melanoma     L  lateral torso  . Skin cancer     Past Surgical History  Procedure Laterality Date  . Melanoma excision Left 08/13    [facility]  . Cystoscopy    . Extracorporeal shock wave lithotripsy    . Kidney stone surgery    . Cholecystectomy N/A 05/12/2013    complicated by cystic duct stump leak and gb fossa abscess. Procedure: LAPAROSCOPIC CHOLECYSTECTOMY;  Surgeon: Dalia Heading, MD;  Location: [facility];  Service: General;  Laterality: N/A;  . Ercp N/A 05/20/2013    RMR: cystic duct stump leak s/p cholecystectomy. +choledocholithiasis, s/p sphinterotomy, stent placement, stone extraction  . Biliary stent placement N/A 05/20/2013    Procedure: BILIARY STENT PLACEMENT;  Surgeon: Corbin Ade, MD;  Location: [facility];  Service: Gastroenterology;  Laterality: N/A;  . Sphincterotomy N/A 05/20/2013    Procedure: SPHINCTEROTOMY;  Surgeon: Corbin Ade, MD;  Location: [facility];  Service: Gastroenterology;  Laterality: N/A;  . Colonoscopy  2004    Dr. Karilyn Cota: few scattered diverticula at sigmoid and transverse colon, polyps. path unknown.   . Eye surgery    . Eye surgery Left 35 years ago  . Esophagogastroduodenoscopy N/A 06/08/2013    [place] esophageal erosions in the distal one half of the esophagus. 2 cm segment of markedly inflamed and swollen duodenal mucosa at the junction of the bulb and second portion. Significant encroachment on the lumen causing GOO. Bx: benign  Surgeon: Corbin Ade, MD;  Location: [facility];  Service: Endoscopy;  Laterality: N/A;  WITH PHENERGAN 12.5 mg IV on call   . Ercp N/A 07/22/2013  RMR: S/P removal of biliary stent along with removal of common duct stones as described above, status post sphincterotomy balloon dilation and balloon dredging of the biliary tree Previously noted biliary leak sealed/Duodenal diverticulum  . Stent removal N/A 07/22/2013    Procedure: Biliary STENT REMOVAL;  Surgeon: Corbin Ade, MD;  Location: [facility];  Service: Endoscopy;  Laterality: N/A;   . Sphincterotomy N/A 07/22/2013    Procedure: SPHINCTEROTOMY with dilation;  Surgeon: Corbin Ade, MD;  Location: [facility];  Service: Endoscopy;  Laterality: N/A;  . Removal of stones N/A 07/22/2013    Procedure: REMOVAL OF common bile duct STONES;  Surgeon: Corbin Ade, MD;  Location: [facility];  Service: Endoscopy;  Laterality: N/A;  . Colonoscopy  July 2004    Dr. Jena Gauss. Few scattered diverticula sigmoid and transverse colon, 2 small rectal polyps (path unavailable)    Family History  Problem Relation Age of Onset  . Cancer Father   . Cancer Brother   . Colon cancer Neg Hx     History   Social History  . Marital Status: Divorced    Spouse Name: N/A    Number of Children: N/A  . Years of Education: N/A   Occupational History  . Not on file.   Social History Main Topics  . Smoking status: Current Every Day Smoker -- 0.50 packs/day for 50 years  . Smokeless tobacco: Not on file  . Alcohol Use: No  . Drug Use: No     Comment: none since April  . Sexual Activity: Yes    Birth Control/ Protection: None   Other Topics Concern  . Not on file   Social History Narrative  . No narrative on file      ROS:  General: Negative for anorexia, weight loss, fever, chills, fatigue, weakness. Eyes: Negative for vision changes.  ENT: Negative for hoarseness, difficulty swallowing , nasal congestion. CV: Negative for chest pain, angina, palpitations, dyspnea on exertion, peripheral edema.  Respiratory: Negative for dyspnea at rest, dyspnea on exertion, cough, sputum, wheezing.  GI: See history of present illness. GU:  Negative for dysuria, hematuria, urinary incontinence, urinary frequency, nocturnal urination.  MS: Negative for joint pain, low back pain.  Derm: Negative for rash or itching.  Neuro: Negative for weakness, abnormal sensation, seizure, frequent headaches, memory loss, confusion.  Psych: Negative for anxiety, depression, suicidal ideation, hallucinations.  Endo:  Negative for unusual weight change.  Heme: Negative for bruising or bleeding. Allergy: Negative for rash or hives.    Physical Examination:  BP 130/77  Pulse 65  Temp(Src) 98 F (36.7 C) (Oral)  Ht 5\' 9"  (1.753 m)  Wt 243 lb 12.8 oz (110.587 kg)  BMI 35.99 kg/m2   General: Well-nourished, well-developed in no acute distress.  Head: Normocephalic, atraumatic.   Eyes: Conjunctiva pink, no icterus. Mouth: Oropharyngeal mucosa moist and pink , no lesions erythema or exudate. Neck: Supple without thyromegaly, masses, or lymphadenopathy.  Lungs: Clear to auscultation bilaterally.  Heart: Regular rate and rhythm, no murmurs rubs or gallops.  Abdomen: Bowel sounds are normal, nontender, nondistended, no hepatosplenomegaly or masses, no abdominal bruits or    hernia , no rebound or guarding.   Rectal: Deferred Extremities: No lower extremity edema. No clubbing or deformities.  Neuro: Alert and oriented x 4 , grossly normal neurologically.  Skin: Warm and dry, no rash or jaundice.   Psych: Alert and cooperative, normal mood and affect.  Labs: Lab Results  Component Value Date  WBC 9.1 07/07/2013   HGB 11.3* 07/07/2013   HCT 32.6* 07/07/2013   MCV 91.6 07/07/2013   PLT 388 07/07/2013     Imaging Studies: No results found.

## 2013-09-21 NOTE — Op Note (Signed)
[facility] [address] [place], [address]   COLONOSCOPY PROCEDURE REPORT  PATIENT: Sergio, Schroeder  MR#:         [id number] BIRTHDATE: 29-May-1946 , 66  yrs. old GENDER: Male ENDOSCOPIST: R.  Roetta Sessions, MD FACP FACG REFERRED BY:  Butch Penny, M.D. PROCEDURE DATE:  09/21/2013 PROCEDURE:     Ileocolonoscopy with biopsy and snare polypectomy  INDICATIONS: screening examination; distant history of colonic polyps  INFORMED CONSENT:  The risks, benefits, alternatives and imponderables including but not limited to bleeding, perforation as well as the possibility of a missed lesion have been reviewed.  The potential for biopsy, lesion removal, etc. have also been discussed.  Questions have been answered.  All parties agreeable. Please see the history and physical in the medical record for more information.  MEDICATIONS: Versed 5 mg IV and Demerol 100 mg IV in divided doses. Zofran 4 mg IV  DESCRIPTION OF PROCEDURE:  After a digital rectal exam was performed, the EC-3890Li ([id number])  colonoscope was advanced from the anus through the rectum and colon to the area of the cecum, ileocecal valve and appendiceal orifice.  The cecum was deeply intubated.  These structures were well-seen and photographed for the record.  From the level of the cecum and ileocecal valve, the scope was slowly and cautiously withdrawn.  The mucosal surfaces were carefully surveyed utilizing scope tip deflection to facilitate fold flattening as needed.  The scope was pulled down into the rectum where a thorough examination including retroflexion was performed.    FINDINGS:  Adequate preparation. Normal rectum. Pancolonic diverticulosis. (1) diminutive polyp at the ileocecal valve; (1) 6 mm polyp at the splenic flexure and (1) 5 mm polyp in the mid sigmoid segment; otherwise, the remainder of the colonic mucosa appeared normal. The distal 5 cm of terminal ileum mucosa also appeared  normal.  THERAPEUTIC / DIAGNOSTIC MANEUVERS PERFORMED:  The above-mentioned polyps were cold biopsied and hot snare- removed, respectively.  COMPLICATIONS: None  CECAL WITHDRAWAL TIME:  19 minutes  IMPRESSION:  Colonic polyps-removed as described above. Colonic diverticulosis.  RECOMMENDATIONS: Followup on pathology.   _______________________________ eSigned:  R. Roetta Sessions, MD FACP [facility] 09/21/2013 11:34 AM   CC:    PATIENT NAME:  Sergio Schroeder, Sergio Schroeder MR#: [id number]

## 2013-09-23 ENCOUNTER — Encounter: Payer: Self-pay | Admitting: Internal Medicine

## 2013-09-23 ENCOUNTER — Encounter (HOSPITAL_COMMUNITY): Payer: Self-pay | Admitting: Internal Medicine

## 2013-09-24 ENCOUNTER — Telehealth: Payer: Self-pay | Admitting: Internal Medicine

## 2013-09-24 NOTE — Telephone Encounter (Signed)
Pt came by office this morning to let us know he is going out of town until November and when his results from [facility] are back to call this number [phone]

## 2013-09-27 NOTE — Telephone Encounter (Signed)
Tried to call pt- LMOM 

## 2013-09-27 NOTE — Telephone Encounter (Signed)
Pt is aware of results. 

## 2013-11-23 ENCOUNTER — Other Ambulatory Visit: Payer: Self-pay | Admitting: Dermatology

## 2014-01-06 ENCOUNTER — Other Ambulatory Visit: Payer: Self-pay | Admitting: Dermatology

## 2014-06-07 IMAGING — CT CT ABD-PELV W/ CM
2 of 4 series · 15 of 46 positions shown, 17 images · IV contrast (Omnipaque 300)
Comparison: Chest and abdomen radiographs obtained yesterday.

CLINICAL DATA: Gastric outlet obstruction with a large volume of
fluid aspirated from the stomach during upper endoscopy today.  At
endoscopy, there were inflammatory changes causing significant
narrowing of the lumen of the proximal duodenum.  Status post
cholecystectomy on 05/15/2013 with subsequent bile leak and a
biliary stent placement.

CT ABDOMEN AND PELVIS WITH CONTRAST
TECHNIQUE: Multidetector CT imaging of the abdomen and pelvis was
performed following the standard protocol during bolus
administration of intravenous contrast.
Contrast: 100mL OMNIPAQUE IOHEXOL 300 MG/ML  SOLN

[Series 2: abd_pel_with 5.0 b40f · axial · 0.89mm/px · z∈[-536,-86]mm · 12 of 100 slices shown, 14 images]
[im 5/100  soft-tissue]
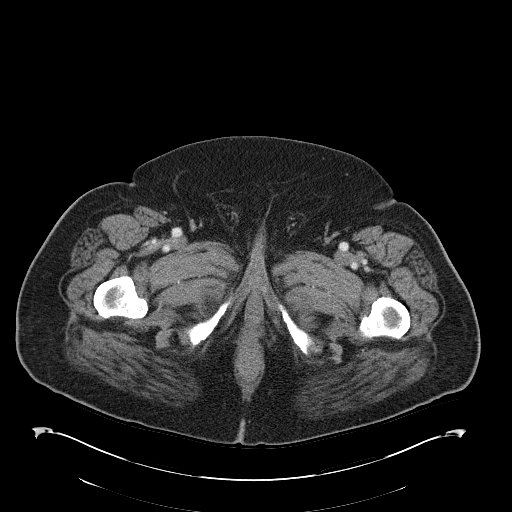
[im 5/100  bone]
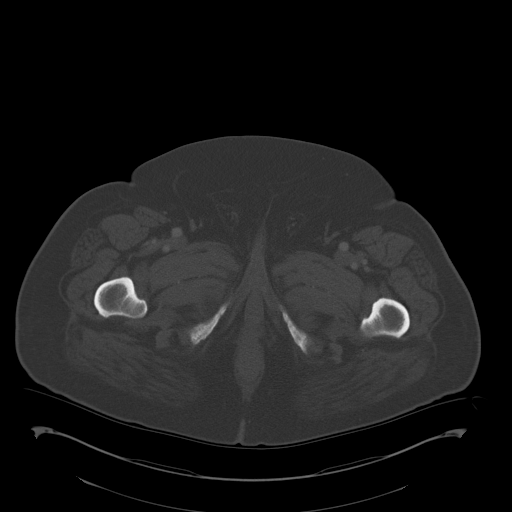
[im 15/100  soft-tissue]
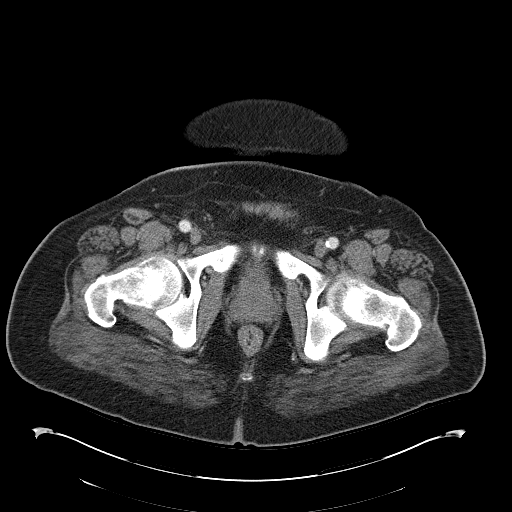
[im 24/100  soft-tissue]
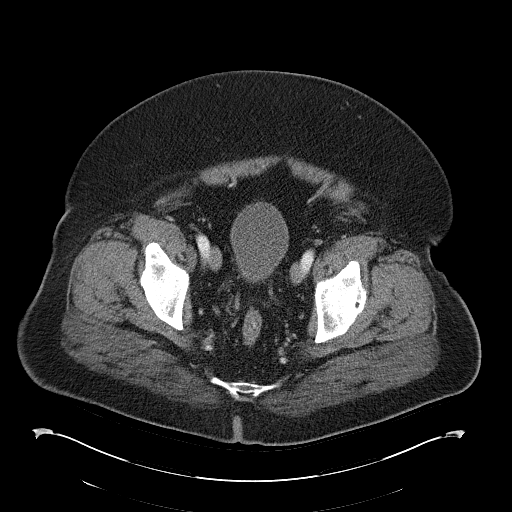
[im 29/100  soft-tissue]
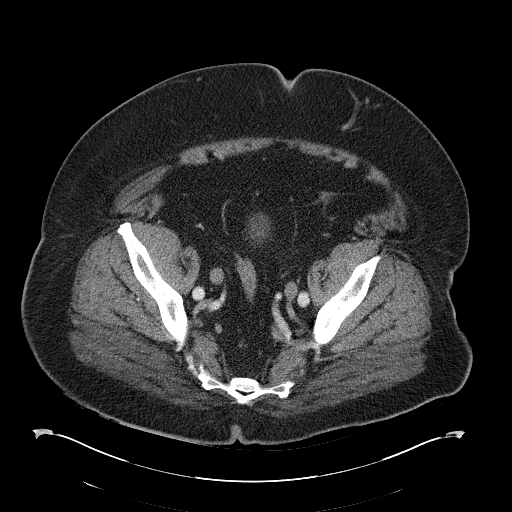
[im 38/100  soft-tissue]
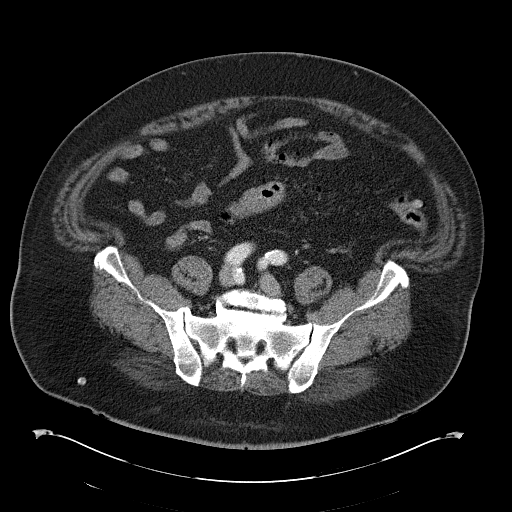
[im 48/100  soft-tissue]
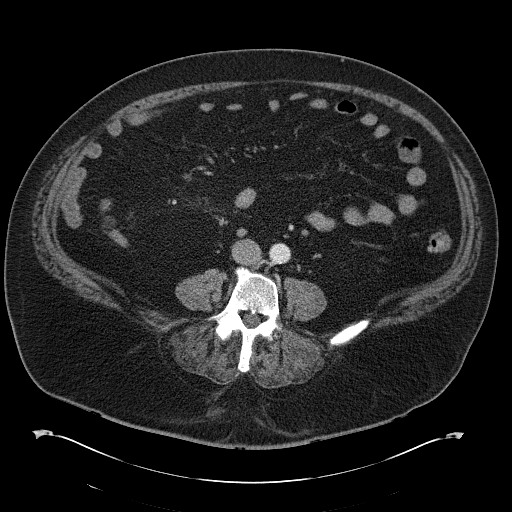
[im 52/100  soft-tissue]
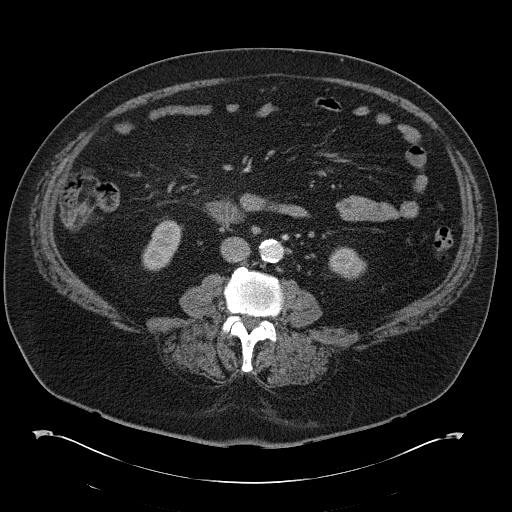
[im 62/100  soft-tissue]
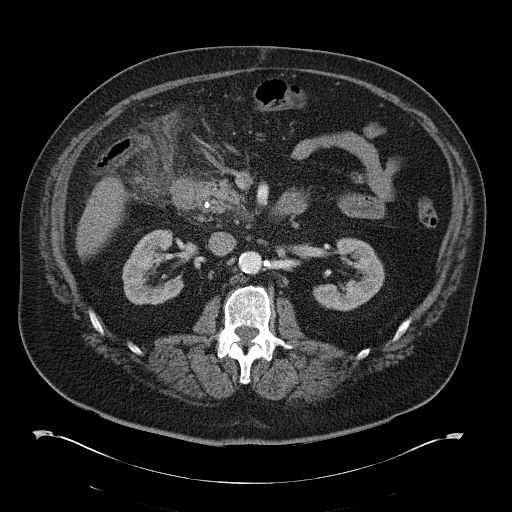
[im 71/100  soft-tissue]
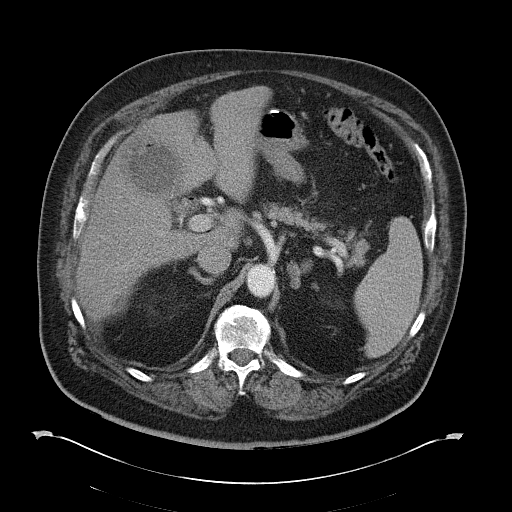
[im 71/100  bone]
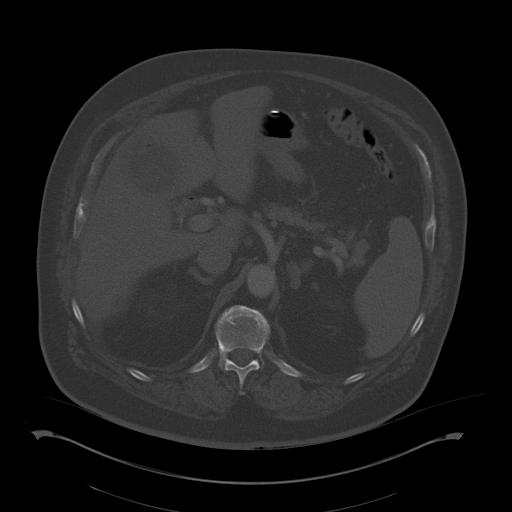
[im 76/100  soft-tissue]
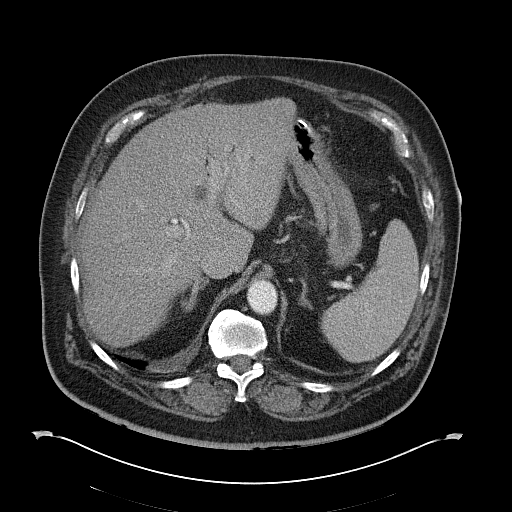
[im 85/100  soft-tissue]
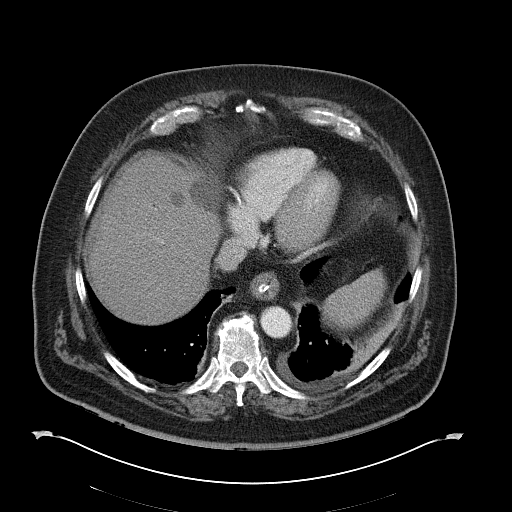
[im 95/100  soft-tissue]
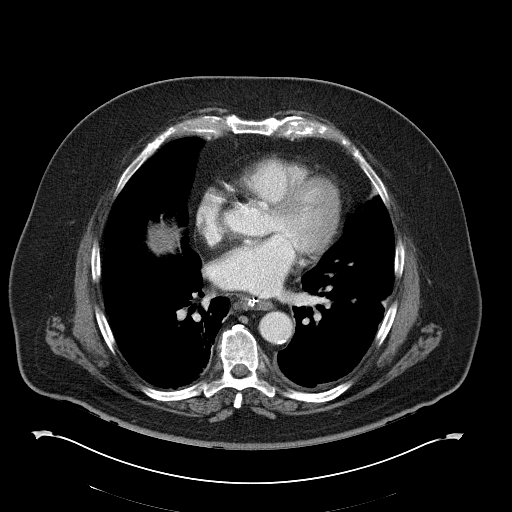

[Series 4: abd_pel_with 3.0 spo · coronal · 0.80mm/px · 3 of 114 slices shown]
[im 38/114  soft-tissue]
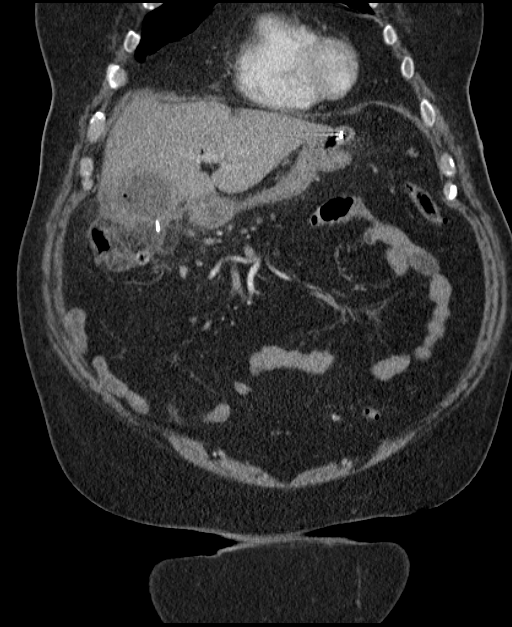
[im 51/114  soft-tissue]
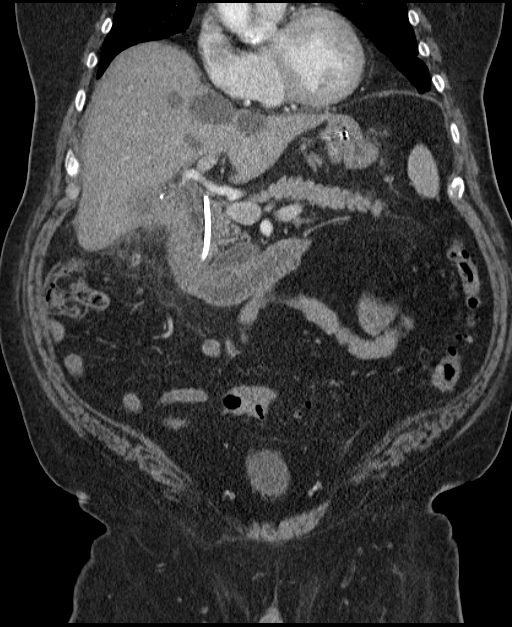
[im 63/114  soft-tissue]
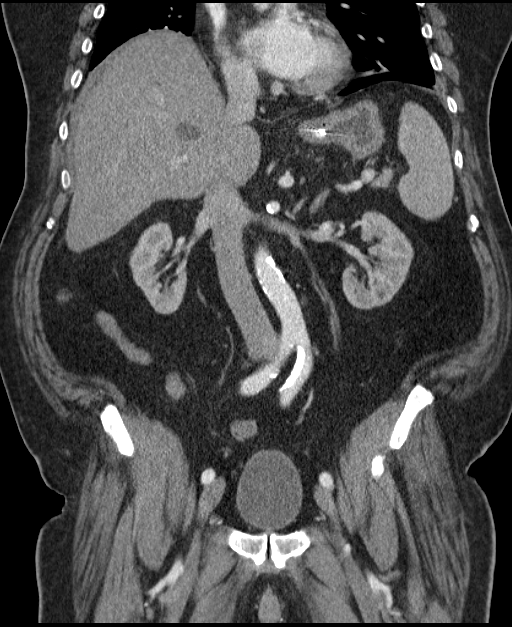

[15 of 46 positions shown; findings below may reference images not displayed]

FINDINGS: There is a rounded fluid and gas collection in the
gallbladder fossa, measuring 5.6 x 5.5 cm in maximum dimensions on
image number 33.  There is ill-defined low density in the adjacent
liver.  There are also inflammatory changes with soft tissue
stranding extending inferiorly from this region as well as
anteriorly and medially, involving the second portion of the
duodenum.

There is also an oval fluid collection along the medial aspect of
the second portion of the duodenum, measuring 2.4 x 1.1 cm on axial
image number 34 and 0.8 cm in cephalocaudal dimension on coronal
image number 36.  There is a second, smaller fluid collection just
inferior to this on coronal image number 36, measuring 1.2 x 0.9 cm
in maximum dimensions.  This is only faintly visible on axial image
number 34, measuring measuring 0.7 cm in transverse diameter.

Also noted are multiple simple appearing liver cysts.  A
nasogastric tube is in place with its tip in the distal stomach.
Mild atelectasis at both lung bases.  Minimal right pleural fluid
medially.

Unremarkable spleen, pancreas, urinary bladder prostate gland.
Biliary stent in satisfactory position.  Small bilateral renal
cysts.  3 mm mid to lower left renal calculus.  No ureteral calculi
or hydronephrosis.

Multiple colonic diverticula.  These include a small number of
diverticula in the hepatic flexure.  These are somewhat remote from
the inflammatory changes.  Normal appearing appendix extending
superiorly adjacent to the inferior margin of the right lobe of the
liver, just inferior to the inflammatory changes.  No enlarged
lymph nodes.  Mildly lobulated contours of the adrenal glands with
no definite discrete adrenal mass seen.  Lumbar and lower thoracic
spine degenerative changes.  These include changes of DISH.
IMPRESSION: 1.  5.6 x 5.5 cm fluid and gas collection in the gallbladder fossa
with adjacent inflammatory changes, compatible with an abscess in
the gallbladder fossa.  This has been discussed with Dr. Nazareth.
2.  The inflammatory changes involve the second portion of the
duodenum with two small oval fluid collections, as described above.
These could represent diverticula and/or small abscesses.
3. Mild bibasilar atelectasis and minimal right pleural fluid.
4.  Colonic diverticulosis without evidence of diverticulitis.
5.  Liver and bilateral renal cysts.
6.  3 mm nonobstructing left renal calculus.
7.  Satisfactory position of the biliary stent.

## 2014-06-10 IMAGING — DX DG CHEST 1V PORT
1 series · 1 of 1 positions shown · non-contrast
Comparison: 06/07/2013.

CLINICAL DATA: PICC line placement.

PORTABLE CHEST - 1 VIEW

[portable]
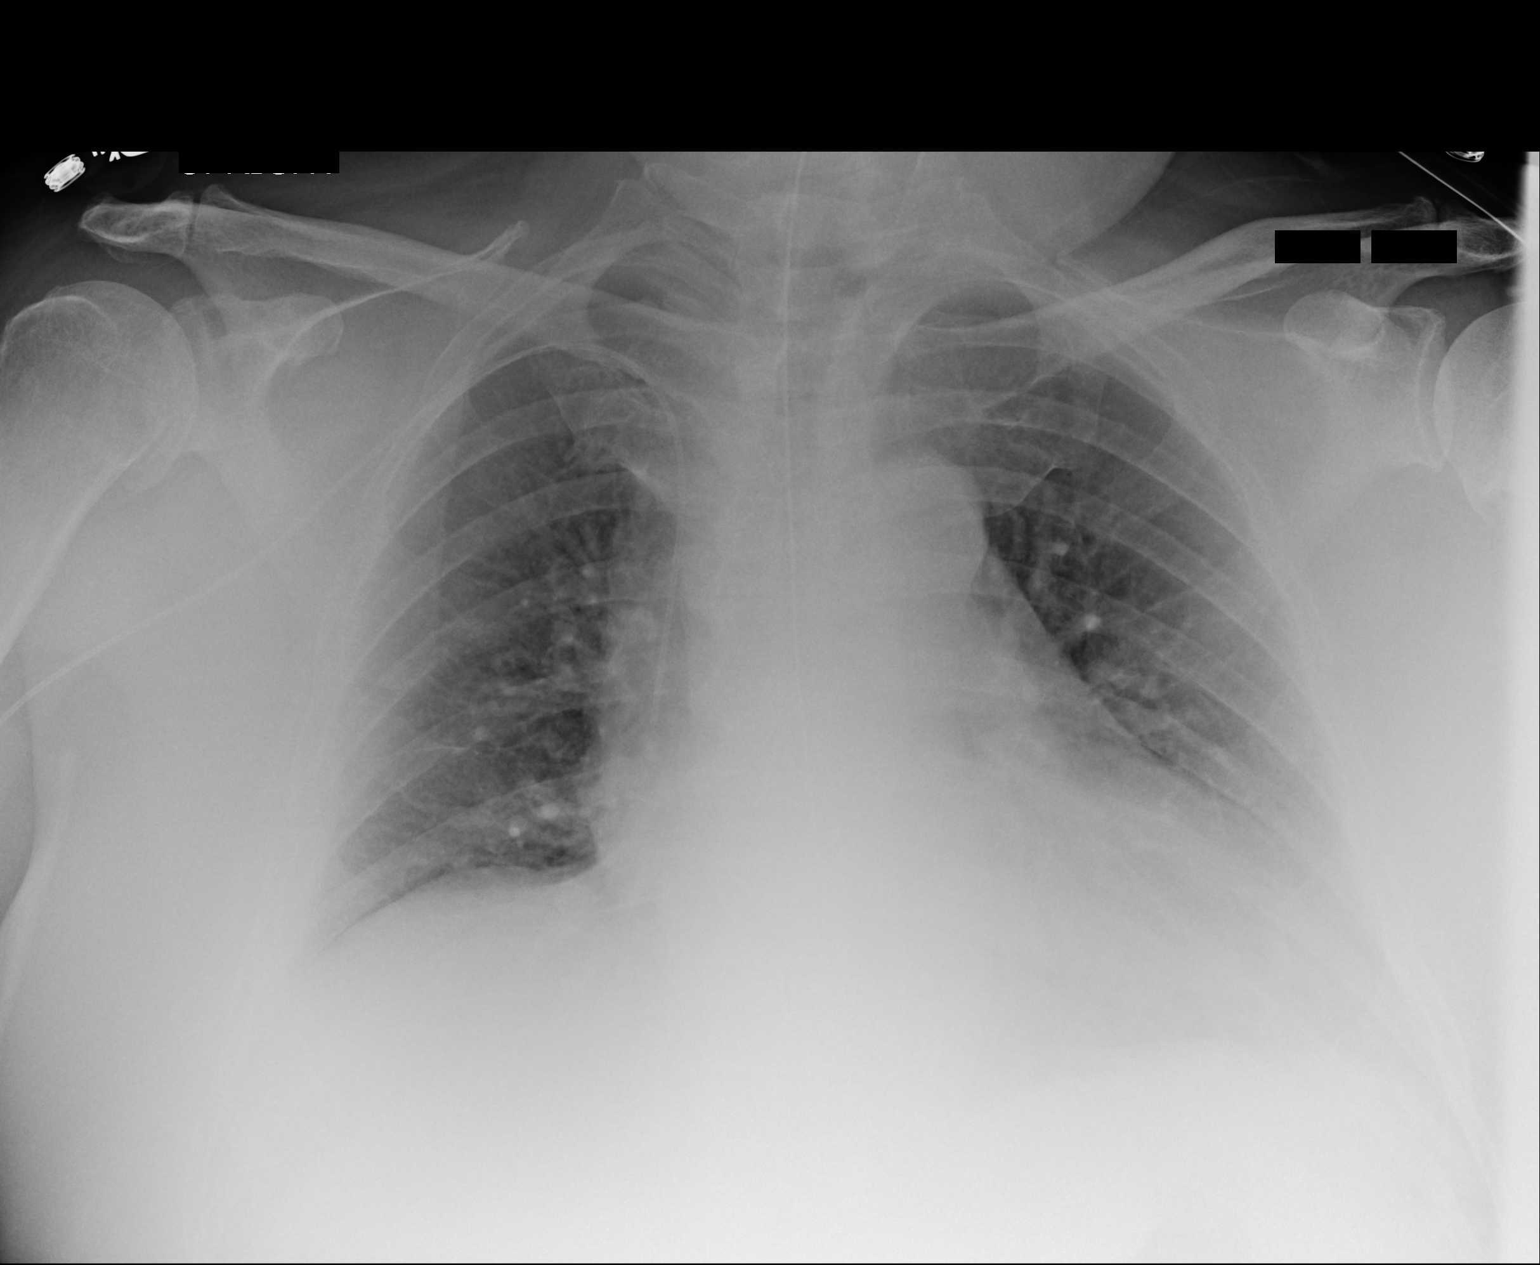

[1 of 1 positions shown; findings below may reference images not displayed]

FINDINGS: Right PICC line is in place with the tip at the level of
the distal superior vena cava.

Cardiomegaly.

Central pulmonary vascular prominence without pulmonary edema.

Elevated right hemidiaphragm limits evaluation of the right lung
base otherwise no evidence of segmental infiltrate.

Slightly tortuous aorta.

Nasogastric tube is in place and can be seen to the level of the
gastroesophageal junction and not beyond this region secondary to
technique and patient's habitus.
IMPRESSION: [Right PICC line is in place with the tip at the level of the
distal superior vena cava.

Please see above

## 2014-08-30 ENCOUNTER — Other Ambulatory Visit: Payer: Self-pay | Admitting: Dermatology

## 2014-09-01 ENCOUNTER — Other Ambulatory Visit: Payer: Self-pay

## 2014-09-01 MED ORDER — PANTOPRAZOLE SODIUM 40 MG PO TBEC: 40.0000 mg | DELAYED_RELEASE_TABLET | Freq: Two times a day (BID) | ORAL | Status: DC

## 2014-09-09 DIAGNOSIS — D229 Melanocytic nevi, unspecified: Secondary | ICD-10-CM

## 2014-09-09 HISTORY — DX: Melanocytic nevi, unspecified: D22.9

## 2014-10-27 ENCOUNTER — Other Ambulatory Visit: Payer: Self-pay | Admitting: Dermatology

## 2015-01-25 ENCOUNTER — Other Ambulatory Visit: Payer: Self-pay | Admitting: Dermatology

## 2015-08-30 ENCOUNTER — Other Ambulatory Visit: Payer: Self-pay | Admitting: Dermatology

## 2015-10-12 ENCOUNTER — Other Ambulatory Visit: Payer: Self-pay | Admitting: Dermatology

## 2016-01-10 ENCOUNTER — Other Ambulatory Visit: Payer: Self-pay | Admitting: Dermatology

## 2016-03-21 ENCOUNTER — Other Ambulatory Visit: Payer: Self-pay | Admitting: Dermatology

## 2016-09-24 ENCOUNTER — Other Ambulatory Visit: Payer: Self-pay | Admitting: Dermatology

## 2016-11-21 ENCOUNTER — Other Ambulatory Visit: Payer: Self-pay | Admitting: Dermatology

## 2016-12-12 ENCOUNTER — Other Ambulatory Visit: Payer: Self-pay | Admitting: Dermatology

## 2017-04-21 ENCOUNTER — Other Ambulatory Visit: Payer: Self-pay | Admitting: Dermatology

## 2017-10-16 ENCOUNTER — Other Ambulatory Visit: Payer: Self-pay | Admitting: Dermatology

## 2017-10-16 DIAGNOSIS — C4492 Squamous cell carcinoma of skin, unspecified: Secondary | ICD-10-CM

## 2017-10-16 DIAGNOSIS — C4491 Basal cell carcinoma of skin, unspecified: Secondary | ICD-10-CM

## 2017-10-16 HISTORY — DX: Squamous cell carcinoma of skin, unspecified: C44.92

## 2017-10-16 HISTORY — DX: Basal cell carcinoma of skin, unspecified: C44.91

## 2017-11-25 ENCOUNTER — Encounter: Payer: Self-pay | Admitting: Radiation Oncology

## 2017-11-25 ENCOUNTER — Ambulatory Visit
Admission: RE | Admit: 2017-11-25 | Discharge: 2017-11-25 | Disposition: A | Discharge status: Home / Self Care | Payer: Medicare Other | Source: Ambulatory Visit | Attending: Radiation Oncology | Admitting: Radiation Oncology

## 2017-11-25 VITALS — BP 165/92 | HR 89 | Temp 98.3°F | Resp 20 | Wt 249.6 lb

## 2017-11-25 DIAGNOSIS — Z510 Encounter for antineoplastic radiation therapy: Secondary | ICD-10-CM | POA: Insufficient documentation

## 2017-11-25 DIAGNOSIS — D044 Carcinoma in situ of skin of scalp and neck: Secondary | ICD-10-CM | POA: Insufficient documentation

## 2017-11-25 DIAGNOSIS — C4442 Squamous cell carcinoma of skin of scalp and neck: Secondary | ICD-10-CM | POA: Insufficient documentation

## 2017-11-25 DIAGNOSIS — K219 Gastro-esophageal reflux disease without esophagitis: Secondary | ICD-10-CM | POA: Insufficient documentation

## 2017-11-25 DIAGNOSIS — F17210 Nicotine dependence, cigarettes, uncomplicated: Secondary | ICD-10-CM | POA: Insufficient documentation

## 2017-11-25 DIAGNOSIS — K635 Polyp of colon: Secondary | ICD-10-CM | POA: Diagnosis not present

## 2017-11-25 DIAGNOSIS — I10 Essential (primary) hypertension: Secondary | ICD-10-CM | POA: Insufficient documentation

## 2017-11-25 NOTE — Progress Notes (Signed)
Please see the Nurse Progress Note in the MD Initial Consult Encounter for this patient. 

## 2017-11-25 NOTE — Progress Notes (Signed)
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   HISTORY OF PRESENT ILLNESS:Sergio Schroeder is a 71 y.o. male who is being seen today for an initial consultation visit by the request of Dr. Harvel Quale and a diagnosis of recurrent skin cancer. Sergio Schroeder has a history of melanoma which was excised from his left lateral torso many years ago and since Sergio Schroeder has been NED. Sergio Schroeder also has a history of squamous cell carcinoma, and has had multiple Mohs procedures for this including along the right temple and left lower eye in 06/09/17. 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 Of note, the site was measured preoperatively at 1.7 x 1.2 cm.  Given the finding, Sergio Schroeder comes today for further question of the possibility of using radiotherapy to treat him. Of note no additional Mohs procedures are scheduled, and his left shoulder lesion was completely excised.   PREVIOUS RADIATION THERAPY: No Past Medical History:  Past Medical History:  Diagnosis Date  . Colon polyps   . GERD (gastroesophageal reflux disease)   . H/O renal calculi   . Hypertension   . Melanoma ([place])    L lateral torso  . Skin cancer     Past Surgical History: Past Surgical History:  Procedure Laterality Date  . BILIARY STENT PLACEMENT N/A 05/20/2013   Procedure: BILIARY STENT PLACEMENT;  Surgeon: Daneil Dolin, MD;  Location: [facility];  Service: Gastroenterology;  Laterality: N/A;  . CHOLECYSTECTOMY N/A 7/82/4235   complicated by cystic duct stump leak and gb fossa abscess. Procedure: LAPAROSCOPIC CHOLECYSTECTOMY;  Surgeon: Jamesetta So, MD;  Location: [facility];  Service: General;  Laterality: N/A;  . COLONOSCOPY  2004   Dr. Laural Golden: few scattered diverticula at sigmoid and transverse colon, polyps. path unknown.   . COLONOSCOPY  July 2004   Dr. Gala Romney. Few scattered diverticula sigmoid and transverse colon, 2 small rectal polyps (path unavailable)  . COLONOSCOPY N/A 09/21/2013   Procedure: COLONOSCOPY;  Surgeon: Daneil Dolin, MD;  Location: [facility];  Service: Endoscopy;  Laterality: N/A;  10:30 AM  . CYSTOSCOPY    . ERCP N/A 05/20/2013   RMR: cystic duct stump leak s/p cholecystectomy. +choledocholithiasis, s/p sphinterotomy, stent placement, stone extraction  . ERCP N/A 07/22/2013   RMR: S/P removal of biliary stent along with removal of common duct stones as described above, status post sphincterotomy balloon dilation and balloon dredging of the biliary tree Previously noted biliary leak sealed/Duodenal diverticulum  . ESOPHAGOGASTRODUODENOSCOPY N/A 06/08/2013   [place] esophageal erosions in the distal one half of the esophagus. 2 cm segment of markedly inflamed and swollen duodenal mucosa at the junction of the bulb and second portion. Significant encroachment on the lumen causing GOO. Bx: benign  Surgeon: Daneil Dolin, MD;  [facility];  Service: Endoscopy;  Laterality: N/A;  WITH PHENERGAN 12.5 mg IV  on call   . EXTRACORPOREAL SHOCK WAVE LITHOTRIPSY    . EYE SURGERY    . EYE SURGERY Left 35 years ago  . KIDNEY STONE SURGERY    . MELANOMA EXCISION Left 08/13   [facility]  . REMOVAL OF STONES N/A 07/22/2013   Procedure: REMOVAL OF common bile duct STONES;  Surgeon: Daneil Dolin, MD;  Location: [facility];  Service: Endoscopy;  Laterality: N/A;  . SPHINCTEROTOMY N/A 05/20/2013    Procedure: SPHINCTEROTOMY;  Surgeon: Daneil Dolin, MD;  Location: [facility];  Service: Gastroenterology;  Laterality: N/A;  . SPHINCTEROTOMY N/A 07/22/2013   Procedure: SPHINCTEROTOMY with dilation;  Surgeon: Daneil Dolin, MD;  Location: [facility];  Service: Endoscopy;  Laterality: N/A;  . STENT REMOVAL N/A 07/22/2013   Procedure: Biliary STENT REMOVAL;  Surgeon: Daneil Dolin, MD;  Location: [facility];  Service: Endoscopy;  Laterality: N/A;    Social History:  Social History   Socioeconomic History  . Marital status: Divorced    Spouse name: Not on file  . Number of children: Not on file  . Years of education: Not on file  . Highest education level: Not on file  Social Needs  . Financial resource strain: Not on file  . Food insecurity - worry: Not on file  . Food insecurity - inability: Not on file  . Transportation needs - medical: Not on file  . Transportation needs - non-medical: Not on file  Occupational History  . Not on file  Tobacco Use  . Smoking status: Current Every Day Smoker    Packs/day: 0.50    Years: 50.00    Pack years: 25.00  . Smokeless tobacco: Never Used  Substance and Sexual Activity  . Alcohol use: No    Alcohol/week: 0.0 oz  . Drug use: No    Comment: none since April  . Sexual activity: Yes    Birth control/protection: None  Other Topics Concern  . Not on file  Social History Narrative  . Not on file  The patient is single and lives in [place].   Family History: Family History  Problem Relation Age of Onset  . Cancer Father   . Cancer Brother   . Colon cancer Neg Hx     ALLERGIES: Patient has no known allergies.   MEDICATIONS:  Current Outpatient Medications  Medication Sig Dispense Refill  . losartan-hydrochlorothiazide (HYZAAR) 100-25 MG per tablet Take 1 tablet by mouth daily.    . ferrous sulfate 325 (65 FE) MG tablet Take 325 mg by mouth daily with breakfast.    . pantoprazole (PROTONIX) 40 MG tablet Take 1 tablet (40 mg total) by  mouth 2 (two) times daily. (Patient not taking: Reported on 11/25/2017) 60 tablet 5  . peg 3350 powder (MOVIPREP) 100 G SOLR Take 1 kit (200 g total) by mouth as directed. (Patient not taking: Reported on 11/25/2017) 1 kit 0   No current facility-administered medications for this encounter.      REVIEW OF SYSTEMS:  On review of systems, the patient reports that Sergio Schroeder is doing well overall. Sergio Schroeder denies any concerns about his incision site, or any pain since surgery. Sergio Schroeder had sutures removed last week.  Sergio Schroeder denies any chest pain, shortness of breath, cough, fevers, chills, night sweats, unintended weight changes. Sergio Schroeder denies any bowel or bladder disturbances, and denies abdominal pain, nausea or vomiting. Sergio Schroeder denies any new musculoskeletal or joint aches or pains, new skin lesions or concerns. A complete review of systems  is obtained and is otherwise negative.     PHYSICAL EXAM:  weight is 249 lb 9.6 oz (113.2 kg). His oral temperature is 98.3 F (36.8 C). His blood pressure is 165/92 (abnormal) and his pulse is 89. His respiration is 20 and oxygen saturation is 94%.    In general this is a well appearing caucasian male in no acute distress. Sergio Schroeder's alert and oriented x4 and appropriate throughout the examination. Cardiopulmonary assessment is negative for acute distress and Sergio Schroeder exhibits normal effort.  His right scalp incision is noted, and no infection was noted. It measures about and a half 4.5 cm in length, and is well healed. There are multiple prior excision sites along his left eye and right temple, as well as a small lesion along the right cheek.  ECOG = 0  0 - Asymptomatic (Fully active, able to carry on all predisease activities without restriction)  1 - Symptomatic but completely ambulatory (Restricted in physically strenuous activity but ambulatory and able to carry out work of a light or sedentary nature. For example, light housework, office work)  2 - Symptomatic, <50% in bed during the day  (Ambulatory and capable of all self care but unable to carry out any work activities. Up and about more than 50% of waking hours)  3 - Symptomatic, >50% in bed, but not bedbound (Capable of only limited self-care, confined to bed or chair 50% or more of waking hours)  4 - Bedbound (Completely disabled. Cannot carry on any self-care. Totally confined to bed or chair)  5 - Death   Eustace Pen MM, Creech RH, Tormey DC, et al. [phone]). "Toxicity and response criteria of the [facility]". [place] Oncol. 5 (6): 649-55     LABORATORY DATA:  Lab Results  Component Value Date   WBC 8.8 08/26/2013   HGB 13.3 08/26/2013   HCT 37.8 (L) 08/26/2013   MCV 94.3 08/26/2013   PLT 384 08/26/2013   Lab Results  Component Value Date   NA 141 07/07/2013   K 3.6 07/07/2013   CL 102 07/07/2013   CO2 26 07/07/2013   Lab Results  Component Value Date   ALT 33 07/07/2013   AST 24 07/07/2013   ALKPHOS 80 07/07/2013   BILITOT 0.6 07/07/2013      RADIOGRAPHY: No results found.     IMPRESSION/PLAN: 1. Squamous Cell Carcinoma of the scalp with perineural invasion. Dr. Lisbeth Renshaw discusses the pathology findings and reviews the nature of invasive squamous cell carcinoma and discusses the high risk features with the perineural invasion. Given this, we discussed the role for radiotherapy to the right scalp. We discussed the risks, benefits, short, and long term effects of radiotherapy, and the patient is interested in proceeding. Dr. Lisbeth Renshaw discusses the delivery and logistics of radiotherapy and anticipates a course of 4 weeks. We will set him up for simulation on the treatment machine on December 09, 2017. Written consent is obtained and placed in the chart, a copy was provided to the patient. 2. Right cheek lesion.  The patient will follow up in about 4 weeks with Dr. Tana Conch his primary dermatologist for evaluation and possible biopsy.   In a visit lasting 45 minutes, greater than 50% of  the time was spent face to face discussing his case, and coordinating the patient's care.  The above documentation reflects my direct findings during this shared patient visit. Please see the separate note by Dr. Lisbeth Renshaw on this date for the remainder of  the patient's plan of care.     Carola Rhine, [facility]   **Disclaimer: This note was dictated with voice recognition software. Similar sounding words can inadvertently be transcribed and this note may contain transcription errors which may not have been corrected upon publication of note.**  This document serves as a record of services personally performed by [name] MD and Shona Simpson, PA-C. It was created on their behalf by Delton Coombes, a trained medical scribe. The creation of this record is based on the scribe's personal observations and the provider's statements to them.

## 2017-12-04 ENCOUNTER — Encounter: Payer: Self-pay | Admitting: Radiation Oncology

## 2017-12-09 ENCOUNTER — Ambulatory Visit
Admission: RE | Admit: 2017-12-09 | Discharge: 2017-12-09 | Disposition: A | Discharge status: Home / Self Care | Payer: Medicare Other | Source: Ambulatory Visit | Attending: Radiation Oncology | Admitting: Radiation Oncology

## 2017-12-09 DIAGNOSIS — Z510 Encounter for antineoplastic radiation therapy: Secondary | ICD-10-CM | POA: Diagnosis not present

## 2017-12-10 ENCOUNTER — Ambulatory Visit
Admission: RE | Admit: 2017-12-10 | Discharge: 2017-12-10 | Disposition: A | Discharge status: Home / Self Care | Payer: Medicare Other | Source: Ambulatory Visit | Attending: Radiation Oncology | Admitting: Radiation Oncology

## 2017-12-10 DIAGNOSIS — Z510 Encounter for antineoplastic radiation therapy: Secondary | ICD-10-CM | POA: Diagnosis not present

## 2017-12-10 DIAGNOSIS — C4442 Squamous cell carcinoma of skin of scalp and neck: Secondary | ICD-10-CM

## 2017-12-11 ENCOUNTER — Ambulatory Visit
Admission: RE | Admit: 2017-12-11 | Discharge: 2017-12-11 | Disposition: A | Discharge status: Home / Self Care | Payer: Medicare Other | Source: Ambulatory Visit | Attending: Radiation Oncology | Admitting: Radiation Oncology

## 2017-12-11 DIAGNOSIS — Z510 Encounter for antineoplastic radiation therapy: Secondary | ICD-10-CM | POA: Diagnosis not present

## 2017-12-12 ENCOUNTER — Ambulatory Visit
Admission: RE | Admit: 2017-12-12 | Discharge: 2017-12-12 | Disposition: A | Discharge status: Home / Self Care | Payer: Medicare Other | Source: Ambulatory Visit | Attending: Radiation Oncology | Admitting: Radiation Oncology

## 2017-12-12 DIAGNOSIS — Z510 Encounter for antineoplastic radiation therapy: Secondary | ICD-10-CM | POA: Diagnosis not present

## 2017-12-12 DIAGNOSIS — C4442 Squamous cell carcinoma of skin of scalp and neck: Secondary | ICD-10-CM

## 2017-12-12 MED ORDER — SONAFINE EX EMUL
1.0000 "application " | Freq: Two times a day (BID) | CUTANEOUS | Status: DC
  Administered 2017-12-12: 1 via TOPICAL

## 2017-12-12 NOTE — Progress Notes (Signed)
Pt here for patient teaching.  Pt given Radiation and You booklet, skin care instructions and Sonafine.  Reviewed areas of pertinence such as hair loss, skin changes and headache . Pt able to give teach back of ,apply Sonafine bid and avoid applying anything to skin within 4 hours of treatment. Pt verbalizes understanding of information given and will contact nursing with any questions or concerns.     [email or web address]

## 2017-12-15 ENCOUNTER — Ambulatory Visit
Admission: RE | Admit: 2017-12-15 | Discharge: 2017-12-15 | Disposition: A | Discharge status: Home / Self Care | Payer: Medicare Other | Source: Ambulatory Visit | Attending: Radiation Oncology | Admitting: Radiation Oncology

## 2017-12-15 DIAGNOSIS — Z510 Encounter for antineoplastic radiation therapy: Secondary | ICD-10-CM | POA: Diagnosis not present

## 2017-12-17 ENCOUNTER — Ambulatory Visit
Admission: RE | Admit: 2017-12-17 | Discharge: 2017-12-17 | Disposition: A | Discharge status: Home / Self Care | Payer: Medicare Other | Source: Ambulatory Visit | Attending: Radiation Oncology | Admitting: Radiation Oncology

## 2017-12-17 DIAGNOSIS — Z510 Encounter for antineoplastic radiation therapy: Secondary | ICD-10-CM | POA: Diagnosis not present

## 2017-12-18 ENCOUNTER — Ambulatory Visit
Admission: RE | Admit: 2017-12-18 | Discharge: 2017-12-18 | Disposition: A | Discharge status: Home / Self Care | Payer: Medicare Other | Source: Ambulatory Visit | Attending: Radiation Oncology | Admitting: Radiation Oncology

## 2017-12-18 DIAGNOSIS — Z510 Encounter for antineoplastic radiation therapy: Secondary | ICD-10-CM | POA: Diagnosis not present

## 2017-12-19 ENCOUNTER — Ambulatory Visit
Admission: RE | Admit: 2017-12-19 | Discharge: 2017-12-19 | Disposition: A | Discharge status: Home / Self Care | Payer: Medicare Other | Source: Ambulatory Visit | Attending: Radiation Oncology | Admitting: Radiation Oncology

## 2017-12-19 DIAGNOSIS — Z510 Encounter for antineoplastic radiation therapy: Secondary | ICD-10-CM | POA: Diagnosis not present

## 2017-12-22 ENCOUNTER — Ambulatory Visit
Admission: RE | Admit: 2017-12-22 | Discharge: 2017-12-22 | Disposition: A | Discharge status: Home / Self Care | Payer: Medicare Other | Source: Ambulatory Visit | Attending: Radiation Oncology | Admitting: Radiation Oncology

## 2017-12-22 DIAGNOSIS — Z510 Encounter for antineoplastic radiation therapy: Secondary | ICD-10-CM | POA: Diagnosis not present

## 2017-12-24 ENCOUNTER — Ambulatory Visit
Admission: RE | Admit: 2017-12-24 | Discharge: 2017-12-24 | Disposition: A | Discharge status: Home / Self Care | Payer: Medicare Other | Source: Ambulatory Visit | Attending: Radiation Oncology | Admitting: Radiation Oncology

## 2017-12-24 DIAGNOSIS — K635 Polyp of colon: Secondary | ICD-10-CM | POA: Diagnosis not present

## 2017-12-24 DIAGNOSIS — Z510 Encounter for antineoplastic radiation therapy: Secondary | ICD-10-CM | POA: Diagnosis not present

## 2017-12-24 DIAGNOSIS — K219 Gastro-esophageal reflux disease without esophagitis: Secondary | ICD-10-CM | POA: Diagnosis not present

## 2017-12-24 DIAGNOSIS — D044 Carcinoma in situ of skin of scalp and neck: Secondary | ICD-10-CM | POA: Diagnosis not present

## 2017-12-24 DIAGNOSIS — F17210 Nicotine dependence, cigarettes, uncomplicated: Secondary | ICD-10-CM | POA: Diagnosis not present

## 2017-12-24 DIAGNOSIS — I10 Essential (primary) hypertension: Secondary | ICD-10-CM | POA: Diagnosis not present

## 2017-12-25 ENCOUNTER — Ambulatory Visit
Admission: RE | Admit: 2017-12-25 | Discharge: 2017-12-25 | Disposition: A | Discharge status: Home / Self Care | Payer: Medicare Other | Source: Ambulatory Visit | Attending: Radiation Oncology | Admitting: Radiation Oncology

## 2017-12-25 DIAGNOSIS — Z510 Encounter for antineoplastic radiation therapy: Secondary | ICD-10-CM | POA: Diagnosis not present

## 2017-12-26 ENCOUNTER — Ambulatory Visit
Admission: RE | Admit: 2017-12-26 | Discharge: 2017-12-26 | Disposition: A | Discharge status: Home / Self Care | Payer: Medicare Other | Source: Ambulatory Visit | Attending: Radiation Oncology | Admitting: Radiation Oncology

## 2017-12-26 DIAGNOSIS — Z510 Encounter for antineoplastic radiation therapy: Secondary | ICD-10-CM | POA: Diagnosis not present

## 2017-12-29 ENCOUNTER — Ambulatory Visit
Admission: RE | Admit: 2017-12-29 | Discharge: 2017-12-29 | Disposition: A | Discharge status: Home / Self Care | Payer: Medicare Other | Source: Ambulatory Visit | Attending: Radiation Oncology | Admitting: Radiation Oncology

## 2017-12-29 DIAGNOSIS — Z510 Encounter for antineoplastic radiation therapy: Secondary | ICD-10-CM | POA: Diagnosis not present

## 2017-12-30 ENCOUNTER — Ambulatory Visit
Admission: RE | Admit: 2017-12-30 | Discharge: 2017-12-30 | Disposition: A | Discharge status: Home / Self Care | Payer: Medicare Other | Source: Ambulatory Visit | Attending: Radiation Oncology | Admitting: Radiation Oncology

## 2017-12-30 DIAGNOSIS — Z510 Encounter for antineoplastic radiation therapy: Secondary | ICD-10-CM | POA: Diagnosis not present

## 2017-12-31 ENCOUNTER — Ambulatory Visit
Admission: RE | Admit: 2017-12-31 | Discharge: 2017-12-31 | Disposition: A | Discharge status: Home / Self Care | Payer: Medicare Other | Source: Ambulatory Visit | Attending: Radiation Oncology | Admitting: Radiation Oncology

## 2017-12-31 DIAGNOSIS — Z510 Encounter for antineoplastic radiation therapy: Secondary | ICD-10-CM | POA: Diagnosis not present

## 2018-01-01 ENCOUNTER — Ambulatory Visit
Admission: RE | Admit: 2018-01-01 | Discharge: 2018-01-01 | Disposition: A | Discharge status: Home / Self Care | Payer: Medicare Other | Source: Ambulatory Visit | Attending: Radiation Oncology | Admitting: Radiation Oncology

## 2018-01-01 DIAGNOSIS — Z510 Encounter for antineoplastic radiation therapy: Secondary | ICD-10-CM | POA: Diagnosis not present

## 2018-01-02 ENCOUNTER — Ambulatory Visit
Admission: RE | Admit: 2018-01-02 | Discharge: 2018-01-02 | Disposition: A | Discharge status: Home / Self Care | Payer: Medicare Other | Source: Ambulatory Visit | Attending: Radiation Oncology | Admitting: Radiation Oncology

## 2018-01-02 DIAGNOSIS — Z510 Encounter for antineoplastic radiation therapy: Secondary | ICD-10-CM | POA: Diagnosis not present

## 2018-01-05 ENCOUNTER — Ambulatory Visit
Admission: RE | Admit: 2018-01-05 | Discharge: 2018-01-05 | Disposition: A | Discharge status: Home / Self Care | Payer: Medicare Other | Source: Ambulatory Visit | Attending: Radiation Oncology | Admitting: Radiation Oncology

## 2018-01-05 DIAGNOSIS — Z510 Encounter for antineoplastic radiation therapy: Secondary | ICD-10-CM | POA: Diagnosis not present

## 2018-01-06 ENCOUNTER — Ambulatory Visit
Admission: RE | Admit: 2018-01-06 | Discharge: 2018-01-06 | Disposition: A | Discharge status: Home / Self Care | Payer: Medicare Other | Source: Ambulatory Visit | Attending: Radiation Oncology | Admitting: Radiation Oncology

## 2018-01-06 DIAGNOSIS — Z510 Encounter for antineoplastic radiation therapy: Secondary | ICD-10-CM | POA: Diagnosis not present

## 2018-01-07 ENCOUNTER — Ambulatory Visit
Admission: RE | Admit: 2018-01-07 | Discharge: 2018-01-07 | Disposition: A | Discharge status: Home / Self Care | Payer: Medicare Other | Source: Ambulatory Visit | Attending: Radiation Oncology | Admitting: Radiation Oncology

## 2018-01-07 DIAGNOSIS — Z510 Encounter for antineoplastic radiation therapy: Secondary | ICD-10-CM | POA: Diagnosis not present

## 2018-01-08 ENCOUNTER — Ambulatory Visit
Admission: RE | Admit: 2018-01-08 | Discharge: 2018-01-08 | Disposition: A | Discharge status: Home / Self Care | Payer: Medicare Other | Source: Ambulatory Visit | Attending: Radiation Oncology | Admitting: Radiation Oncology

## 2018-01-08 DIAGNOSIS — Z510 Encounter for antineoplastic radiation therapy: Secondary | ICD-10-CM | POA: Diagnosis not present

## 2018-01-09 ENCOUNTER — Ambulatory Visit
Admission: RE | Admit: 2018-01-09 | Discharge: 2018-01-09 | Disposition: A | Discharge status: Home / Self Care | Payer: Medicare Other | Source: Ambulatory Visit | Attending: Radiation Oncology | Admitting: Radiation Oncology

## 2018-01-09 DIAGNOSIS — Z510 Encounter for antineoplastic radiation therapy: Secondary | ICD-10-CM | POA: Diagnosis not present

## 2018-01-12 ENCOUNTER — Ambulatory Visit
Admission: RE | Admit: 2018-01-12 | Discharge: 2018-01-12 | Disposition: A | Discharge status: Home / Self Care | Payer: Medicare Other | Source: Ambulatory Visit | Attending: Radiation Oncology | Admitting: Radiation Oncology

## 2018-01-12 DIAGNOSIS — Z510 Encounter for antineoplastic radiation therapy: Secondary | ICD-10-CM | POA: Diagnosis not present

## 2018-01-13 ENCOUNTER — Encounter: Payer: Self-pay | Admitting: Radiation Oncology

## 2018-01-13 NOTE — Progress Notes (Signed)
  Radiation Oncology         [phone] ________________________________  Name: Sergio Schroeder MRN: [id number]  Date: 01/13/2018  DOB: 06-04-1946  End of Treatment Note  Diagnosis: Squamous cell carcinoma of skin of scalp and neck    Indication for treatment: Curative  Radiation treatment dates:  12/10/2017 - 01/12/2018  Site/dose:  Scalp/ 2.5 Gy per fx to 55 Gy  Beams/energy:  Electron with En Face technique/ 6 MeV  Narrative: The patient tolerated radiation treatment relatively well.     Plan: The patient has completed radiation treatment. The patient will return to radiation oncology clinic for routine followup in one month. I advised them to call or return sooner if they have any questions or concerns related to their recovery or treatment.  ------------------------------------------------  Jodelle Gross, MD, PhD  This document serves as a record of services personally performed by [name], MD. It was created on his behalf by Valeta Harms, a trained medical scribe. The creation of this record is based on the scribe's personal observations and the provider's statements to them. This document has been checked and approved by the attending provider.

## 2018-01-22 NOTE — Progress Notes (Signed)
Simulation verification  The patient was brought to the treatment machine and placed in the plan treatment position.  Clinical set up was verified to ensure that the target region is appropriately covered for the patient's upcoming electron treatment.  The targeted volume of tissue is appropriately covered by the radiation field.  Based on my personal review, I approve the simulation verification.  The patient's treatment will proceed as planned.  ------------------------------------------------  Jodelle Gross, MD, PhD

## 2018-02-11 ENCOUNTER — Encounter: Payer: Self-pay | Admitting: Radiation Oncology

## 2018-02-11 ENCOUNTER — Ambulatory Visit
Admission: RE | Admit: 2018-02-11 | Discharge: 2018-02-11 | Disposition: A | Discharge status: Home / Self Care | Payer: Medicare Other | Source: Ambulatory Visit | Attending: Radiation Oncology | Admitting: Radiation Oncology

## 2018-02-11 VITALS — BP 149/83 | HR 91 | Temp 98.2°F | Resp 20 | Ht 69.0 in | Wt 257.8 lb

## 2018-02-11 DIAGNOSIS — C4442 Squamous cell carcinoma of skin of scalp and neck: Secondary | ICD-10-CM | POA: Insufficient documentation

## 2018-02-11 DIAGNOSIS — Z79899 Other long term (current) drug therapy: Secondary | ICD-10-CM | POA: Diagnosis not present

## 2018-02-11 DIAGNOSIS — Z85820 Personal history of malignant melanoma of skin: Secondary | ICD-10-CM | POA: Insufficient documentation

## 2018-02-11 NOTE — Addendum Note (Signed)
Encounter addended by: Malena Edman, RN on: 02/11/2018 10:35 AM  Actions taken: Charge Capture section accepted

## 2018-02-11 NOTE — Progress Notes (Signed)
  Radiation Oncology         [phone] ________________________________  Name: Sergio Schroeder MRN: [id number]  Date of Service: 02/11/2018 DOB: 16-Mar-1946  Post Treatment Note  CC: Marjean Donna, MD (Inactive)  Izora Ribas, MD  Diagnosis:   Squamous cell carcinoma of skin of scalp and neck     Interval Since Last Radiation:  4 weeks   12/10/2017 - 01/12/2018: Scalp/ 2.5 Gy per fx to 55 Gy   Narrative:  The patient returns today for routine follow-up.  [name] has a history of melanoma which was excised from his left lateral torso many years ago and since he has been NED. He also has a history of squamous cell carcinoma, and has had multiple Mohs procedures for this including along the right temple and left lower eye in 06/09/17. He also had multiple sites which were biopsied on 10/16/17 revealing squamous cell carcinoma including: Left shoulder SCC, nodular type Right Scalp SCC, nodular type Left Scalp SCC in situ Mid Abdomen  Lesion SCC in situ  He underwent Mohs procedure to the right scalp on 11/10/17, and final pathology revealed invasive squamous cell carcinoma with perineural invasion of the involved nerve 0.25 mm. Of note, the site was measured preoperatively at 1.7 x 1.2 cm.  Given the finding, he was offered radiotherapy and completed.                                On review of systems, the patient states he's doing well overall. He denies any concerns with new lesions of his skin but reports he needs to get back in with Dr. Denna Haggard for evaluation. No other complaints are noted.   ALLERGIES:  has No Known Allergies.  Meds: Current Outpatient Medications  Medication Sig Dispense Refill  . losartan-hydrochlorothiazide (HYZAAR) 100-25 MG per tablet Take 1 tablet by mouth daily.     No current facility-administered medications for this encounter.     Physical Findings:  height is 5\' 9"  (1.753 m) and weight is 257 lb 12.8 oz (116.9 kg). His oral temperature is  98.2 F (36.8 C). His blood pressure is 149/83 (abnormal) and his pulse is 91. His respiration is 20 and oxygen saturation is 96%.  Pain Assessment Pain Score: 0-No pain/10 In general this is a well appearing caucasian male in no acute distress. He's alert and oriented x4 and appropriate throughout the examination. Cardiopulmonary assessment is negative for acute distress and he exhibits normal effort. No desquamation is noted.  Lab Findings: Lab Results  Component Value Date   WBC 8.8 08/26/2013   HGB 13.3 08/26/2013   HCT 37.8 (L) 08/26/2013   MCV 94.3 08/26/2013   PLT 384 08/26/2013     Radiographic Findings: No results found.  Impression/Plan: 1. Squamous cell carcinoma of skin of scalp. The patient is doing well since completing radiotherapy to his post surgical site in the scalp. He will return for evaluation and will contact Dr. Denna Haggard for evaluation in the next few months. We would be happy to see him moving forward as needed, or if additional treatment is indicated.       Carola Rhine, PAC

## 2018-04-21 ENCOUNTER — Other Ambulatory Visit: Payer: Self-pay | Admitting: Dermatology

## 2018-07-27 ENCOUNTER — Other Ambulatory Visit: Payer: Self-pay | Admitting: Dermatology

## 2018-08-12 ENCOUNTER — Encounter: Payer: Self-pay | Admitting: Internal Medicine

## 2018-12-28 ENCOUNTER — Other Ambulatory Visit: Payer: Self-pay | Admitting: Dermatology

## 2020-02-16 ENCOUNTER — Other Ambulatory Visit: Payer: Self-pay | Admitting: Dermatology

## 2020-03-09 ENCOUNTER — Encounter: Payer: Self-pay | Admitting: *Deleted

## 2020-03-09 ENCOUNTER — Other Ambulatory Visit: Payer: Self-pay

## 2020-03-09 ENCOUNTER — Ambulatory Visit (INDEPENDENT_AMBULATORY_CARE_PROVIDER_SITE_OTHER): Payer: Medicare Other | Admitting: Dermatology

## 2020-03-09 DIAGNOSIS — C44329 Squamous cell carcinoma of skin of other parts of face: Secondary | ICD-10-CM | POA: Diagnosis not present

## 2020-03-09 DIAGNOSIS — C4492 Squamous cell carcinoma of skin, unspecified: Secondary | ICD-10-CM

## 2020-03-09 NOTE — Progress Notes (Addendum)
Follow-Up Visit   Subjective  Sergio Schroeder is a 75 y.o. male who presents for the following: Skin Cancer (treatment scc x3 left ear rim,left sideburn superior + discuss mohs on the other spots. patient last seen 02/16/2020).  Skin cancers Location: left face, ear Duration: one year Quality:  Associated Signs/Symptoms: Modifying Factors:  Severity:  Timing: Context: for treatment  The following portions of the chart were reviewed this encounter and updated as appropriate:     Objective  Well appearing patient in no apparent distress; mood and affect are within normal limits.  A focused examination was performed including face and ears. Relevant physical exam findings are noted in the Assessment and Plan.  Objective  Left sideburn inferior: Biospy proven SCCA-KA. Curettage showed deep extension; electrocauterized, re-curetted, narrow margin excision, layered vicryl-ethilon 5/0 closure. EBL 2cc.  Objective  Left Temple: 1cm erosion; curettage x3 showed width>depth, base innoculated with parenteral 5FU.f  Objective  Left Scaphoid Fossa: SCCA-KA, 2cm with ill-define margins. I'd prefer Mohs but patient requested I remove. Debrided with blade, curetted x3, cauterized base, re-curetted and base innoculated with parenteral 5FU.  Assessment & Plan  Squamous cell carcinoma of skin (3) Left sideburn inferior  Skin excision  Lesion length (cm):  2 Lesion width (cm):  1 Margin per side (cm):  0 Total excision diameter (cm):  2 Informed consent: discussed and consent obtained   Timeout: patient name, date of birth, surgical site, and procedure verified   Procedure prep:  Patient was prepped and draped in usual sterile fashion Prep type:  Chlorhexidine Anesthesia: the lesion was anesthetized in a standard fashion   Anesthetic:  1% lidocaine w/ epinephrine 1-100,000 local infiltration Instrument used: #15 blade   Hemostasis achieved with: suture, pressure and  electrodesiccation   Outcome: patient tolerated procedure well with no complications   Post-procedure details: sterile dressing applied and wound care instructions given   Dressing type: bacitracin and pressure dressing   Additional details:  5/0 vicryl plus 5/0 nylon  Skin repair Complexity:  Intermediate Informed consent: discussed and consent obtained   Reason for type of repair: reduce tension to allow closure, reduce the risk of dehiscence, infection, and necrosis, reduce subcutaneous dead space and avoid a hematoma, allow closure of the large defect, preserve normal anatomy, preserve normal anatomical and functional relationships and enhance both functionality and cosmetic results   Subcutaneous layers (deep stitches):  Suture size:  5-0 Suture type: Vicryl (polyglactin 910)   Stitches:  Buried vertical mattress Fine/surface layer approximation (top stitches):  Suture size:  5-0 Suture type comment:  Nylon Stitches: simple interrupted   Hemostasis achieved with: suture and pressure Post-procedure details: sterile dressing applied and wound care instructions given   Dressing type: bandage and petrolatum    Specimen 1 - Surgical pathology Differential Diagnosis: scc KA  Check Margins: Inferior margin   Left Temple  Destruction of lesion Complexity: simple   Destruction method: electrodesiccation and curettage   Destruction method comment:  Curettage plus innoculation with parenteral fluorouracil Informed consent: discussed and consent obtained   Timeout:  patient name, date of birth, surgical site, and procedure verified Anesthesia: the lesion was anesthetized in a standard fashion   Anesthetic:  1% lidocaine w/ epinephrine 1-100,000 local infiltration Curettage performed in three different directions: Yes   Curettage cycles:  3 Lesion length (cm):  2 Lesion width (cm):  1 Margin per side (cm):  0 Final wound size (cm):  2 Hemostasis achieved with:  ferric  subsulfate  Outcome: patient tolerated procedure well with no complications   Post-procedure details: wound care instructions given   Additional details:  Inoculated with parenteral 5% fluorouracil  Left Scaphoid Fossa  Destruction of lesion Complexity: simple   Destruction method: electrodesiccation and curettage   Informed consent: discussed and consent obtained   Timeout:  patient name, date of birth, surgical site, and procedure verified Anesthesia: the lesion was anesthetized in a standard fashion   Anesthetic:  1% lidocaine w/ epinephrine 1-100,000 local infiltration Curettage performed in three different directions: Yes   Electrodesiccation performed over the curetted area: Yes   Curettage cycles:  3 Lesion length (cm):  2 Lesion width (cm):  2 Margin per side (cm):  0 Final wound size (cm):  2 Hemostasis achieved with:  ferric subsulfate Outcome: patient tolerated procedure well with no complications   Post-procedure details: wound care instructions given   Additional details:  Bulky lesion, blade debride, curettage x3, cautery, re-curet, innoculate 5FU

## 2020-03-09 NOTE — Patient Instructions (Addendum)
Biopsy Wound Care Instructions  1. Leave the original bandage on for 24 hours if possible.  If the bandage becomes soaked or soiled before that time, it is OK to remove it and examine the wound.  A small amount of post-operative bleeding is normal.  If excessive bleeding occurs, remove the bandage, place gauze over the site and apply continuous pressure (no peeking) over the area for 30 minutes. If this does not work, please call our clinic as soon as possible or page your doctor if it is after hours.   2. Once a day, cleanse the wound with soap and water. It is fine to shower. If a thick crust develops you may use a Q-tip dipped into dilute hydrogen peroxide (mix 1:1 with water) to dissolve it.  Hydrogen peroxide can slow the healing process, so use it only as needed.    3. After washing, apply petroleum jelly (Vaseline) or an antibiotic ointment if your doctor prescribed one for you, followed by a bandage.    4. For best healing, the wound should be covered with a layer of ointment at all times. If you are not able to keep the area covered with a bandage to hold the ointment in place, this may mean re-applying the ointment several times a day.  Continue this wound care until the wound has healed and is no longer open.   Itching and mild discomfort is normal during the healing process. However, if you develop pain or severe itching, please call our office.   If you have any discomfort, you can take Tylenol (acetaminophen) or ibuprofen as directed on the bottle. (Please do not take these if you have an allergy to them or cannot take them for another reason).  Some redness, tenderness and white or yellow material in the wound is normal healing.  If the area becomes very sore and red, or develops a thick yellow-green material (pus), it may be infected; please notify us.    If you have stitches, return to clinic as directed to have the stitches removed. You will continue wound care for 2-3 days after  the stitches are removed.   Wound healing continues for up to one year following surgery. It is not unusual to experience pain in the scar from time to time during the interval.  If the pain becomes severe or the scar thickens, you should notify the office.    A slight amount of redness in a scar is expected for the first six months.  After six months, the redness will fade and the scar will soften and fade.  The color difference becomes less noticeable with time.  If there are any problems, return for a post-op surgery check at your earliest convenience.  To improve the appearance of the scar, you can use silicone scar gel, cream, or sheets (such as Mederma or Serica) every night for up to one year. These are available over the counter (without a prescription).  Please call our office for any questions or concerns.  Dr. Denna Haggard: my cell phone is [phone]; call me if there are any issues.

## 2020-03-10 ENCOUNTER — Encounter: Payer: Self-pay | Admitting: *Deleted

## 2020-03-20 ENCOUNTER — Other Ambulatory Visit: Payer: Self-pay

## 2020-03-20 ENCOUNTER — Ambulatory Visit (INDEPENDENT_AMBULATORY_CARE_PROVIDER_SITE_OTHER): Payer: Medicare Other | Admitting: *Deleted

## 2020-03-20 DIAGNOSIS — C4492 Squamous cell carcinoma of skin, unspecified: Secondary | ICD-10-CM

## 2020-03-20 DIAGNOSIS — Z4802 Encounter for removal of sutures: Secondary | ICD-10-CM

## 2020-03-20 NOTE — Progress Notes (Signed)
Patient here for suture removal times 4, no sign or symptoms of infection, path to patient. Patient schedule 3 month follow up.

## 2020-05-01 NOTE — Addendum Note (Signed)
Addended by: Zenia Resides on: 05/01/2020 02:27 PM   Modules accepted: Level of Service

## 2020-05-05 NOTE — Addendum Note (Signed)
Addended by: [name] on: 05/05/2020 11:47 AM   Modules accepted: Orders

## 2020-06-12 ENCOUNTER — Encounter: Payer: Self-pay | Admitting: *Deleted

## 2020-06-19 ENCOUNTER — Ambulatory Visit: Payer: Medicare Other | Admitting: Dermatology

## 2020-06-27 ENCOUNTER — Other Ambulatory Visit: Payer: Self-pay

## 2020-06-27 ENCOUNTER — Encounter: Payer: Self-pay | Admitting: Dermatology

## 2020-06-27 ENCOUNTER — Ambulatory Visit (INDEPENDENT_AMBULATORY_CARE_PROVIDER_SITE_OTHER): Payer: Medicare Other | Admitting: Dermatology

## 2020-06-27 DIAGNOSIS — D485 Neoplasm of uncertain behavior of skin: Secondary | ICD-10-CM | POA: Diagnosis not present

## 2020-06-27 DIAGNOSIS — L570 Actinic keratosis: Secondary | ICD-10-CM

## 2020-06-27 DIAGNOSIS — C4492 Squamous cell carcinoma of skin, unspecified: Secondary | ICD-10-CM

## 2020-06-27 HISTORY — DX: Squamous cell carcinoma of skin, unspecified: C44.92

## 2020-06-27 NOTE — Patient Instructions (Addendum)
Routine follow-up for Sergio Schroeder date of birth [birth year].  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  Each of these was shave biopsy and deep keratin extension noted.  We will decide on the best way to treat these once the biopsy is back; if Sergio Schroeder could call my office on Thursday or Friday we will discuss this.  He did try the oral niacinamide but developed leg cramps and discontinued it.  These eruptive KA type spots may do well with acitretin (Soriatane).  We will try to check a laboratory study which he had done 3 months ago as a baseline.  If this is acceptable and if we could get him this medication at an affordable price, he will take 1 pill 25 mg daily with food.  Possible side effects could include dryness, headache, easier sunburn, and elevation of blood fats particularly triglyceride.  If he obtains the medicine we'll arrange [place] 6 [place]; no alcohol for 2 days before the blood test and no simple sugars for 1day.  He knows he can call me if there are any problems or questions.  Biopsy, Surgery (Curettage) & Surgery (Excision) Aftercare Instructions  1. Okay to remove bandage in 24 hours  2. Wash area with soap and water  3. Apply Vaseline to area twice daily until healed (Not Neosporin)  4. Okay to cover with a Band-Aid to decrease the chance of infection or prevent irritation from clothing; also it's okay to uncover lesion at home.  5. Suture instructions: return to our office in 7-10 or 10-14 days for a nurse visit for suture removal. Variable healing with sutures, if pain or itching occurs call our office. It's okay to shower or bathe 24 hours after sutures are given.  6. The following risks may occur after a biopsy, curettage or excision:  bleeding, scarring, discoloration, recurrence, infection (redness, yellow drainage, pain or swelling).  7. For questions, concerns and results call our office at [place] before 4pm & Friday before 3pm. Biopsy results will be available in 1 week.

## 2020-06-28 ENCOUNTER — Encounter: Payer: Self-pay | Admitting: Dermatology

## 2020-06-28 NOTE — Progress Notes (Signed)
Follow-Up Visit   Subjective  Sergio Schroeder is a 74 y.o. male who presents for the following: Follow-up (RIGHT EAR KA?, LEFT  EAR, TOP OF BOTH HANDS KA?).  New growths Location: Dorsal hands, left scalp, right ear Duration:  Quality:  Associated Signs/Symptoms: Modifying Factors:  Severity:  Timing: Context: History of dozens of nonmelanoma skin cancers  The following portions of the chart were reviewed this encounter and updated as appropriate:     Objective  Well appearing patient in no apparent distress; mood and affect are within normal limits.  A focused examination was performed including Head, neck, face, arms. Relevant physical exam findings are noted in the Assessment and Plan.   Assessment & Plan  Neoplasm of uncertain behavior of skin (4) Left Hand - Posterior  Skin / nail biopsy Type of biopsy: tangential   Informed consent: discussed and consent obtained   Timeout: patient name, date of birth, surgical site, and procedure verified   Procedure prep:  Patient was prepped and draped in usual sterile fashion Prep type:  Chlorhexidine Anesthesia: the lesion was anesthetized in a standard fashion   Anesthetic:  1% lidocaine w/ epinephrine 1-100,000 local infiltration Instrument used: flexible razor blade   Hemostasis achieved with: ferric subsulfate   Outcome: patient tolerated procedure well   Post-procedure details: wound care instructions given    Specimen 1 - Surgical pathology Differential Diagnosis: KA Check Margins: No  Right Hand - Posterior  Skin / nail biopsy Type of biopsy: tangential   Informed consent: discussed and consent obtained   Timeout: patient name, date of birth, surgical site, and procedure verified   Procedure prep:  Patient was prepped and draped in usual sterile fashion Prep type:  Chlorhexidine Anesthesia: the lesion was anesthetized in a standard fashion   Anesthetic:  1% lidocaine w/ epinephrine 1-100,000 local  infiltration Instrument used: flexible razor blade   Hemostasis achieved with: ferric subsulfate   Outcome: patient tolerated procedure well   Post-procedure details: wound care instructions given    Specimen 2 - Surgical pathology Differential Diagnosis: KA Check Margins: No  Scalp  Skin / nail biopsy Type of biopsy: tangential   Informed consent: discussed and consent obtained   Timeout: patient name, date of birth, surgical site, and procedure verified   Procedure prep:  Patient was prepped and draped in usual sterile fashion Prep type:  Chlorhexidine Anesthesia: the lesion was anesthetized in a standard fashion   Anesthetic:  1% lidocaine w/ epinephrine 1-100,000 local infiltration Instrument used: flexible razor blade   Hemostasis achieved with: ferric subsulfate   Outcome: patient tolerated procedure well   Post-procedure details: wound care instructions given    Specimen 3 - Surgical pathology Differential Diagnosis: KA Check Margins: No  Right EAR RIM  Skin / nail biopsy Type of biopsy: tangential   Informed consent: discussed and consent obtained   Timeout: patient name, date of birth, surgical site, and procedure verified   Procedure prep:  Patient was prepped and draped in usual sterile fashion Prep type:  Chlorhexidine Anesthesia: the lesion was anesthetized in a standard fashion   Anesthetic:  1% lidocaine w/ epinephrine 1-100,000 local infiltration Instrument used: flexible razor blade   Hemostasis achieved with: ferric subsulfate   Outcome: patient tolerated procedure well   Post-procedure details: wound care instructions given    Specimen 4 - Surgical pathology Differential Diagnosis: KA Check Margins: No Routine follow-up for Sergio Schroeder date of birth 08/18/1946.  Once again he  has grown a number of painful volcano shaped nodules, the 4 largest and most bothersome are on the top of each hand, the right mid helix, and the left frontal crown  of the scalp.  Each of these was shave biopsy and deep keratin extension noted.  We will decide on the best way to treat these once the biopsy is back; if Mr. Cappello could call my office on Thursday or Friday we will discuss this.  He did try the oral niacinamide but developed leg cramps and discontinued it.  These eruptive KA type spots may do well with acitretin (Soriatane).  We will try to check a laboratory study which he had done 3 months ago as a baseline.  If this is acceptable and if we could get him this medication at an affordable price, he will take 1 pill 25 mg daily with food.  Possible side effects could include dryness, headache, easier sunburn, and elevation of blood fats particularly triglyceride.  If he obtains the medicine we'll arrange for a fasting blood [place]( [place] in 6 weeks; no alcohol for 2 days before the blood test and no simple sugars for 1day.  He knows he can call me if there are any problems or questions.

## 2020-06-29 ENCOUNTER — Telehealth: Payer: Self-pay

## 2020-06-29 NOTE — Telephone Encounter (Signed)
-----   Message from [name], MD sent at 06/29/2020  6:24 AM EDT ----- Last surgery AM or PM

## 2020-06-29 NOTE — Telephone Encounter (Signed)
Phone call to patient with his pathology results. Voicemail left for patient to give the office a call back.  ?

## 2020-06-30 NOTE — Telephone Encounter (Signed)
Patient is returning call about pathology results. 

## 2020-06-30 NOTE — Telephone Encounter (Signed)
Path to patient made a surgery appointment for July 22 at 4 with Dr. Denna Haggard.

## 2020-07-13 ENCOUNTER — Other Ambulatory Visit: Payer: Self-pay

## 2020-07-13 ENCOUNTER — Ambulatory Visit (INDEPENDENT_AMBULATORY_CARE_PROVIDER_SITE_OTHER): Payer: Medicare Other | Admitting: Dermatology

## 2020-07-13 DIAGNOSIS — C44629 Squamous cell carcinoma of skin of left upper limb, including shoulder: Secondary | ICD-10-CM | POA: Diagnosis not present

## 2020-07-13 DIAGNOSIS — C4442 Squamous cell carcinoma of skin of scalp and neck: Secondary | ICD-10-CM | POA: Diagnosis not present

## 2020-07-13 DIAGNOSIS — C44622 Squamous cell carcinoma of skin of right upper limb, including shoulder: Secondary | ICD-10-CM

## 2020-07-13 DIAGNOSIS — C4492 Squamous cell carcinoma of skin, unspecified: Secondary | ICD-10-CM

## 2020-08-22 ENCOUNTER — Encounter: Payer: Self-pay | Admitting: Dermatology

## 2020-09-08 NOTE — Progress Notes (Signed)
   Follow-Up Visit   Subjective  Sergio Schroeder is a 74 y.o. male who presents for the following: Procedure (SCC curete).  Skin cancers Location: Scalp and hands Duration:  Quality:  Associated Signs/Symptoms: Modifying Factors:  Severity:  Timing: Context:   Objective  Well appearing patient in no apparent distress; mood and affect are within normal limits.  A focused examination was performed including Head, neck, arms, hands.. Relevant physical exam findings are noted in the Assessment and Plan.   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  Destruction of lesion Complexity: simple   Destruction method: electrodesiccation and curettage   Informed consent: discussed and consent obtained   Timeout:  patient name, date of birth, surgical site, and procedure verified Anesthesia: the lesion was anesthetized in a standard fashion   Anesthetic:  1% lidocaine w/ epinephrine 1-100,000 local infiltration Curettage performed in three different directions: Yes   Curettage cycles:  3 Lesion length (cm):  1.4 Lesion width (cm):  1 Margin per side (cm):  0 Final wound size (cm):  1.4 Hemostasis achieved with:  ferric subsulfate Outcome: patient tolerated procedure well with no complications   Post-procedure details: wound care instructions given   Additional details:  Inoculated with parenteral 5% fluorouracil  Left Hand - Posterior  Destruction of lesion Complexity: simple   Destruction method: electrodesiccation and curettage   Informed consent: discussed and consent obtained   Timeout:  patient name, date of birth, surgical site, and procedure verified Anesthesia: the lesion was anesthetized in a standard fashion   Anesthetic:  1% lidocaine w/ epinephrine 1-100,000 local infiltration Curettage performed in three different directions: Yes   Curettage cycles:  3 Lesion length (cm):  1.2 Lesion width (cm):  1 Margin per side (cm):   0 Final wound size (cm):  1.2 Hemostasis achieved with:  ferric subsulfate Outcome: patient tolerated procedure well with no complications   Post-procedure details: wound care instructions given   Additional details:  Inoculated with parenteral 5% fluorouracil  Left Parietal Scalp  Destruction of lesion Complexity: simple   Destruction method: electrodesiccation and curettage   Informed consent: discussed and consent obtained   Timeout:  patient name, date of birth, surgical site, and procedure verified Anesthesia: the lesion was anesthetized in a standard fashion   Anesthetic:  1% lidocaine w/ epinephrine 1-100,000 local infiltration Curettage performed in three different directions: Yes   Curettage cycles:  3 Lesion length (cm):  2.2 Lesion width (cm):  1 Margin per side (cm):  0 Final wound size (cm):  2.2 Hemostasis achieved with:  ferric subsulfate Outcome: patient tolerated procedure well with no complications   Post-procedure details: wound care instructions given   Additional details:  Inoculated with parenteral 5% fluorouracil     I, [name], MD, have reviewed all documentation for this visit.  The documentation on 09/08/20 for the exam, diagnosis, procedures, and orders are all accurate and complete.

## 2020-09-14 ENCOUNTER — Other Ambulatory Visit: Payer: Self-pay

## 2020-09-14 ENCOUNTER — Encounter: Payer: Self-pay | Admitting: Dermatology

## 2020-09-14 ENCOUNTER — Ambulatory Visit (INDEPENDENT_AMBULATORY_CARE_PROVIDER_SITE_OTHER): Payer: Medicare Other | Admitting: Dermatology

## 2020-09-14 DIAGNOSIS — D485 Neoplasm of uncertain behavior of skin: Secondary | ICD-10-CM

## 2020-09-14 DIAGNOSIS — D044 Carcinoma in situ of skin of scalp and neck: Secondary | ICD-10-CM | POA: Diagnosis not present

## 2020-09-14 DIAGNOSIS — C4492 Squamous cell carcinoma of skin, unspecified: Secondary | ICD-10-CM

## 2020-09-14 DIAGNOSIS — C44329 Squamous cell carcinoma of skin of other parts of face: Secondary | ICD-10-CM | POA: Diagnosis not present

## 2020-09-14 NOTE — Patient Instructions (Signed)

## 2020-09-25 ENCOUNTER — Encounter: Payer: Self-pay | Admitting: *Deleted

## 2020-09-25 ENCOUNTER — Telehealth: Payer: Self-pay | Admitting: *Deleted

## 2020-09-25 NOTE — Telephone Encounter (Signed)
Left message for patient to call us back.  

## 2020-09-25 NOTE — Telephone Encounter (Signed)
-----   Message from [name], MD sent at 09/22/2020  7:09 AM EDT ----- May already be scheduled for surgical follow-up with Dr. Darene Lamer but if the Mohs surgeon in [place] (where he is likely already scheduled for another lesion) wants to treat these this would be okay with me.

## 2020-09-25 NOTE — Telephone Encounter (Signed)
Pathology to patient, surgery scheduled  

## 2020-10-18 NOTE — Progress Notes (Signed)
Follow-Up Visit   Subjective  Sergio Schroeder is a 74 y.o. male who presents for the following: Procedure (here for treatment- left hand- posterior, right hand-posterior & scalp - scc x 3).  Follow up Location:  Duration:  Quality:  Associated Signs/Symptoms: Modifying Factors:  Severity:  Timing: Context:   Objective  Well appearing patient in no apparent distress; mood and affect are within normal limits.  All skin waist up examined.   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) Mid Forehead  Destruction of lesion Complexity: simple   Destruction method: electrodesiccation and curettage   Informed consent: discussed and consent obtained   Timeout:  patient name, date of birth, surgical site, and procedure verified Anesthesia: the lesion was anesthetized in a standard fashion   Anesthetic:  1% lidocaine w/ epinephrine 1-100,000 local infiltration Curettage performed in three different directions: Yes   Electrodesiccation performed over the curetted area: Yes   Curettage cycles:  3 Lesion length (cm):  1 Lesion width (cm):  1 Final wound size (cm):  1 Hemostasis achieved with:  ferric subsulfate Outcome: patient tolerated procedure well with no complications   Post-procedure details: sterile dressing applied and wound care instructions given   Dressing type: bandage and petrolatum   Additional details:  Wound inoculated with 5% fluorouracil.   Specimen 1 - Surgical pathology Differential Diagnosis: BCC SCC Check Margins: No  Mid Parietal Scalp - Anterior  Destruction of lesion Complexity: simple   Destruction method: electrodesiccation and curettage   Informed consent: discussed and consent obtained   Timeout:  patient name, date of birth, surgical site, and procedure verified Anesthesia: the lesion was anesthetized in a standard fashion   Anesthetic:  1% lidocaine w/ epinephrine 1-100,000 local infiltration Curettage performed in  three different directions: Yes   Electrodesiccation performed over the curetted area: Yes   Curettage cycles:  3 Lesion length (cm):  0.8 Lesion width (cm):  0.8 Margin per side (cm):  0 Final wound size (cm):  0.8 Hemostasis achieved with:  ferric subsulfate Outcome: patient tolerated procedure well with no complications   Post-procedure details: sterile dressing applied and wound care instructions given   Dressing type: bandage and petrolatum   Additional details:  Wound inoculated with 5% Fluorouracil.   Specimen 2 - Surgical pathology Differential Diagnosis: BCC SCC Check Margins: No  Mid Parietal Scalp - Posterior  Destruction of lesion Complexity: simple   Destruction method: electrodesiccation and curettage   Informed consent: discussed and consent obtained   Timeout:  patient name, date of birth, surgical site, and procedure verified Anesthesia: the lesion was anesthetized in a standard fashion   Anesthetic:  1% lidocaine w/ epinephrine 1-100,000 local infiltration Curettage performed in three different directions: Yes   Electrodesiccation performed over the curetted area: Yes   Curettage cycles:  3 Lesion length (cm):  1.2 Lesion width (cm):  1 Margin per side (cm):  0 Final wound size (cm):  1.2 Hemostasis achieved with:  ferric subsulfate Outcome: patient tolerated procedure well with no complications   Post-procedure details: sterile dressing applied and wound care instructions given   Dressing type: bandage and petrolatum   Additional details:  Wound inoculated with 5% fluorouracil.   Specimen 3 - Surgical pathology Differential Diagnosis: BCC SCC Check Margins: No  Follow up in 3-4 months.      I, Sergio Monarch, MD, have reviewed all documentation for this visit.  The documentation on 10/21/20 for the exam, diagnosis,  procedures, and orders are all accurate and complete.

## 2020-10-21 ENCOUNTER — Encounter: Payer: Self-pay | Admitting: Dermatology

## 2020-11-07 NOTE — Addendum Note (Signed)
Addended by: [name] on: 11/07/2020 08:25 PM   Modules accepted: Orders

## 2020-11-30 ENCOUNTER — Encounter: Payer: Medicare Other | Admitting: Dermatology

## 2020-12-11 ENCOUNTER — Ambulatory Visit: Payer: Medicare Other | Admitting: Dermatology

## 2021-06-28 ENCOUNTER — Emergency Department (HOSPITAL_COMMUNITY): Payer: Medicare Other

## 2021-06-28 ENCOUNTER — Inpatient Hospital Stay (HOSPITAL_COMMUNITY)
Admission: EM | Admit: 2021-06-28 | Discharge: 2021-07-02 | DRG: 659 | Disposition: A | Discharge status: Home / Self Care | Payer: Medicare Other | Attending: Internal Medicine | Admitting: Internal Medicine

## 2021-06-28 ENCOUNTER — Other Ambulatory Visit: Payer: Self-pay

## 2021-06-28 ENCOUNTER — Encounter (HOSPITAL_COMMUNITY): Payer: Self-pay | Admitting: Emergency Medicine

## 2021-06-28 ENCOUNTER — Observation Stay (HOSPITAL_COMMUNITY): Payer: Medicare Other

## 2021-06-28 DIAGNOSIS — I10 Essential (primary) hypertension: Secondary | ICD-10-CM | POA: Diagnosis not present

## 2021-06-28 DIAGNOSIS — Z20822 Contact with and (suspected) exposure to covid-19: Secondary | ICD-10-CM | POA: Diagnosis present

## 2021-06-28 DIAGNOSIS — Z9049 Acquired absence of other specified parts of digestive tract: Secondary | ICD-10-CM

## 2021-06-28 DIAGNOSIS — Z66 Do not resuscitate: Secondary | ICD-10-CM | POA: Diagnosis present

## 2021-06-28 DIAGNOSIS — F17210 Nicotine dependence, cigarettes, uncomplicated: Secondary | ICD-10-CM | POA: Diagnosis present

## 2021-06-28 DIAGNOSIS — Y92531 Health care provider office as the place of occurrence of the external cause: Secondary | ICD-10-CM

## 2021-06-28 DIAGNOSIS — J441 Chronic obstructive pulmonary disease with (acute) exacerbation: Secondary | ICD-10-CM | POA: Diagnosis not present

## 2021-06-28 DIAGNOSIS — K219 Gastro-esophageal reflux disease without esophagitis: Secondary | ICD-10-CM | POA: Diagnosis present

## 2021-06-28 DIAGNOSIS — I959 Hypotension, unspecified: Secondary | ICD-10-CM | POA: Diagnosis present

## 2021-06-28 DIAGNOSIS — N179 Acute kidney failure, unspecified: Principal | ICD-10-CM | POA: Diagnosis present

## 2021-06-28 DIAGNOSIS — N201 Calculus of ureter: Secondary | ICD-10-CM | POA: Diagnosis present

## 2021-06-28 DIAGNOSIS — R197 Diarrhea, unspecified: Secondary | ICD-10-CM | POA: Diagnosis present

## 2021-06-28 DIAGNOSIS — R531 Weakness: Secondary | ICD-10-CM | POA: Diagnosis not present

## 2021-06-28 DIAGNOSIS — I82811 Embolism and thrombosis of superficial veins of right lower extremities: Secondary | ICD-10-CM | POA: Diagnosis present

## 2021-06-28 DIAGNOSIS — T380X5A Adverse effect of glucocorticoids and synthetic analogues, initial encounter: Secondary | ICD-10-CM | POA: Diagnosis present

## 2021-06-28 DIAGNOSIS — E869 Volume depletion, unspecified: Secondary | ICD-10-CM | POA: Diagnosis present

## 2021-06-28 DIAGNOSIS — Z85820 Personal history of malignant melanoma of skin: Secondary | ICD-10-CM

## 2021-06-28 DIAGNOSIS — R609 Edema, unspecified: Secondary | ICD-10-CM

## 2021-06-28 DIAGNOSIS — R0602 Shortness of breath: Secondary | ICD-10-CM

## 2021-06-28 DIAGNOSIS — Z720 Tobacco use: Secondary | ICD-10-CM | POA: Diagnosis present

## 2021-06-28 DIAGNOSIS — W19XXXA Unspecified fall, initial encounter: Secondary | ICD-10-CM | POA: Diagnosis present

## 2021-06-28 DIAGNOSIS — Z79899 Other long term (current) drug therapy: Secondary | ICD-10-CM

## 2021-06-28 DIAGNOSIS — S0181XA Laceration without foreign body of other part of head, initial encounter: Secondary | ICD-10-CM | POA: Diagnosis present

## 2021-06-28 DIAGNOSIS — S80211A Abrasion, right knee, initial encounter: Secondary | ICD-10-CM | POA: Diagnosis present

## 2021-06-28 DIAGNOSIS — N1330 Unspecified hydronephrosis: Secondary | ICD-10-CM | POA: Diagnosis present

## 2021-06-28 DIAGNOSIS — J9601 Acute respiratory failure with hypoxia: Secondary | ICD-10-CM | POA: Diagnosis present

## 2021-06-28 DIAGNOSIS — W010XXA Fall on same level from slipping, tripping and stumbling without subsequent striking against object, initial encounter: Secondary | ICD-10-CM | POA: Diagnosis present

## 2021-06-28 DIAGNOSIS — S0990XA Unspecified injury of head, initial encounter: Secondary | ICD-10-CM

## 2021-06-28 DIAGNOSIS — S80212A Abrasion, left knee, initial encounter: Secondary | ICD-10-CM | POA: Diagnosis present

## 2021-06-28 DIAGNOSIS — D6489 Other specified anemias: Secondary | ICD-10-CM | POA: Diagnosis present

## 2021-06-28 DIAGNOSIS — B9620 Unspecified Escherichia coli [E. coli] as the cause of diseases classified elsewhere: Secondary | ICD-10-CM | POA: Diagnosis present

## 2021-06-28 DIAGNOSIS — J449 Chronic obstructive pulmonary disease, unspecified: Secondary | ICD-10-CM | POA: Diagnosis present

## 2021-06-28 DIAGNOSIS — N136 Pyonephrosis: Secondary | ICD-10-CM | POA: Diagnosis present

## 2021-06-28 LAB — COMPREHENSIVE METABOLIC PANEL
ALT: <5 U/L (ref 0–44)
AST: 31 U/L (ref 15–41)
Albumin: 3.2 g/dL — ABNORMAL LOW (ref 3.5–5.0)
Alkaline Phosphatase: 116 U/L (ref 38–126)
Anion gap: 9 (ref 5–15)
BUN: 35 mg/dL — ABNORMAL HIGH (ref 8–23)
CO2: 26 mmol/L (ref 22–32)
Calcium: 8.3 mg/dL — ABNORMAL LOW (ref 8.9–10.3)
Chloride: 102 mmol/L (ref 98–111)
Creatinine, Ser: 2.25 mg/dL — ABNORMAL HIGH (ref 0.61–1.24)
GFR, Estimated: 30 mL/min — ABNORMAL LOW (ref >60)
Glucose, Bld: 90 mg/dL (ref 70–99)
Potassium: 3.8 mmol/L (ref 3.5–5.1)
Sodium: 137 mmol/L (ref 135–145)
Total Bilirubin: 1.8 mg/dL — ABNORMAL HIGH (ref 0.3–1.2)
Total Protein: 6.5 g/dL (ref 6.5–8.1)

## 2021-06-28 LAB — CBC WITH DIFFERENTIAL/PLATELET
Abs Immature Granulocytes: 0.03 10*3/uL (ref 0.00–0.07)
Basophils Absolute: 0 10*3/uL (ref 0.0–0.1)
Basophils Relative: 0 %
Eosinophils Absolute: 0 10*3/uL (ref 0.0–0.5)
Eosinophils Relative: 0 %
HCT: 36.6 % — ABNORMAL LOW (ref 39.0–52.0)
Hemoglobin: 12.5 g/dL — ABNORMAL LOW (ref 13.0–17.0)
Immature Granulocytes: 0 %
Lymphocytes Relative: 2 %
Lymphs Abs: 0.1 10*3/uL — ABNORMAL LOW (ref 0.7–4.0)
MCH: 35.8 pg — ABNORMAL HIGH (ref 26.0–34.0)
MCHC: 34.2 g/dL (ref 30.0–36.0)
MCV: 104.9 fL — ABNORMAL HIGH (ref 80.0–100.0)
Monocytes Absolute: 0.1 10*3/uL (ref 0.1–1.0)
Monocytes Relative: 2 %
Neutro Abs: 6.9 10*3/uL (ref 1.7–7.7)
Neutrophils Relative %: 96 %
Platelets: 229 10*3/uL (ref 150–400)
RBC: 3.49 MIL/uL — ABNORMAL LOW (ref 4.22–5.81)
RDW: 23.4 % — ABNORMAL HIGH (ref 11.5–15.5)
WBC: 7.1 10*3/uL (ref 4.0–10.5)
nRBC: 1.7 % — ABNORMAL HIGH (ref 0.0–0.2)

## 2021-06-28 LAB — ETHANOL: Alcohol, Ethyl (B): <10 mg/dL (ref <10)

## 2021-06-28 LAB — URINALYSIS, ROUTINE W REFLEX MICROSCOPIC
Bacteria, UA: NONE SEEN
Bilirubin Urine: NEGATIVE
Glucose, UA: NEGATIVE mg/dL
Ketones, ur: 5 mg/dL — AB
Nitrite: NEGATIVE
Protein, ur: 100 mg/dL — AB
RBC / HPF: >50 RBC/hpf — ABNORMAL HIGH (ref 0–5)
Specific Gravity, Urine: 1.018 (ref 1.005–1.030)
WBC, UA: >50 WBC/hpf — ABNORMAL HIGH (ref 0–5)
pH: 5 (ref 5.0–8.0)

## 2021-06-28 LAB — D-DIMER, QUANTITATIVE: D-Dimer, Quant: 6.84 ug/mL-FEU — ABNORMAL HIGH (ref 0.00–0.50)

## 2021-06-28 LAB — BRAIN NATRIURETIC PEPTIDE: B Natriuretic Peptide: 207 pg/mL — ABNORMAL HIGH (ref 0.0–100.0)

## 2021-06-28 LAB — CK: Total CK: 156 U/L (ref 49–397)

## 2021-06-28 LAB — MAGNESIUM: Magnesium: 1.4 mg/dL — ABNORMAL LOW (ref 1.7–2.4)

## 2021-06-28 MED ORDER — LEVALBUTEROL HCL 0.63 MG/3ML IN NEBU
0.6300 mg | INHALATION_SOLUTION | Freq: Four times a day (QID) | RESPIRATORY_TRACT | Status: DC
  Administered 2021-06-28: 0.63 mg via RESPIRATORY_TRACT
  Filled 2021-06-28: qty 3

## 2021-06-28 MED ORDER — IPRATROPIUM BROMIDE 0.02 % IN SOLN
0.5000 mg | Freq: Four times a day (QID) | RESPIRATORY_TRACT | Status: DC
  Administered 2021-06-28: 0.5 mg via RESPIRATORY_TRACT
  Filled 2021-06-28: qty 2.5

## 2021-06-28 MED ORDER — IPRATROPIUM BROMIDE 0.02 % IN SOLN
0.5000 mg | Freq: Three times a day (TID) | RESPIRATORY_TRACT | Status: DC
  Administered 2021-06-29 – 2021-07-02 (×9): 0.5 mg via RESPIRATORY_TRACT
  Filled 2021-06-28 (×10): qty 2.5

## 2021-06-28 MED ORDER — ONDANSETRON HCL 4 MG PO TABS: 4.0000 mg | ORAL_TABLET | Freq: Four times a day (QID) | ORAL | Status: DC | PRN

## 2021-06-28 MED ORDER — LEVALBUTEROL HCL 0.63 MG/3ML IN NEBU
0.6300 mg | INHALATION_SOLUTION | Freq: Three times a day (TID) | RESPIRATORY_TRACT | Status: DC
  Administered 2021-06-29 – 2021-07-02 (×9): 0.63 mg via RESPIRATORY_TRACT
  Filled 2021-06-28 (×10): qty 3

## 2021-06-28 MED ORDER — LEVALBUTEROL HCL 0.63 MG/3ML IN NEBU
0.6300 mg | INHALATION_SOLUTION | Freq: Four times a day (QID) | RESPIRATORY_TRACT | Status: DC | PRN
  Administered 2021-06-29: 0.63 mg via RESPIRATORY_TRACT
  Filled 2021-06-28: qty 3

## 2021-06-28 MED ORDER — MAGNESIUM SULFATE 4 GM/100ML IV SOLN
4.0000 g | Freq: Once | INTRAVENOUS | Status: AC
  Administered 2021-06-28: 4 g via INTRAVENOUS
  Filled 2021-06-28: qty 100

## 2021-06-28 MED ORDER — SODIUM CHLORIDE 0.9 % IV BOLUS
1000.0000 mL | Freq: Once | INTRAVENOUS | Status: AC
  Administered 2021-06-28: 1000 mL via INTRAVENOUS

## 2021-06-28 MED ORDER — HEPARIN BOLUS VIA INFUSION
5500.0000 [IU] | Freq: Once | INTRAVENOUS | Status: AC
  Administered 2021-06-28: 5500 [IU] via INTRAVENOUS

## 2021-06-28 MED ORDER — ONDANSETRON HCL 4 MG/2ML IJ SOLN: 4.0000 mg | Freq: Four times a day (QID) | INTRAMUSCULAR | Status: DC | PRN

## 2021-06-28 MED ORDER — SODIUM CHLORIDE 0.9 % IV SOLN: INTRAVENOUS | Status: DC

## 2021-06-28 MED ORDER — PREDNISONE 20 MG PO TABS
40.0000 mg | ORAL_TABLET | Freq: Every day | ORAL | Status: DC
  Filled 2021-06-28: qty 2

## 2021-06-28 MED ORDER — ACETAMINOPHEN 650 MG RE SUPP: 650.0000 mg | Freq: Four times a day (QID) | RECTAL | Status: DC | PRN

## 2021-06-28 MED ORDER — HEPARIN (PORCINE) 25000 UT/250ML-% IV SOLN
1500.0000 [IU]/h | INTRAVENOUS | Status: DC
  Administered 2021-06-28 – 2021-06-29 (×2): 1500 [IU]/h via INTRAVENOUS
  Filled 2021-06-28 (×2): qty 250

## 2021-06-28 MED ORDER — ACETAMINOPHEN 325 MG PO TABS: 650.0000 mg | ORAL_TABLET | Freq: Four times a day (QID) | ORAL | Status: DC | PRN

## 2021-06-28 MED ORDER — IOHEXOL 9 MG/ML PO SOLN
ORAL | Status: AC
  Filled 2021-06-28: qty 1000

## 2021-06-28 MED ORDER — BUDESONIDE 0.25 MG/2ML IN SUSP
0.2500 mg | Freq: Two times a day (BID) | RESPIRATORY_TRACT | Status: DC
  Administered 2021-06-28 – 2021-07-02 (×7): 0.25 mg via RESPIRATORY_TRACT
  Filled 2021-06-28 (×7): qty 2

## 2021-06-28 NOTE — ED Notes (Signed)
The son was here and prompted the pt to admit he has abdominal pain and the son would like him checked for diverticulitis.  EDP and Hospitalist notified

## 2021-06-28 NOTE — Progress Notes (Signed)
   06/28/21 2034  Vitals  Temp 97.8 F (36.6 C)  Temp Source Oral  BP (!) 97/55  BP Location Left Arm  BP Method Automatic  Patient Position (if appropriate) Sitting  Pulse Rate 94  Pulse Rate Source Monitor  Resp (!) 28  MEWS COLOR  MEWS Score Color Yellow  Oxygen Therapy  SpO2 93 %  O2 Device Room Air  MEWS Score  MEWS Temp 0  MEWS Systolic 1  MEWS Pulse 0  MEWS RR 2  MEWS LOC 0  MEWS Score 3   Will recheck in 2 hours. Patient showing no distress

## 2021-06-28 NOTE — Progress Notes (Signed)
Patient being taken down in wheelchair for CT of abdomen/pelvis.

## 2021-06-28 NOTE — ED Triage Notes (Signed)
Pt pulled over at a PCP office and fell outside of his car, hitting his head; per [facility] STEMI shown on monitor but appeared to be bundle branch; call placed to [facility] and was instructed to bring pt to [facility] as pt denies chest pain

## 2021-06-28 NOTE — ED Notes (Signed)
EDP at bedside for MSE.  

## 2021-06-28 NOTE — Progress Notes (Signed)
Patient back from CT.

## 2021-06-28 NOTE — Progress Notes (Signed)
[facility] for heparin Indication: pulmonary embolus  No Known Allergies [phone] Patient Measurements: Height: 5\' 9"  (175.3 cm) Weight: 113.4 kg (250 lb) IBW/kg (Calculated) : 70.7 Heparin Dosing Weight: 95kg  Vital Signs: Temp: 99 F (37.2 C) (07/07 1143) Temp Source: Oral (07/07 1143) BP: 113/71 (07/07 1700) Pulse Rate: 117 (07/07 1700)  Labs: Recent Labs    06/28/21 1247  HGB 12.5*  HCT 36.6*  PLT 229  CREATININE 2.25*  CKTOTAL 156    Estimated Creatinine Clearance: 36.3 mL/min (A) (by C-G formula based on SCr of 2.25 mg/dL (H)).   Medical History: Past Medical History:  Diagnosis Date   Atypical mole 09/09/2014   malignant spindle cell-right temple   Basal cell carcinoma 10/16/2017   top left sholder-txpbx   BCC (basal cell carcinoma) 04/21/2017   right side-cx67fu   BCC (basal cell carcinoma) 08/30/2014   back-cx30fu   BCC (basal cell carcinoma) 07/05/1994   left temple-excision   BCC (basal cell carcinoma) 03/20/2009   right neck-excision   BCC (basal cell carcinoma) 03/20/2009   mid chest superior-cx77fu   BCC (basal cell carcinoma) 03/20/2009   mid chest inferior-cx61fu   BCC (basal cell carcinoma) 10/06/2012   right chest-cx58fu   Colon polyps    GERD (gastroesophageal reflux disease)    H/O renal calculi    Hypertension    Melanoma ([place]) 08/04/2012   L lateral torso/left flank-tx Dr.Leshin   SCC (squamous cell carcinoma) 10/16/2017   right top scalp-mohs   SCC (squamous cell carcinoma) 10/16/2017   mid abdomen-txpbx   SCC (squamous cell carcinoma) 04/21/2018   lower lip-mohs   SCC (squamous cell carcinoma) 07/27/2018   right post scalp,left jawline,left temple, right temple,right sideburn   SCC (squamous cell carcinoma) 12/28/2018   medial scalp, left post scalp   SCC (squamous cell carcinoma) 02/16/2020   left ear rim, left inner ear,left sideburn superior,left sideburn inferior,tip of nose    SCC (squamous cell carcinoma) 01/10/2016   right forehead-mohs, right inner wrist-cx25fu   SCC (squamous cell carcinoma) 03/21/2016   left abdomen   SCC (squamous cell carcinoma) 09/24/2016   left cheek superior,inferior left cheek,inferior left cheek medial,left sideburn,right temple superior,right temple inferior   SCC (squamous cell carcinoma) 12/12/2016   left inner cheek-txpbx   SCC (squamous cell carcinoma) 04/21/2017   right temple superior-mohs,right inner cheek, left cheek superior-mohs   SCC (squamous cell carcinoma) 01/12/2013   right cheek-cx65fu,right hand-clear   SCC (squamous cell carcinoma) 03/18/2013   right jawline-txpbx   SCC (squamous cell carcinoma) 11/23/2013   left inner cheek, right scalp, right forehead,right cheek-cx37fu   SCC (squamous cell carcinoma) 01/06/2014   right temple-mohs   SCC (squamous cell carcinoma) 08/30/2014   back-cx44fu, left cheek-cx35u-excision   SCC (squamous cell carcinoma) 01/25/2015   right temple,right temple inf, left cheek-cx64fu   SCC (squamous cell carcinoma) 09/09/2015   post scalp,right forhead lateral, right temple   SCC (squamous cell carcinoma) 03/20/2009   left temple   SCC (squamous cell carcinoma) 01/14/2011   left hairline, bridge of nose   SCC (squamous cell carcinoma) 10/07/2011   under right inner eye, left cheek near temple, right cheek   SCC (squamous cell carcinoma) 10/12/2015   frontal scalp   SCCA (squamous cell carcinoma) of skin 06/27/2020   Left Hand Posterior (well diff)   SCCA (squamous cell carcinoma) of skin 06/27/2020   Right Hand Posterior (well diff)   SCCA (squamous cell carcinoma) of skin 06/27/2020  Scalp (well diff)   SCCA (squamous cell carcinoma) of skin 06/27/2020   Right Ear Rim (well diff)   SCCA (squamous cell carcinoma) of skin 09/14/2020   well diff-mid forehead   SCCA (squamous cell carcinoma) of skin 09/14/2020   in situ-mid parietal scalp-anterior   SCCA (squamous cell  carcinoma) of skin 09/14/2020   mid parietal scalp-posterior   Skin cancer    Squamous cell carcinoma of skin 10/16/2017   left top scalp-txpbx    Assessment: 47 yoM admitted after fall with SOB, concern for PE. Cannot perform CT chest or VQ scan at this time so pharmacy asked to start heparin empirically. CT head negative, no AC PTA, CBC wnl.  Goal of Therapy:  Heparin level 0.3-0.7 units/ml Monitor platelets by anticoagulation protocol: Yes   Plan:  -Heparin 5500 units x1 -Heparin 1500 units/hr -Check 8-hr heparin level -Monitor heparin level, CBC, S/Sx bleeding daily  Arrie Senate, PharmD, BCPS, [facility] Clinical Pharmacist Please check AMION for all [facility] numbers 06/28/2021

## 2021-06-28 NOTE — ED Notes (Signed)
Patient transported to CT 

## 2021-06-28 NOTE — H&P (Signed)
History and Physical    CESAR ROGERSON YKZ:[id number] DOB: 1946/07/12 DOA: 06/28/2021  PCP: [name] [facility]  Patient coming from: Home  I have personally briefly reviewed patient's old medical records in [place]  Chief Complaint: Fall  HPI: PERCIVAL GLASHEEN is a 75 y.o. male with medical history significant of hypertension, tobacco use, was reportedly at the [facility] today and had squatted down to change his license plate.  Upon standing, patient reports that he lost his balance and fell.  He initially fell on his knees causing abrasions to his knees.  When he tried to get up again, he fell again and hit his head.  He did not lose consciousness.  He feels generally weak.  He has felt this way for the past few days.  Reported that his p.o. intake has been poor the past few days.  He is also noted decreased urine output and dark urine for the past few days.  His son reports that patient did complain of abdominal pain along his lower abdomen for the past few days with decreased p.o. intake.  His son is concerned that he may have diverticulitis since his symptoms did start after eating cucumbers and seedlings watermelon.  Patient reports having chronic diarrhea, but this is slowed down over the past few days.  He has not had any vomiting.  He reports chronic shortness of breath, but worse over the past few days.  He has not had any fever or cough.  ED Course: He was evaluated in the emergency room where CT head was found to be unremarkable.  Chest x-ray did not show any evidence of pneumonia.  Labs did show elevated creatinine, unclear baseline.  Urinalysis showed turbid, concentrated urine without any bacteria.  Otherwise labs were relatively unrevealing.  Review of Systems:  Review of Systems  Constitutional:  Negative for fever.  HENT:  Negative for congestion and sore throat.   Eyes:  Negative for blurred vision and double vision.  Respiratory:  Positive for shortness of breath and  wheezing. Negative for cough.   Cardiovascular:  Positive for leg swelling. Negative for chest pain.  Gastrointestinal:  Positive for abdominal pain and diarrhea. Negative for blood in stool and vomiting.  Genitourinary:  Positive for flank pain.  Musculoskeletal:  Positive for falls.  Neurological:  Positive for dizziness and weakness.      Past Medical History:  Diagnosis Date   Atypical mole 09/09/2014   malignant spindle cell-right temple   Basal cell carcinoma 10/16/2017   top left sholder-txpbx   BCC (basal cell carcinoma) 04/21/2017   right side-cx37fu   BCC (basal cell carcinoma) 08/30/2014   back-cx23fu   BCC (basal cell carcinoma) 07/05/1994   left temple-excision   BCC (basal cell carcinoma) 03/20/2009   right neck-excision   BCC (basal cell carcinoma) 03/20/2009   mid chest superior-cx76fu   BCC (basal cell carcinoma) 03/20/2009   mid chest inferior-cx60fu   BCC (basal cell carcinoma) 10/06/2012   right chest-cx19fu   Colon polyps    GERD (gastroesophageal reflux disease)    H/O renal calculi    Hypertension    Melanoma ([place]) 08/04/2012   L lateral torso/left flank-tx Dr.Leshin   SCC (squamous cell carcinoma) 10/16/2017   right top scalp-mohs   SCC (squamous cell carcinoma) 10/16/2017   mid abdomen-txpbx   SCC (squamous cell carcinoma) 04/21/2018   lower lip-mohs   SCC (squamous cell carcinoma) 07/27/2018   right post scalp,left jawline,left temple, right temple,right  sideburn   SCC (squamous cell carcinoma) 12/28/2018   medial scalp, left post scalp   SCC (squamous cell carcinoma) 02/16/2020   left ear rim, left inner ear,left sideburn superior,left sideburn inferior,tip of nose   SCC (squamous cell carcinoma) 01/10/2016   right forehead-mohs, right inner wrist-cx51fu   SCC (squamous cell carcinoma) 03/21/2016   left abdomen   SCC (squamous cell carcinoma) 09/24/2016   left cheek superior,inferior left cheek,inferior left cheek medial,left  sideburn,right temple superior,right temple inferior   SCC (squamous cell carcinoma) 12/12/2016   left inner cheek-txpbx   SCC (squamous cell carcinoma) 04/21/2017   right temple superior-mohs,right inner cheek, left cheek superior-mohs   SCC (squamous cell carcinoma) 01/12/2013   right cheek-cx3fu,right hand-clear   SCC (squamous cell carcinoma) 03/18/2013   right jawline-txpbx   SCC (squamous cell carcinoma) 11/23/2013   left inner cheek, right scalp, right forehead,right cheek-cx47fu   SCC (squamous cell carcinoma) 01/06/2014   right temple-mohs   SCC (squamous cell carcinoma) 08/30/2014   back-cx42fu, left cheek-cx35u-excision   SCC (squamous cell carcinoma) 01/25/2015   right temple,right temple inf, left cheek-cx24fu   SCC (squamous cell carcinoma) 09/09/2015   post scalp,right forhead lateral, right temple   SCC (squamous cell carcinoma) 03/20/2009   left temple   SCC (squamous cell carcinoma) 01/14/2011   left hairline, bridge of nose   SCC (squamous cell carcinoma) 10/07/2011   under right inner eye, left cheek near temple, right cheek   SCC (squamous cell carcinoma) 10/12/2015   frontal scalp   SCCA (squamous cell carcinoma) of skin 06/27/2020   Left Hand Posterior (well diff)   SCCA (squamous cell carcinoma) of skin 06/27/2020   Right Hand Posterior (well diff)   SCCA (squamous cell carcinoma) of skin 06/27/2020   Scalp (well diff)   SCCA (squamous cell carcinoma) of skin 06/27/2020   Right Ear Rim (well diff)   SCCA (squamous cell carcinoma) of skin 09/14/2020   well diff-mid forehead   SCCA (squamous cell carcinoma) of skin 09/14/2020   in situ-mid parietal scalp-anterior   SCCA (squamous cell carcinoma) of skin 09/14/2020   mid parietal scalp-posterior   Skin cancer    Squamous cell carcinoma of skin 10/16/2017   left top scalp-txpbx    Past Surgical History:  Procedure Laterality Date   BILIARY STENT PLACEMENT N/A 05/20/2013   Procedure: BILIARY STENT  PLACEMENT;  Surgeon: Daneil Dolin, MD;  Location: [facility];  Service: Gastroenterology;  Laterality: N/A;   CHOLECYSTECTOMY N/A 05/24/931   complicated by cystic duct stump leak and gb fossa abscess. Procedure: LAPAROSCOPIC CHOLECYSTECTOMY;  Surgeon: Jamesetta So, MD;  Location: [facility];  Service: General;  Laterality: N/A;   COLONOSCOPY  2004   Dr. Laural Golden: few scattered diverticula at sigmoid and transverse colon, polyps. path unknown.    COLONOSCOPY  July 2004   Dr. Gala Romney. Few scattered diverticula sigmoid and transverse colon, 2 small rectal polyps (path unavailable)   COLONOSCOPY N/A 09/21/2013   Procedure: COLONOSCOPY;  Surgeon: Daneil Dolin, MD;  Location: [facility];  Service: Endoscopy;  Laterality: N/A;  10:30 AM   CYSTOSCOPY     ERCP N/A 05/20/2013   RMR: cystic duct stump leak s/p cholecystectomy. +choledocholithiasis, s/p sphinterotomy, stent placement, stone extraction   ERCP N/A 07/22/2013   RMR: S/P removal of biliary stent along with removal of common duct stones as described above, status post sphincterotomy balloon dilation and balloon dredging of the biliary tree Previously noted biliary leak sealed/Duodenal diverticulum  ESOPHAGOGASTRODUODENOSCOPY N/A 06/08/2013   [place] esophageal erosions in the distal one half of the esophagus. 2 cm segment of markedly inflamed and swollen duodenal mucosa at the junction of the bulb and second portion. Significant encroachment on the lumen causing GOO. Bx: benign  Surgeon: Daneil Dolin, MD;  Location: [facility];  Service: Endoscopy;  Laterality: N/A;  WITH PHENERGAN 12.5 mg IV on call    EXTRACORPOREAL SHOCK WAVE LITHOTRIPSY     EYE SURGERY     EYE SURGERY Left 35 years ago   KIDNEY STONE SURGERY     MELANOMA EXCISION Left 08/13   [facility]   REMOVAL OF STONES N/A 07/22/2013   Procedure: REMOVAL OF common bile duct STONES;  Surgeon: Daneil Dolin, MD;  Location: [facility];  Service: Endoscopy;  Laterality: N/A;   SPHINCTEROTOMY N/A  05/20/2013   Procedure: SPHINCTEROTOMY;  Surgeon: Daneil Dolin, MD;  Location: [facility];  Service: Gastroenterology;  Laterality: N/A;   SPHINCTEROTOMY N/A 07/22/2013   Procedure: SPHINCTEROTOMY with dilation;  Surgeon: Daneil Dolin, MD;  Location: [facility];  Service: Endoscopy;  Laterality: N/A;   STENT REMOVAL N/A 07/22/2013   Procedure: Biliary STENT REMOVAL;  Surgeon: Daneil Dolin, MD;  Location: [facility];  Service: Endoscopy;  Laterality: N/A;    Social History:  reports that he has been smoking. He has a 25.00 pack-year smoking history. He has never used smokeless tobacco. He reports that he does not drink alcohol and does not use drugs.  No Known Allergies  Family History  Problem Relation Age of Onset   Cancer Father    Cancer Brother    Colon cancer Neg Hx      Prior to Admission medications   Medication Sig Start Date End Date Taking? Authorizing Provider  hydrochlorothiazide (HYDRODIURIL) 25 MG tablet  01/26/20  Yes [provider]  losartan (COZAAR) 100 MG tablet  01/26/20  Yes [provider]  tobramycin-dexamethasone ([place]) ophthalmic solution  06/23/20  Yes [provider]  VITAMIN D PO Take 1 tablet by mouth daily.   Yes [provider]    Physical Exam: Vitals:   06/28/21 1800 06/28/21 1830 06/28/21 1900 06/28/21 2021  BP: (!) 101/56 100/68 110/66   Pulse: (!) 107 (!) 105 (!) 101   Resp: (!) 21 (!) 26 (!) 28   Temp:      TempSrc:      SpO2: 93% 95% 92% 91%  Weight:      Height:        Constitutional: NAD, calm, comfortable Eyes: PERRL, lids and conjunctivae normal ENMT: Mucous membranes are moist. Posterior pharynx clear of any exudate or lesions.Normal dentition.  Neck: normal, supple, no masses, no thyromegaly Respiratory: clear to auscultation bilaterally, no wheezing, no crackles.  Increased respiratory effort. No accessory muscle use.  Cardiovascular: Regular rhythm, tachycardic, no murmurs / rubs / gallops.  1+  extremity edema. 2+ pedal pulses. No carotid bruits.  Abdomen: no tenderness, no masses palpated. No hepatosplenomegaly. Bowel sounds positive.  Musculoskeletal: no clubbing / cyanosis. No joint deformity upper and lower extremities. Good ROM, no contractures. Normal muscle tone.  Skin: Abrasions of the knees bilaterally and forehead Neurologic: CN 2-12 grossly intact. Sensation intact, DTR normal. Strength 5/5 in all 4.  Psychiatric: Normal judgment and insight. Alert and oriented x 3. Normal mood.    Labs on Admission: I have personally reviewed following labs and imaging studies  CBC: Recent Labs  Lab 06/28/21 1247  WBC  7.1  NEUTROABS 6.9  HGB 12.5*  HCT 36.6*  MCV 104.9*  PLT 295   Basic Metabolic Panel: Recent Labs  Lab 06/28/21 1247  NA 137  K 3.8  CL 102  CO2 26  GLUCOSE 90  BUN 35*  CREATININE 2.25*  CALCIUM 8.3*  MG 1.4*   GFR: Estimated Creatinine Clearance: 36.3 mL/min (A) (by C-G formula based on SCr of 2.25 mg/dL (H)). Liver Function Tests: Recent Labs  Lab 06/28/21 1247  AST 31  ALT <5  ALKPHOS 116  BILITOT 1.8*  PROT 6.5  ALBUMIN 3.2*   No results for input(s): LIPASE, AMYLASE in the last 168 hours. No results for input(s): AMMONIA in the last 168 hours. Coagulation Profile: No results for input(s): INR, PROTIME in the last 168 hours. Cardiac Enzymes: Recent Labs  Lab 06/28/21 1247  CKTOTAL 156   BNP (last 3 results) No results for input(s): PROBNP in the last 8760 hours. HbA1C: No results for input(s): HGBA1C in the last 72 hours. CBG: No results for input(s): GLUCAP in the last 168 hours. Lipid Profile: No results for input(s): CHOL, HDL, LDLCALC, TRIG, CHOLHDL, LDLDIRECT in the last 72 hours. Thyroid Function Tests: No results for input(s): TSH, T4TOTAL, FREET4, T3FREE, THYROIDAB in the last 72 hours. Anemia Panel: No results for input(s): VITAMINB12, FOLATE, FERRITIN, TIBC, IRON, RETICCTPCT in the last 72 hours. Urine  analysis:    Component Value Date/Time   COLORURINE YELLOW 06/28/2021 1233   APPEARANCEUR TURBID (A) 06/28/2021 1233   LABSPEC 1.018 06/28/2021 1233   PHURINE 5.0 06/28/2021 1233   GLUCOSEU NEGATIVE 06/28/2021 1233   HGBUR MODERATE (A) 06/28/2021 1233   BILIRUBINUR NEGATIVE 06/28/2021 1233   KETONESUR 5 (A) 06/28/2021 1233   PROTEINUR 100 (A) 06/28/2021 1233   NITRITE NEGATIVE 06/28/2021 1233   LEUKOCYTESUR LARGE (A) 06/28/2021 1233    Radiological Exams on Admission: CT Head Wo Contrast  Result Date: 06/28/2021 CLINICAL DATA:  Head trauma, minor. Additional history provided: Fall today, laceration to left forehead. EXAM: CT HEAD WITHOUT CONTRAST TECHNIQUE: Contiguous axial images were obtained from the base of the skull through the vertex without intravenous contrast. COMPARISON:  No pertinent prior exams available for comparison. FINDINGS: Brain: Mild generalized cerebral atrophy. There is no acute intracranial hemorrhage. No demarcated cortical infarct. No extra-axial fluid collection. No evidence of an intracranial mass. No midline shift. Vascular: No hyperdense vessel.  Atherosclerotic calcifications. Skull: Normal. Negative for fracture or focal lesion. Sinuses/Orbits: Visualized orbits show no acute finding. Trace bilateral maxillary sinus mucosal thickening at the imaged levels. Other: Bilateral middle ear/mastoid effusions. IMPRESSION: No evidence of acute intracranial abnormality. Mild generalized cerebral atrophy. Bilateral middle ear/mastoid effusions. Electronically Signed   By: Kellie Simmering DO   On: 06/28/2021 14:23   DG Chest Port 1 View  Result Date: 06/28/2021 CLINICAL DATA:  Shortness of breath. EXAM: PORTABLE CHEST 1 VIEW COMPARISON:  June 11, 2013. FINDINGS: Stable cardiomegaly. Left lung is clear. Minimal right basilar subsegmental atelectasis. The visualized skeletal structures are unremarkable. IMPRESSION: Minimal right basilar subsegmental atelectasis. Electronically  Signed   By: Marijo Conception M.D.   On: 06/28/2021 13:10    EKG: Independently reviewed.  Sinus tachycardia with right bundle branch block and prolonged QT  Assessment/Plan Active Problems:   AKI (acute kidney injury) ([place])   HTN (hypertension)   Tobacco abuse   Fall   Generalized weakness     Acute kidney injury -Suspect is related to decreased p.o. intake in the setting  of lisinopril/hydrochlorothiazide -We will start on IV hydration and monitor urine output -Hold ACE inhibitor/hydrochlorothiazide  Abdominal pain with concerns for diverticulitis -Check CT abdomen  Hypertension -Blood pressures running low, likely related to volume depletion -Continue to hold antihypertensives  Probable COPD -Long history of tobacco use -Does complain of shortness of breath -We will start on bronchodilators and steroids  Tachycardia -Likely related to volume depletion -With ongoing issues with shortness of breath, tachycardia, sudden onset of symptoms, other concerns for underlying VTE -Unfortunately, patient cannot undergo CT angiogram due to elevated creatinine -At this point, he appears to be unlikely to be able to lay flat for VQ scan -We will start empirically on heparin for now -Check echocardiogram -Check BNP and D-dimer  DVT prophylaxis: Heparin Code Status: DNR, confirmed with patient Family Communication: Updated patient's son Lissa Merlin [phone] Disposition Plan: Discharged home once renal function has improved Consults called:   Admission status: Observation, telemetry  Kathie Dike MD [facility]   If 7PM-7AM, please contact night-coverage [email or web address]   06/28/2021, 8:30 PM

## 2021-06-28 NOTE — Progress Notes (Signed)
   06/28/21 2231  Vitals  Temp 98.2 F (36.8 C)  Temp Source Oral  BP (!) 99/54  BP Location Left Arm  BP Method Automatic  Patient Position (if appropriate) Sitting  Pulse Rate 92  Pulse Rate Source Monitor  Resp (!) 24  MEWS COLOR  MEWS Score Color Yellow  Oxygen Therapy  SpO2 91 %  O2 Device Room Air  MEWS Score  MEWS Temp 0  MEWS Systolic 1  MEWS Pulse 0  MEWS RR 1  MEWS LOC 0  MEWS Score 2   Will monitor again in 2 hours. Charge nurse notified.

## 2021-06-28 NOTE — ED Provider Notes (Signed)
[facility] Provider Note  CSN: [id number] Arrival date & time: 06/28/21 1140    History Chief Complaint  Patient presents with   Sergio Schroeder is a 75 y.o. male with history of multiple skin cancers but no significant medical history reports he was bending over to change his license plate at the [facility] today when he lost his balance and fell over, landing on his knees and hitting his head. He fell over again when he was trying to get up and so EMS was called by a bystander and brought to the ED for evaluation. He denies any LOC. No CP. He has chronic SOB he attributes to smoking but he does not wear oxygen or have a formal diagnosis of COPD. He denies any nausea, vomiting or diarrhea. He admits to poor PO intake recently because 'it has been so hot'. Only complaining of mild knee pain where he sustained abrasions. EMS reports he was tachycardic en route. Their EKG was concerning for STEMI but given no chest pain he was not taken to [facility].    Past Medical History:  Diagnosis Date   Atypical mole 09/09/2014   malignant spindle cell-right temple   Basal cell carcinoma 10/16/2017   top left sholder-txpbx   BCC (basal cell carcinoma) 04/21/2017   right side-cx74fu   BCC (basal cell carcinoma) 08/30/2014   back-cx84fu   BCC (basal cell carcinoma) 07/05/1994   left temple-excision   BCC (basal cell carcinoma) 03/20/2009   right neck-excision   BCC (basal cell carcinoma) 03/20/2009   mid chest superior-cx70fu   BCC (basal cell carcinoma) 03/20/2009   mid chest inferior-cx54fu   BCC (basal cell carcinoma) 10/06/2012   right chest-cx55fu   Colon polyps    GERD (gastroesophageal reflux disease)    H/O renal calculi    Hypertension    Melanoma ([place]) 08/04/2012   L lateral torso/left flank-tx Dr.Leshin   SCC (squamous cell carcinoma) 10/16/2017   right top scalp-mohs   SCC (squamous cell carcinoma) 10/16/2017   mid abdomen-txpbx   SCC (squamous cell  carcinoma) 04/21/2018   lower lip-mohs   SCC (squamous cell carcinoma) 07/27/2018   right post scalp,left jawline,left temple, right temple,right sideburn   SCC (squamous cell carcinoma) 12/28/2018   medial scalp, left post scalp   SCC (squamous cell carcinoma) 02/16/2020   left ear rim, left inner ear,left sideburn superior,left sideburn inferior,tip of nose   SCC (squamous cell carcinoma) 01/10/2016   right forehead-mohs, right inner wrist-cx80fu   SCC (squamous cell carcinoma) 03/21/2016   left abdomen   SCC (squamous cell carcinoma) 09/24/2016   left cheek superior,inferior left cheek,inferior left cheek medial,left sideburn,right temple superior,right temple inferior   SCC (squamous cell carcinoma) 12/12/2016   left inner cheek-txpbx   SCC (squamous cell carcinoma) 04/21/2017   right temple superior-mohs,right inner cheek, left cheek superior-mohs   SCC (squamous cell carcinoma) 01/12/2013   right cheek-cx61fu,right hand-clear   SCC (squamous cell carcinoma) 03/18/2013   right jawline-txpbx   SCC (squamous cell carcinoma) 11/23/2013   left inner cheek, right scalp, right forehead,right cheek-cx35fu   SCC (squamous cell carcinoma) 01/06/2014   right temple-mohs   SCC (squamous cell carcinoma) 08/30/2014   back-cx71fu, left cheek-cx35u-excision   SCC (squamous cell carcinoma) 01/25/2015   right temple,right temple inf, left cheek-cx73fu   SCC (squamous cell carcinoma) 09/09/2015   post scalp,right forhead lateral, right temple   SCC (squamous cell carcinoma) 03/20/2009   left temple  SCC (squamous cell carcinoma) 01/14/2011   left hairline, bridge of nose   SCC (squamous cell carcinoma) 10/07/2011   under right inner eye, left cheek near temple, right cheek   SCC (squamous cell carcinoma) 10/12/2015   frontal scalp   SCCA (squamous cell carcinoma) of skin 06/27/2020   Left Hand Posterior (well diff)   SCCA (squamous cell carcinoma) of skin 06/27/2020   Right Hand  Posterior (well diff)   SCCA (squamous cell carcinoma) of skin 06/27/2020   Scalp (well diff)   SCCA (squamous cell carcinoma) of skin 06/27/2020   Right Ear Rim (well diff)   SCCA (squamous cell carcinoma) of skin 09/14/2020   well diff-mid forehead   SCCA (squamous cell carcinoma) of skin 09/14/2020   in situ-mid parietal scalp-anterior   SCCA (squamous cell carcinoma) of skin 09/14/2020   mid parietal scalp-posterior   Skin cancer    Squamous cell carcinoma of skin 10/16/2017   left top scalp-txpbx    Past Surgical History:  Procedure Laterality Date   BILIARY STENT PLACEMENT N/A 05/20/2013   Procedure: BILIARY STENT PLACEMENT;  Surgeon: Daneil Dolin, MD;  Location: [facility];  Service: Gastroenterology;  Laterality: N/A;   CHOLECYSTECTOMY N/A 06/28/2375   complicated by cystic duct stump leak and gb fossa abscess. Procedure: LAPAROSCOPIC CHOLECYSTECTOMY;  Surgeon: Jamesetta So, MD;  Location: [facility];  Service: General;  Laterality: N/A;   COLONOSCOPY  2004   Dr. Laural Golden: few scattered diverticula at sigmoid and transverse colon, polyps. path unknown.    COLONOSCOPY  July 2004   Dr. Gala Romney. Few scattered diverticula sigmoid and transverse colon, 2 small rectal polyps (path unavailable)   COLONOSCOPY N/A 09/21/2013   Procedure: COLONOSCOPY;  Surgeon: Daneil Dolin, MD;  Location: [facility];  Service: Endoscopy;  Laterality: N/A;  10:30 AM   CYSTOSCOPY     ERCP N/A 05/20/2013   RMR: cystic duct stump leak s/p cholecystectomy. +choledocholithiasis, s/p sphinterotomy, stent placement, stone extraction   ERCP N/A 07/22/2013   RMR: S/P removal of biliary stent along with removal of common duct stones as described above, status post sphincterotomy balloon dilation and balloon dredging of the biliary tree Previously noted biliary leak sealed/Duodenal diverticulum   ESOPHAGOGASTRODUODENOSCOPY N/A 06/08/2013   [place] esophageal erosions in the distal one half of the esophagus. 2 cm segment  of markedly inflamed and swollen duodenal mucosa at the junction of the bulb and second portion. Significant encroachment on the lumen causing GOO. Bx: benign  Surgeon: Daneil Dolin, MD;  Location: [facility];  Service: Endoscopy;  Laterality: N/A;  WITH PHENERGAN 12.5 mg IV on call    EXTRACORPOREAL SHOCK WAVE LITHOTRIPSY     EYE SURGERY     EYE SURGERY Left 35 years ago   KIDNEY STONE SURGERY     MELANOMA EXCISION Left 08/13   [facility]   REMOVAL OF STONES N/A 07/22/2013   Procedure: REMOVAL OF common bile duct STONES;  Surgeon: Daneil Dolin, MD;  Location: [facility];  Service: Endoscopy;  Laterality: N/A;   SPHINCTEROTOMY N/A 05/20/2013   Procedure: SPHINCTEROTOMY;  Surgeon: Daneil Dolin, MD;  Location: [facility];  Service: Gastroenterology;  Laterality: N/A;   SPHINCTEROTOMY N/A 07/22/2013   Procedure: SPHINCTEROTOMY with dilation;  Surgeon: Daneil Dolin, MD;  Location: [facility];  Service: Endoscopy;  Laterality: N/A;   STENT REMOVAL N/A 07/22/2013   Procedure: Biliary STENT REMOVAL;  Surgeon: Daneil Dolin, MD;  Location: [facility];  Service:  Endoscopy;  Laterality: N/A;    Family History  Problem Relation Age of Onset   Cancer Father    Cancer Brother    Colon cancer Neg Hx     Social History   Tobacco Use   Smoking status: Every Day    Packs/day: 0.50    Years: 50.00    Pack years: 25.00    Types: Cigarettes   Smokeless tobacco: Never  Vaping Use   Vaping Use: Former  Substance Use Topics   Alcohol use: No    Alcohol/week: 0.0 standard drinks   Drug use: No    Types: Marijuana    Comment: none since April     Home Medications Prior to Admission medications   Medication Sig Start Date End Date Taking? Authorizing Provider  erythromycin ophthalmic ointment  01/26/20   [provider]  hydrochlorothiazide (HYDRODIURIL) 25 MG tablet  01/26/20   [provider]  losartan (COZAAR) 100 MG tablet  01/26/20   [provider]   losartan-hydrochlorothiazide (HYZAAR) 100-25 MG per tablet Take 1 tablet by mouth daily.    [provider]  mupirocin ointment (BACTROBAN) 2 %  09/22/19   [provider]  tamsulosin (FLOMAX) 0.4 MG CAPS capsule  01/26/20   [provider]  tobramycin-dexamethasone [facility]) ophthalmic solution  06/23/20   [provider]     Allergies    Patient has no known allergies.   Review of Systems   Review of Systems A comprehensive review of systems was completed and negative except as noted in HPI.    Physical Exam BP (!) 90/43   Pulse (!) 120   Temp 99 F (37.2 C) (Oral)   Resp 20   Ht 5\' 9"  (1.753 m)   Wt 113.4 kg   SpO2 95%   BMI 36.92 kg/m   Physical Exam Vitals and nursing note reviewed.  Constitutional:      Appearance: Normal appearance.  HENT:     Head: Normocephalic.     Comments: Abrasion forehead    Nose: Nose normal.     Mouth/Throat:     Mouth: Mucous membranes are moist.  Eyes:     Extraocular Movements: Extraocular movements intact.     Conjunctiva/sclera: Conjunctivae normal.  Cardiovascular:     Rate and Rhythm: Tachycardia present.  Pulmonary:     Effort: Pulmonary effort is normal.     Breath sounds: Normal breath sounds.     Comments: Decreased breath sounds without frank wheezing Abdominal:     Palpations: Abdomen is soft.     Tenderness: There is no abdominal tenderness. There is no guarding.     Comments: protuberent  Musculoskeletal:        General: No swelling. Normal range of motion.     Cervical back: Neck supple.  Skin:    General: Skin is warm and dry.     Comments: Abrasions to both knees  Neurological:     General: No focal deficit present.     Mental Status: He is alert.     Cranial Nerves: No cranial nerve deficit.     Sensory: No sensory deficit.     Motor: No weakness.  Psychiatric:        Mood and Affect: Mood normal.     ED Results / Procedures / Treatments   Labs (all labs ordered  are listed, but only abnormal results are displayed) Labs Reviewed  COMPREHENSIVE METABOLIC PANEL - Abnormal; Notable for the following components:  Result Value   BUN 35 (*)    Creatinine, Ser 2.25 (*)    Calcium 8.3 (*)    Albumin 3.2 (*)    Total Bilirubin 1.8 (*)    GFR, Estimated 30 (*)    All other components within normal limits  CBC WITH DIFFERENTIAL/PLATELET - Abnormal; Notable for the following components:   RBC 3.49 (*)    Hemoglobin 12.5 (*)    HCT 36.6 (*)    MCV 104.9 (*)    MCH 35.8 (*)    RDW 23.4 (*)    nRBC 1.7 (*)    Lymphs Abs 0.1 (*)    All other components within normal limits  ETHANOL  CK  URINALYSIS, ROUTINE W REFLEX MICROSCOPIC  MAGNESIUM    EKG EKG Interpretation  Date/Time:  Thursday June 28 2021 11:47:18 EDT Ventricular Rate:  123 PR Interval:  161 QRS Duration: 147 QT Interval:  368 QTC Calculation: 527 R Axis:   -76 Text Interpretation: Ectopic atrial tachycardia, unifocal Multiform ventricular premature complexes Right bundle branch block LVH with IVCD and secondary repol abnrm Anterolateral infarct, age indeterminate Prolonged QT interval Since last tracing Rate faster Confirmed by Calvert Cantor [phone]) on 06/28/2021 12:02:04 PM  Radiology CT Head Wo Contrast  Result Date: 06/28/2021 CLINICAL DATA:  Head trauma, minor. Additional history provided: Fall today, laceration to left forehead. EXAM: CT HEAD WITHOUT CONTRAST TECHNIQUE: Contiguous axial images were obtained from the base of the skull through the vertex without intravenous contrast. COMPARISON:  No pertinent prior exams available for comparison. FINDINGS: Brain: Mild generalized cerebral atrophy. There is no acute intracranial hemorrhage. No demarcated cortical infarct. No extra-axial fluid collection. No evidence of an intracranial mass. No midline shift. Vascular: No hyperdense vessel.  Atherosclerotic calcifications. Skull: Normal. Negative for fracture or focal lesion.  Sinuses/Orbits: Visualized orbits show no acute finding. Trace bilateral maxillary sinus mucosal thickening at the imaged levels. Other: Bilateral middle ear/mastoid effusions. IMPRESSION: No evidence of acute intracranial abnormality. Mild generalized cerebral atrophy. Bilateral middle ear/mastoid effusions. Electronically Signed   By: Kellie Simmering DO   On: 06/28/2021 14:23   DG Chest Port 1 View  Result Date: 06/28/2021 CLINICAL DATA:  Shortness of breath. EXAM: PORTABLE CHEST 1 VIEW COMPARISON:  June 11, 2013. FINDINGS: Stable cardiomegaly. Left lung is clear. Minimal right basilar subsegmental atelectasis. The visualized skeletal structures are unremarkable. IMPRESSION: Minimal right basilar subsegmental atelectasis. Electronically Signed   By: Marijo Conception M.D.   On: 06/28/2021 13:10    Procedures Procedures  Medications Ordered in the ED Medications  sodium chloride 0.9 % bolus 1,000 mL (has no administration in time range)  0.9 %  sodium chloride infusion (has no administration in time range)     MDM Rules/Calculators/A&P MDM Patient with falls, he attributes to losing his balance, denies LOC, but noted to be tachycardic, borderline BP in the ED. Will check labs, give IVF and reassess.   ED Course  I have reviewed the triage vital signs and the nursing notes.  Pertinent labs & imaging results that were available during my care of the patient were reviewed by me and considered in my medical decision making (see chart for details).  Clinical Course as of 06/28/21 1431  Thu Jun 28, 2021  1322 EtOH neg. CBC with mild anemia, no recent for comparison.  [CS]  4403 CXR without acute process.  [CS]  1330 CMP with elevated Cr, last labs were 7 years ago but was normal then. Presumed to  be AKI. Getting IVF now. Will discuss admission with patient.  [CS]    Clinical Course User Index [CS] Truddie Hidden, MD    Final Clinical Impression(s) / ED Diagnoses Final diagnoses:  AKI  (acute kidney injury) ([place])  Fall, initial encounter  Injury of head, initial encounter    Rx / DC Orders ED Discharge Orders     None        Truddie Hidden, MD 06/28/21 1431

## 2021-06-28 NOTE — ED Notes (Signed)
Lab at bedside

## 2021-06-29 ENCOUNTER — Inpatient Hospital Stay (HOSPITAL_COMMUNITY): Payer: Medicare Other

## 2021-06-29 ENCOUNTER — Observation Stay (HOSPITAL_COMMUNITY): Payer: Medicare Other

## 2021-06-29 ENCOUNTER — Observation Stay (HOSPITAL_COMMUNITY): Payer: Medicare Other | Admitting: Certified Registered"

## 2021-06-29 ENCOUNTER — Encounter (HOSPITAL_COMMUNITY)
Admission: EM | Disposition: A | Discharge status: Home / Self Care | Payer: Self-pay | Source: Home / Self Care | Attending: Internal Medicine

## 2021-06-29 ENCOUNTER — Encounter (HOSPITAL_COMMUNITY): Payer: Self-pay | Admitting: Internal Medicine

## 2021-06-29 DIAGNOSIS — N136 Pyonephrosis: Secondary | ICD-10-CM | POA: Diagnosis present

## 2021-06-29 DIAGNOSIS — N201 Calculus of ureter: Secondary | ICD-10-CM | POA: Diagnosis not present

## 2021-06-29 DIAGNOSIS — F17210 Nicotine dependence, cigarettes, uncomplicated: Secondary | ICD-10-CM | POA: Diagnosis present

## 2021-06-29 DIAGNOSIS — S80212A Abrasion, left knee, initial encounter: Secondary | ICD-10-CM | POA: Diagnosis present

## 2021-06-29 DIAGNOSIS — R944 Abnormal results of kidney function studies: Secondary | ICD-10-CM | POA: Diagnosis not present

## 2021-06-29 DIAGNOSIS — Y92531 Health care provider office as the place of occurrence of the external cause: Secondary | ICD-10-CM | POA: Diagnosis not present

## 2021-06-29 DIAGNOSIS — T380X5A Adverse effect of glucocorticoids and synthetic analogues, initial encounter: Secondary | ICD-10-CM | POA: Diagnosis present

## 2021-06-29 DIAGNOSIS — R0609 Other forms of dyspnea: Secondary | ICD-10-CM | POA: Diagnosis not present

## 2021-06-29 DIAGNOSIS — S80211A Abrasion, right knee, initial encounter: Secondary | ICD-10-CM | POA: Diagnosis present

## 2021-06-29 DIAGNOSIS — R531 Weakness: Secondary | ICD-10-CM | POA: Diagnosis not present

## 2021-06-29 DIAGNOSIS — J449 Chronic obstructive pulmonary disease, unspecified: Secondary | ICD-10-CM | POA: Diagnosis not present

## 2021-06-29 DIAGNOSIS — W010XXA Fall on same level from slipping, tripping and stumbling without subsequent striking against object, initial encounter: Secondary | ICD-10-CM | POA: Diagnosis present

## 2021-06-29 DIAGNOSIS — D6489 Other specified anemias: Secondary | ICD-10-CM | POA: Diagnosis present

## 2021-06-29 DIAGNOSIS — W19XXXA Unspecified fall, initial encounter: Secondary | ICD-10-CM | POA: Diagnosis not present

## 2021-06-29 DIAGNOSIS — B9620 Unspecified Escherichia coli [E. coli] as the cause of diseases classified elsewhere: Secondary | ICD-10-CM | POA: Diagnosis present

## 2021-06-29 DIAGNOSIS — I10 Essential (primary) hypertension: Secondary | ICD-10-CM | POA: Diagnosis present

## 2021-06-29 DIAGNOSIS — Z79899 Other long term (current) drug therapy: Secondary | ICD-10-CM | POA: Diagnosis not present

## 2021-06-29 DIAGNOSIS — N179 Acute kidney failure, unspecified: Secondary | ICD-10-CM | POA: Diagnosis present

## 2021-06-29 DIAGNOSIS — Z9049 Acquired absence of other specified parts of digestive tract: Secondary | ICD-10-CM | POA: Diagnosis not present

## 2021-06-29 DIAGNOSIS — J9601 Acute respiratory failure with hypoxia: Secondary | ICD-10-CM | POA: Diagnosis not present

## 2021-06-29 DIAGNOSIS — Z85820 Personal history of malignant melanoma of skin: Secondary | ICD-10-CM | POA: Diagnosis not present

## 2021-06-29 DIAGNOSIS — Z20822 Contact with and (suspected) exposure to covid-19: Secondary | ICD-10-CM | POA: Diagnosis present

## 2021-06-29 DIAGNOSIS — I82811 Embolism and thrombosis of superficial veins of right lower extremities: Secondary | ICD-10-CM | POA: Diagnosis present

## 2021-06-29 DIAGNOSIS — R197 Diarrhea, unspecified: Secondary | ICD-10-CM | POA: Diagnosis present

## 2021-06-29 DIAGNOSIS — S0181XA Laceration without foreign body of other part of head, initial encounter: Secondary | ICD-10-CM | POA: Diagnosis present

## 2021-06-29 DIAGNOSIS — E869 Volume depletion, unspecified: Secondary | ICD-10-CM | POA: Diagnosis present

## 2021-06-29 DIAGNOSIS — J441 Chronic obstructive pulmonary disease with (acute) exacerbation: Secondary | ICD-10-CM | POA: Diagnosis not present

## 2021-06-29 DIAGNOSIS — I959 Hypotension, unspecified: Secondary | ICD-10-CM | POA: Diagnosis present

## 2021-06-29 DIAGNOSIS — Z720 Tobacco use: Secondary | ICD-10-CM | POA: Diagnosis not present

## 2021-06-29 DIAGNOSIS — K219 Gastro-esophageal reflux disease without esophagitis: Secondary | ICD-10-CM | POA: Diagnosis present

## 2021-06-29 DIAGNOSIS — N1330 Unspecified hydronephrosis: Secondary | ICD-10-CM | POA: Diagnosis not present

## 2021-06-29 DIAGNOSIS — Z66 Do not resuscitate: Secondary | ICD-10-CM | POA: Diagnosis present

## 2021-06-29 HISTORY — PX: CYSTOSCOPY W/ URETERAL STENT PLACEMENT: SHX1429

## 2021-06-29 LAB — COMPREHENSIVE METABOLIC PANEL
ALT: 22 U/L (ref 0–44)
AST: 27 U/L (ref 15–41)
Albumin: 3.2 g/dL — ABNORMAL LOW (ref 3.5–5.0)
Alkaline Phosphatase: 57 U/L (ref 38–126)
Anion gap: 12 (ref 5–15)
BUN: 47 mg/dL — ABNORMAL HIGH (ref 8–23)
CO2: 24 mmol/L (ref 22–32)
Calcium: 8 mg/dL — ABNORMAL LOW (ref 8.9–10.3)
Chloride: 101 mmol/L (ref 98–111)
Creatinine, Ser: 2.73 mg/dL — ABNORMAL HIGH (ref 0.61–1.24)
GFR, Estimated: 24 mL/min — ABNORMAL LOW (ref >60)
Glucose, Bld: 75 mg/dL (ref 70–99)
Potassium: 4.3 mmol/L (ref 3.5–5.1)
Sodium: 137 mmol/L (ref 135–145)
Total Bilirubin: 1.3 mg/dL — ABNORMAL HIGH (ref 0.3–1.2)
Total Protein: 6.1 g/dL — ABNORMAL LOW (ref 6.5–8.1)

## 2021-06-29 LAB — ECHOCARDIOGRAM COMPLETE
AR max vel: 2.23 cm2
AV Area VTI: 2.31 cm2
AV Area mean vel: 2.23 cm2
AV Mean grad: 9 mmHg
AV Peak grad: 15.7 mmHg
Ao pk vel: 1.98 m/s
Area-P 1/2: 4.15 cm2
Calc EF: 50.7 %
Height: 69 in
MV VTI: 3.15 cm2
S' Lateral: 3.32 cm
Single Plane A2C EF: 47.3 %
Single Plane A4C EF: 53.2 %
Weight: 4000 oz

## 2021-06-29 LAB — RESP PANEL BY RT-PCR (FLU A&B, COVID) ARPGX2
Influenza A by PCR: NEGATIVE
Influenza B by PCR: NEGATIVE
SARS Coronavirus 2 by RT PCR: NEGATIVE

## 2021-06-29 LAB — CBC
HCT: 33.6 % — ABNORMAL LOW (ref 39.0–52.0)
Hemoglobin: 11.2 g/dL — ABNORMAL LOW (ref 13.0–17.0)
MCH: 35.2 pg — ABNORMAL HIGH (ref 26.0–34.0)
MCHC: 33.3 g/dL (ref 30.0–36.0)
MCV: 105.7 fL — ABNORMAL HIGH (ref 80.0–100.0)
Platelets: 215 10*3/uL (ref 150–400)
RBC: 3.18 MIL/uL — ABNORMAL LOW (ref 4.22–5.81)
RDW: 23.8 % — ABNORMAL HIGH (ref 11.5–15.5)
WBC: 19.5 10*3/uL — ABNORMAL HIGH (ref 4.0–10.5)
nRBC: 0 % (ref 0.0–0.2)

## 2021-06-29 LAB — BLOOD GAS, ARTERIAL
Acid-base deficit: 8.6 mmol/L — ABNORMAL HIGH (ref 0.0–2.0)
Bicarbonate: 17.8 mmol/L — ABNORMAL LOW (ref 20.0–28.0)
FIO2: 50
O2 Saturation: 97.2 %
Patient temperature: 37.3
pCO2 arterial: 31.8 mmHg — ABNORMAL LOW (ref 32.0–48.0)
pH, Arterial: 7.327 — ABNORMAL LOW (ref 7.350–7.450)
pO2, Arterial: 107 mmHg (ref 83.0–108.0)

## 2021-06-29 LAB — SARS CORONAVIRUS 2 (TAT 6-24 HRS): SARS Coronavirus 2: NEGATIVE

## 2021-06-29 LAB — HEPARIN LEVEL (UNFRACTIONATED)
Heparin Unfractionated: 0.36 IU/mL (ref 0.30–0.70)
Heparin Unfractionated: 0.21 IU/mL — ABNORMAL LOW (ref 0.30–0.70)

## 2021-06-29 LAB — MRSA NEXT GEN BY PCR, NASAL: MRSA by PCR Next Gen: NOT DETECTED

## 2021-06-29 SURGERY — CYSTOSCOPY, WITH RETROGRADE PYELOGRAM AND URETERAL STENT INSERTION
Anesthesia: General | Laterality: Left

## 2021-06-29 MED ORDER — LACTATED RINGERS IV SOLN
INTRAVENOUS | Status: DC
  Administered 2021-06-29: 500 mL via INTRAVENOUS

## 2021-06-29 MED ORDER — LACTATED RINGERS IV BOLUS
500.0000 mL | Freq: Once | INTRAVENOUS | Status: AC
  Administered 2021-06-29: 500 mL via INTRAVENOUS

## 2021-06-29 MED ORDER — DIATRIZOATE MEGLUMINE 30 % UR SOLN
URETHRAL | Status: AC
  Filled 2021-06-29: qty 100

## 2021-06-29 MED ORDER — TECHNETIUM TO 99M ALBUMIN AGGREGATED
4.0000 | Freq: Once | INTRAVENOUS | Status: AC | PRN
  Administered 2021-06-29: 4.3 via INTRAVENOUS

## 2021-06-29 MED ORDER — ORAL CARE MOUTH RINSE
15.0000 mL | Freq: Two times a day (BID) | OROMUCOSAL | Status: DC
  Administered 2021-06-30 (×2): 15 mL via OROMUCOSAL

## 2021-06-29 MED ORDER — CHLORHEXIDINE GLUCONATE 0.12 % MT SOLN
15.0000 mL | Freq: Two times a day (BID) | OROMUCOSAL | Status: DC
  Administered 2021-06-29 – 2021-06-30 (×2): 15 mL via OROMUCOSAL
  Filled 2021-06-29: qty 15

## 2021-06-29 MED ORDER — PROPOFOL 10 MG/ML IV BOLUS
INTRAVENOUS | Status: AC
  Filled 2021-06-29: qty 40

## 2021-06-29 MED ORDER — CHLORHEXIDINE GLUCONATE 0.12 % MT SOLN
15.0000 mL | Freq: Once | OROMUCOSAL | Status: AC
  Administered 2021-06-29: 15 mL via OROMUCOSAL

## 2021-06-29 MED ORDER — ONDANSETRON HCL 4 MG/2ML IJ SOLN
INTRAMUSCULAR | Status: AC
  Filled 2021-06-29: qty 2

## 2021-06-29 MED ORDER — METHYLPREDNISOLONE SODIUM SUCC 125 MG IJ SOLR
60.0000 mg | Freq: Four times a day (QID) | INTRAMUSCULAR | Status: DC
  Administered 2021-06-29 – 2021-06-30 (×3): 60 mg via INTRAVENOUS
  Filled 2021-06-29 (×3): qty 2

## 2021-06-29 MED ORDER — FENTANYL CITRATE (PF) 100 MCG/2ML IJ SOLN
INTRAMUSCULAR | Status: DC | PRN
  Administered 2021-06-29 (×4): 25 ug via INTRAVENOUS

## 2021-06-29 MED ORDER — DEXAMETHASONE SODIUM PHOSPHATE 10 MG/ML IJ SOLN
INTRAMUSCULAR | Status: AC
  Filled 2021-06-29: qty 1

## 2021-06-29 MED ORDER — SODIUM CHLORIDE 0.9 % IV SOLN
2.0000 g | INTRAVENOUS | Status: DC
  Administered 2021-06-29 – 2021-07-02 (×4): 2 g via INTRAVENOUS
  Filled 2021-06-29 (×4): qty 20

## 2021-06-29 MED ORDER — FENTANYL CITRATE (PF) 100 MCG/2ML IJ SOLN: 25.0000 ug | INTRAMUSCULAR | Status: DC | PRN

## 2021-06-29 MED ORDER — BISMUTH SUBSALICYLATE 262 MG/15ML PO SUSP
30.0000 mL | ORAL | Status: DC | PRN
Start: 2021-06-29 — End: 2021-06-29
  Filled 2021-06-29: qty 118

## 2021-06-29 MED ORDER — FENTANYL CITRATE (PF) 100 MCG/2ML IJ SOLN
INTRAMUSCULAR | Status: AC
  Filled 2021-06-29: qty 2

## 2021-06-29 MED ORDER — PROPOFOL 500 MG/50ML IV EMUL
INTRAVENOUS | Status: DC | PRN
  Administered 2021-06-29: 60 ug/kg/min via INTRAVENOUS

## 2021-06-29 MED ORDER — STERILE WATER FOR IRRIGATION IR SOLN
Status: DC | PRN
  Administered 2021-06-29: 3000 mL

## 2021-06-29 MED ORDER — ONDANSETRON HCL 4 MG/2ML IJ SOLN: 4.0000 mg | Freq: Once | INTRAMUSCULAR | Status: DC | PRN

## 2021-06-29 MED ORDER — CHLORHEXIDINE GLUCONATE CLOTH 2 % EX PADS
6.0000 | MEDICATED_PAD | Freq: Every day | CUTANEOUS | Status: DC
  Administered 2021-06-30 – 2021-07-02 (×3): 6 via TOPICAL

## 2021-06-29 MED ORDER — MIDAZOLAM HCL 2 MG/2ML IJ SOLN
INTRAMUSCULAR | Status: AC
  Filled 2021-06-29: qty 2

## 2021-06-29 MED ORDER — ORAL CARE MOUTH RINSE: 15.0000 mL | Freq: Once | OROMUCOSAL | Status: AC

## 2021-06-29 MED ORDER — MORPHINE BOLUS VIA INFUSION: 2.0000 mg | INTRAVENOUS | Status: DC | PRN

## 2021-06-29 MED ORDER — MORPHINE SULFATE (PF) 2 MG/ML IV SOLN: 2.0000 mg | INTRAVENOUS | Status: DC | PRN

## 2021-06-29 MED ORDER — IPRATROPIUM-ALBUTEROL 0.5-2.5 (3) MG/3ML IN SOLN
3.0000 mL | Freq: Once | RESPIRATORY_TRACT | Status: AC
  Administered 2021-06-29: 3 mL via RESPIRATORY_TRACT

## 2021-06-29 MED ORDER — LIDOCAINE HCL (PF) 2 % IJ SOLN
INTRAMUSCULAR | Status: AC
  Filled 2021-06-29: qty 5

## 2021-06-29 MED ORDER — ONDANSETRON HCL 4 MG/2ML IJ SOLN
INTRAMUSCULAR | Status: DC | PRN
  Administered 2021-06-29: 4 mg via INTRAVENOUS

## 2021-06-29 MED ORDER — KETAMINE HCL 10 MG/ML IJ SOLN
INTRAMUSCULAR | Status: DC | PRN
  Administered 2021-06-29 (×3): 10 mg via INTRAVENOUS

## 2021-06-29 MED ORDER — CHLORHEXIDINE GLUCONATE 0.12 % MT SOLN
OROMUCOSAL | Status: AC
  Filled 2021-06-29: qty 15

## 2021-06-29 MED ORDER — METOCLOPRAMIDE HCL 5 MG/ML IJ SOLN
10.0000 mg | Freq: Once | INTRAMUSCULAR | Status: AC
  Administered 2021-06-29: 10 mg via INTRAVENOUS

## 2021-06-29 MED ORDER — DIATRIZOATE MEGLUMINE 30 % UR SOLN
URETHRAL | Status: DC | PRN
  Administered 2021-06-29: 7 mL via URETHRAL

## 2021-06-29 MED ORDER — MIDAZOLAM HCL 5 MG/5ML IJ SOLN
INTRAMUSCULAR | Status: DC | PRN
  Administered 2021-06-29: .5 mg via INTRAVENOUS

## 2021-06-29 SURGICAL SUPPLY — 15 items
CATH URET 5FR 28IN OPEN ENDED (CATHETERS) ×1 IMPLANT
CLOTH BEACON ORANGE TIMEOUT ST (SAFETY) ×1 IMPLANT
GLOVE SS BIOGEL STRL SZ 7.5 (GLOVE) IMPLANT
GLOVE SUPERSENSE BIOGEL SZ 7.5 (GLOVE) ×1
GLOVE SURG POLYISO LF SZ6.5 (GLOVE) ×1 IMPLANT
GLOVE SURG POLYISO LF SZ7.5 (GLOVE) ×1 IMPLANT
GLOVE SURG UNDER POLY LF SZ7.5 (GLOVE) ×1 IMPLANT
GUIDEWIRE STR DUAL SENSOR (WIRE) ×1 IMPLANT
GUIDEWIRE STR ZIPWIRE 035X150 (MISCELLANEOUS) ×1 IMPLANT
KIT TURNOVER KIT A (KITS) ×1 IMPLANT
MANIFOLD NEPTUNE II (INSTRUMENTS) ×1 IMPLANT
PACK CYSTO (CUSTOM PROCEDURE TRAY) ×1 IMPLANT
STENT CONTOUR 6FRX26X.038 (STENTS) ×1 IMPLANT
TOWEL OR 17X26 4PK STRL BLUE (TOWEL DISPOSABLE) ×2 IMPLANT
WATER STERILE IRR 3000ML UROMA (IV SOLUTION) ×1 IMPLANT

## 2021-06-29 NOTE — Op Note (Signed)
Preoperative diagnosis: Left ureteral stone and elevated creatinine Postoperative diagnosis: Left ureteral stone and elevated serum creatinine Surgery: Cystoscopy left retrograde ureterogram and insertion left ureteral stent Surgeon: Dr. Nicki Reaper Marshea Wisher  Patient is above diagnosis and consented the above procedure.  Extra care was taken leg positioning.  He was given preoperative antibiotics.  He was having left flank pain and elevated creatinine  Pros cons and risk described.  Sequelae described.  Patient underwent rigid cystoscopy with a 23 French cystoscope.  Penile bulbar membranous urethra normal.  He had bilobar enlargement the prostate.  He had a prominent trigone with grade 1 out of 4 bladder trabeculation.  Urine was a bit cloudy.  There was no mucosal abnormalities  Under fluoroscopic guidance I passed a sensor wire to the upper third of the left ureter.  A 6 French open-ended ureteral catheter was passed over it.  I did a retrograde.  Retrograde ureterogram: I did a retrograde with approximately 5 cc of contrast.  He had mild hydro nephrosis.  Sensor wire was passed curling in the lower pole of the left kidney.  Over the wire under cystoscopic and fluoroscopic guidance I passed a 26 cm x 6 French open ureteral stent well-prepared.  The stent went in nicely.  I had to use firm pressure to insert the stent.  There was no volcano effect  X-rays were taken.  Stent was in good position.  Bladder was emptied.  Patient was taken to recovery room

## 2021-06-29 NOTE — Evaluation (Signed)
Physical Therapy Evaluation Patient Details Name: Sergio Schroeder MRN: [id number] DOB: [birth year] Today's Date: 06/29/2021   History of Present Illness  Sergio Schroeder is a 75 y.o. male with medical history significant of hypertension, tobacco use, was reportedly at the [facility] today and had squatted down to change his license plate.  Upon standing, patient reports that he lost his balance and fell.  He initially fell on his knees causing abrasions to his knees.  When he tried to get up again, he fell again and hit his head.  He did not lose consciousness.  He feels generally weak.  He has felt this way for the past few days.  Reported that his p.o. intake has been poor the past few days.  He is also noted decreased urine output and dark urine for the past few days.  His son reports that patient did complain of abdominal pain along his lower abdomen for the past few days with decreased p.o. intake.  His son is concerned that he may have diverticulitis since his symptoms did start after eating cucumbers and seedlings watermelon.  Patient reports having chronic diarrhea, but this is slowed down over the past few days.  He has not had any vomiting.  He reports chronic shortness of breath, but worse over the past few days.  He has not had any fever or cough.   Clinical Impression  Patient functioning near baseline for functional mobility and gait but is limited by unsteadiness with standing/ambulating. Patient seated EOB at beginning of session with family present. Patient demonstrating good sitting balance and tolerance EOB. Patient transfers to standing without AD but is unsteady upon standing reaching for IV pole for support. Patient ambulates with slightly unsteady cadence with IV pole without loss of balance. Patient returned to bed at end of session. Patient discharged to care of nursing for ambulation daily as tolerated for length of stay.     Follow Up Recommendations No PT follow up    Equipment  Recommendations  None recommended by PT    Recommendations for Other Services       Precautions / Restrictions Precautions Precautions: Fall Restrictions Weight Bearing Restrictions: No      Mobility  Bed Mobility               General bed mobility comments: seated EOB at beginning of session    Transfers Overall transfer level: Needs assistance Equipment used: None Transfers: Sit to/from Stand;Stand Pivot Transfers Sit to Stand: Min guard Stand pivot transfers: Min guard       General transfer comment: transfers to standing without AD, unsteady upon standing reaching for IV pole for support  Ambulation/Gait Ambulation/Gait assistance: Min guard Gait Distance (Feet): 40 Feet Assistive device: IV Pole Gait Pattern/deviations: Step-through pattern;Decreased stride length Gait velocity: decreased   General Gait Details: decreased cadence with IV pole for support, intermittent unsteadiness without loss of balance  Stairs            Wheelchair Mobility    Modified Rankin (Stroke Patients Only)       Balance Overall balance assessment: Modified Independent                                           Pertinent Vitals/Pain Pain Assessment: No/denies pain    Home Living Family/patient expects to be discharged to:: Private residence Living Arrangements: Alone Available Help  at Discharge: Family Type of Home: Mobile home Home Access: Stairs to enter Entrance Stairs-Rails: Left Entrance Stairs-Number of Steps: 3 Home Layout: One level Home Equipment: None      Prior Function Level of Independence: Needs assistance   Gait / Transfers Assistance Needed: patient states mostly household ambulation without AD  ADL's / Homemaking Assistance Needed: Independent with ADL        Hand Dominance        Extremity/Trunk Assessment   Upper Extremity Assessment Upper Extremity Assessment: Overall WFL for tasks assessed    Lower  Extremity Assessment Lower Extremity Assessment: Overall WFL for tasks assessed    Cervical / Trunk Assessment Cervical / Trunk Assessment: Normal  Communication   Communication: No difficulties  Cognition Arousal/Alertness: Awake/alert Behavior During Therapy: WFL for tasks assessed/performed Overall Cognitive Status: Within Functional Limits for tasks assessed                                        General Comments      Exercises     Assessment/Plan    PT Assessment Patent does not need any further PT services  PT Problem List         PT Treatment Interventions      PT Goals (Current goals can be found in the Care Plan section)  Acute Rehab PT Goals Patient Stated Goal: Return home PT Goal Formulation: With patient Time For Goal Achievement: 06/29/21 Potential to Achieve Goals: Good    Frequency     Barriers to discharge        Co-evaluation               AM-PAC PT "6 Clicks" Mobility  Outcome Measure Help needed turning from your back to your side while in a flat bed without using bedrails?: None Help needed moving from lying on your back to sitting on the side of a flat bed without using bedrails?: None Help needed moving to and from a bed to a chair (including a wheelchair)?: A Little Help needed standing up from a chair using your arms (e.g., wheelchair or bedside chair)?: A Little Help needed to walk in hospital room?: A Little Help needed climbing 3-5 steps with a railing? : A Little 6 Click Score: 20    End of Session   Activity Tolerance: Patient tolerated treatment well Patient left: in bed;with call bell/phone within reach;with family/visitor present Nurse Communication: Mobility status PT Visit Diagnosis: Unsteadiness on feet (R26.81);History of falling (Z91.81);Other abnormalities of gait and mobility (R26.89)    Time: 5916-3846 PT Time Calculation (min) (ACUTE ONLY): 8 min   Charges:   PT Evaluation $PT Eval Low  Complexity: 1 Low          10:25 AM, 06/29/21 Mearl Latin PT, DPT Physical Therapist at [facility]

## 2021-06-29 NOTE — H&P (Signed)
Urology Consult  Referring physician: Pearletha Forge Reason for referral: Hydronephrosis  Chief Complaint: hydronephrosis  History of Present Illness: Patient with medical comorbidities was brought in the hospital left-sided pain.  He is chronically short of breath.  He had a  CT scan demonstrated a stone at the left ureteropelvic junction.  His renal function worsened overnight and his creatinine went from 2.1-2.7.  No fever.  Was started on ceftriaxone   White blood count 19.5.   The CT scan demonstrated 6 mm stone in the proximal left ureter at the level of the ureteropelvic junction.  His left side hydronephrosis with stranding.  Has had multiple stones and ESWL/pass them/open surgery at [facility]  Past Medical History:  Diagnosis Date   Atypical mole 09/09/2014   malignant spindle cell-right temple   Basal cell carcinoma 10/16/2017   top left sholder-txpbx   BCC (basal cell carcinoma) 04/21/2017   right side-cx65fu   BCC (basal cell carcinoma) 08/30/2014   back-cx9fu   BCC (basal cell carcinoma) 07/05/1994   left temple-excision   BCC (basal cell carcinoma) 03/20/2009   right neck-excision   BCC (basal cell carcinoma) 03/20/2009   mid chest superior-cx14fu   BCC (basal cell carcinoma) 03/20/2009   mid chest inferior-cx37fu   BCC (basal cell carcinoma) 10/06/2012   right chest-cx15fu   Colon polyps    GERD (gastroesophageal reflux disease)    H/O renal calculi    Hypertension    Melanoma ([facility]) 08/04/2012   L lateral torso/left flank-tx Dr.Leshin   SCC (squamous cell carcinoma) 10/16/2017   right top scalp-mohs   SCC (squamous cell carcinoma) 10/16/2017   mid abdomen-txpbx   SCC (squamous cell carcinoma) 04/21/2018   lower lip-mohs   SCC (squamous cell carcinoma) 07/27/2018   right post scalp,left jawline,left temple, right temple,right sideburn   SCC (squamous cell carcinoma) 12/28/2018   medial scalp, left post scalp   SCC (squamous cell carcinoma) 02/16/2020   left ear  rim, left inner ear,left sideburn superior,left sideburn inferior,tip of nose   SCC (squamous cell carcinoma) 01/10/2016   right forehead-mohs, right inner wrist-cx68fu   SCC (squamous cell carcinoma) 03/21/2016   left abdomen   SCC (squamous cell carcinoma) 09/24/2016   left cheek superior,inferior left cheek,inferior left cheek medial,left sideburn,right temple superior,right temple inferior   SCC (squamous cell carcinoma) 12/12/2016   left inner cheek-txpbx   SCC (squamous cell carcinoma) 04/21/2017   right temple superior-mohs,right inner cheek, left cheek superior-mohs   SCC (squamous cell carcinoma) 01/12/2013   right cheek-cx31fu,right hand-clear   SCC (squamous cell carcinoma) 03/18/2013   right jawline-txpbx   SCC (squamous cell carcinoma) 11/23/2013   left inner cheek, right scalp, right forehead,right cheek-cx68fu   SCC (squamous cell carcinoma) 01/06/2014   right temple-mohs   SCC (squamous cell carcinoma) 08/30/2014   back-cx23fu, left cheek-cx35u-excision   SCC (squamous cell carcinoma) 01/25/2015   right temple,right temple inf, left cheek-cx37fu   SCC (squamous cell carcinoma) 09/09/2015   post scalp,right forhead lateral, right temple   SCC (squamous cell carcinoma) 03/20/2009   left temple   SCC (squamous cell carcinoma) 01/14/2011   left hairline, bridge of nose   SCC (squamous cell carcinoma) 10/07/2011   under right inner eye, left cheek near temple, right cheek   SCC (squamous cell carcinoma) 10/12/2015   frontal scalp   SCCA (squamous cell carcinoma) of skin 06/27/2020   Left Hand Posterior (well diff)   SCCA (squamous cell carcinoma) of skin 06/27/2020   Right Hand Posterior (  well diff)   SCCA (squamous cell carcinoma) of skin 06/27/2020   Scalp (well diff)   SCCA (squamous cell carcinoma) of skin 06/27/2020   Right Ear Rim (well diff)   SCCA (squamous cell carcinoma) of skin 09/14/2020   well diff-mid forehead   SCCA (squamous cell carcinoma) of  skin 09/14/2020   in situ-mid parietal scalp-anterior   SCCA (squamous cell carcinoma) of skin 09/14/2020   mid parietal scalp-posterior   Skin cancer    Squamous cell carcinoma of skin 10/16/2017   left top scalp-txpbx   Past Surgical History:  Procedure Laterality Date   BILIARY STENT PLACEMENT N/A 05/20/2013   Procedure: BILIARY STENT PLACEMENT;  Surgeon: Daneil Dolin, MD;  Location: [facility];  Service: Gastroenterology;  Laterality: N/A;   CHOLECYSTECTOMY N/A 0/62/6948   complicated by cystic duct stump leak and gb fossa abscess. Procedure: LAPAROSCOPIC CHOLECYSTECTOMY;  Surgeon: Jamesetta So, MD;  Location: [facility];  Service: General;  Laterality: N/A;   COLONOSCOPY  2004   Dr. Laural Golden: few scattered diverticula at sigmoid and transverse colon, polyps. path unknown.    COLONOSCOPY  July 2004   Dr. Gala Romney. Few scattered diverticula sigmoid and transverse colon, 2 small rectal polyps (path unavailable)   COLONOSCOPY N/A 09/21/2013   Procedure: COLONOSCOPY;  Surgeon: Daneil Dolin, MD;  Location: [facility];  Service: Endoscopy;  Laterality: N/A;  10:30 AM   CYSTOSCOPY     ERCP N/A 05/20/2013   RMR: cystic duct stump leak s/p cholecystectomy. +choledocholithiasis, s/p sphinterotomy, stent placement, stone extraction   ERCP N/A 07/22/2013   RMR: S/P removal of biliary stent along with removal of common duct stones as described above, status post sphincterotomy balloon dilation and balloon dredging of the biliary tree Previously noted biliary leak sealed/Duodenal diverticulum   ESOPHAGOGASTRODUODENOSCOPY N/A 06/08/2013   [place] esophageal erosions in the distal one half of the esophagus. 2 cm segment of markedly inflamed and swollen duodenal mucosa at the junction of the bulb and second portion. Significant encroachment on the lumen causing GOO. Bx: benign  Surgeon: Daneil Dolin, MD;  [facility];  Service: Endoscopy;  Laterality: N/A;  WITH PHENERGAN 12.5 mg IV on call     EXTRACORPOREAL SHOCK WAVE LITHOTRIPSY     EYE SURGERY     EYE SURGERY Left 35 years ago   KIDNEY STONE SURGERY     MELANOMA EXCISION Left 08/13   [facility]   REMOVAL OF STONES N/A 07/22/2013   Procedure: REMOVAL OF common bile duct STONES;  Surgeon: Daneil Dolin, MD;  Location: [facility];  Service: Endoscopy;  Laterality: N/A;   SPHINCTEROTOMY N/A 05/20/2013   Procedure: SPHINCTEROTOMY;  Surgeon: Daneil Dolin, MD;  Location: [facility];  Service: Gastroenterology;  Laterality: N/A;   SPHINCTEROTOMY N/A 07/22/2013   Procedure: SPHINCTEROTOMY with dilation;  Surgeon: Daneil Dolin, MD;  Location: [facility];  Service: Endoscopy;  Laterality: N/A;   STENT REMOVAL N/A 07/22/2013   Procedure: Biliary STENT REMOVAL;  Surgeon: Daneil Dolin, MD;  Location: [facility];  Service: Endoscopy;  Laterality: N/A;    Medications: I have reviewed the patient's current medications. Allergies: No Known Allergies  Family History  Problem Relation Age of Onset   Cancer Father    Cancer Brother    Colon cancer Neg Hx    Social History:  reports that he has been smoking. He has a 25.00 pack-year smoking history. He has never used smokeless tobacco. He reports that he  does not drink alcohol and does not use drugs.  ROS: All systems are reviewed and negative except as noted. Rest negative  Physical Exam:  Vital signs in last 24 hours: Temp:  [97.8 F (36.6 C)-99 F (37.2 C)] 98.2 F (36.8 C) (07/08 0844) Pulse Rate:  [86-130] 91 (07/08 0844) Resp:  [18-38] 20 (07/08 0844) BP: (90-134)/(43-113) 122/63 (07/08 0844) SpO2:  [90 %-95 %] 93 % (07/08 0844) Weight:  [113.4 kg] 113.4 kg (07/07 1145)  Cardiovascular: Skin warm; not flushed Respiratory: Breaths quiet; no shortness of breath Abdomen: No masses Neurological: Normal sensation to touch Musculoskeletal: Normal motor function arms and legs Lymphatics: No inguinal adenopathy Skin: No rashes Genitourinary:no distress  Laboratory Data:  Results for orders  placed or performed during the hospital encounter of 06/28/21 (from the past 72 hour(s))  Urinalysis, Routine w reflex microscopic Urine, Clean Catch     Status: Abnormal   Collection Time: 06/28/21 12:33 PM  Result Value Ref Range   Color, Urine YELLOW YELLOW   APPearance TURBID (A) CLEAR   Specific Gravity, Urine 1.018 1.005 - 1.030   pH 5.0 5.0 - 8.0   Glucose, UA NEGATIVE NEGATIVE mg/dL   Hgb urine dipstick MODERATE (A) NEGATIVE   Bilirubin Urine NEGATIVE NEGATIVE   Ketones, ur 5 (A) NEGATIVE mg/dL   Protein, ur 100 (A) NEGATIVE mg/dL   Nitrite NEGATIVE NEGATIVE   Leukocytes,Ua LARGE (A) NEGATIVE   RBC / HPF >50 (H) 0 - 5 RBC/hpf   WBC, UA >50 (H) 0 - 5 WBC/hpf   Bacteria, UA NONE SEEN NONE SEEN   Squamous Epithelial / LPF 0-5 0 - 5   WBC Clumps PRESENT     Comment: Performed at [facility], [address], [place], [place] [address]  Comprehensive metabolic panel     Status: Abnormal   Collection Time: 06/28/21 12:47 PM  Result Value Ref Range   Sodium 137 135 - 145 mmol/L   Potassium 3.8 3.5 - 5.1 mmol/L   Chloride 102 98 - 111 mmol/L   CO2 26 22 - 32 mmol/L   Glucose, Bld 90 70 - 99 mg/dL    Comment: Glucose reference range applies only to samples taken after fasting for at least 8 hours.   BUN 35 (H) 8 - 23 mg/dL   Creatinine, Ser 2.25 (H) 0.61 - 1.24 mg/dL   Calcium 8.3 (L) 8.9 - 10.3 mg/dL   Total Protein 6.5 6.5 - 8.1 g/dL   Albumin 3.2 (L) 3.5 - 5.0 g/dL   AST 31 15 - 41 U/L   ALT <5 0 - 44 U/L   Alkaline Phosphatase 116 38 - 126 U/L   Total Bilirubin 1.8 (H) 0.3 - 1.2 mg/dL   GFR, Estimated 30 (L) >60 mL/min    Comment: (NOTE) Calculated using the CKD-EPI Creatinine Equation (2021)    Anion gap 9 5 - 15    Comment: Performed at [facility], [address] [place], [place] [address]  CBC with Differential     Status: Abnormal   Collection Time: 06/28/21 12:47 PM  Result Value Ref Range   WBC 7.1 4.0 - 10.5 K/uL   RBC 3.49 (L) 4.22 - 5.81 MIL/uL    Hemoglobin 12.5 (L) 13.0 - 17.0 g/dL   HCT 36.6 (L) 39.0 - 52.0 %   MCV 104.9 (H) 80.0 - 100.0 fL   MCH 35.8 (H) 26.0 - 34.0 pg   MCHC 34.2 30.0 - 36.0 g/dL   RDW 23.4 (H)  11.5 - 15.5 %   Platelets 229 150 - 400 K/uL   nRBC 1.7 (H) 0.0 - 0.2 %   Neutrophils Relative % 96 %   Neutro Abs 6.9 1.7 - 7.7 K/uL   Lymphocytes Relative 2 %   Lymphs Abs 0.1 (L) 0.7 - 4.0 K/uL   Monocytes Relative 2 %   Monocytes Absolute 0.1 0.1 - 1.0 K/uL   Eosinophils Relative 0 %   Eosinophils Absolute 0.0 0.0 - 0.5 K/uL   Basophils Relative 0 %   Basophils Absolute 0.0 0.0 - 0.1 K/uL   Immature Granulocytes 0 %   Abs Immature Granulocytes 0.03 0.00 - 0.07 K/uL    Comment: Performed at [facility], [address], [place], [place] [address]  CK     Status: None   Collection Time: 06/28/21 12:47 PM  Result Value Ref Range   Total CK 156 49 - 397 U/L    Comment: Performed at [facility], [address], [place], [place] [address]  Magnesium     Status: Abnormal   Collection Time: 06/28/21 12:47 PM  Result Value Ref Range   Magnesium 1.4 (L) 1.7 - 2.4 mg/dL    Comment: Performed at [facility], [address], [place], [place] [address]  Ethanol     Status: None   Collection Time: 06/28/21 12:48 PM  Result Value Ref Range   Alcohol, Ethyl (B) <10 <10 mg/dL    Comment: (NOTE) Lowest detectable limit for serum alcohol is 10 mg/dL.  For medical purposes only. Performed at [facility], [address] [place], [place] [address]   D-dimer, quantitative     Status: Abnormal   Collection Time: 06/28/21  8:21 PM  Result Value Ref Range   D-Dimer, Quant 6.84 (H) 0.00 - 0.50 ug/mL-FEU    Comment: (NOTE) At the manufacturer cut-off value of 0.5 g/mL FEU, this assay has a negative predictive value of 95-100%.This assay is intended for use in conjunction with a clinical pretest probability (PTP) assessment model to exclude pulmonary embolism (PE) and deep venous thrombosis (DVT) in outpatients suspected  of PE or DVT. Results should be correlated with clinical presentation. Performed at [facility], [address], [place], [place] [address]   Brain natriuretic peptide     Status: Abnormal   Collection Time: 06/28/21  8:21 PM  Result Value Ref Range   B Natriuretic Peptide 207.0 (H) 0.0 - 100.0 pg/mL    Comment: Performed at [facility], [address] [place], [place] [address]  Heparin level (unfractionated)     Status: None   Collection Time: 06/29/21  3:39 AM  Result Value Ref Range   Heparin Unfractionated 0.36 0.30 - 0.70 IU/mL    Comment: (NOTE) The clinical reportable range upper limit is being lowered to >1.10 to align with the FDA approved guidance for the current laboratory assay.  If heparin results are below expected values, and patient dosage has  been confirmed, suggest follow up testing of antithrombin III levels. Performed at [facility], [address] [place],  [address]   CBC     Status: Abnormal   Collection Time: 06/29/21  3:39 AM  Result Value Ref Range   WBC 19.5 (H) 4.0 - 10.5 K/uL   RBC 3.18 (L) 4.22 - 5.81 MIL/uL   Hemoglobin 11.2 (L) 13.0 - 17.0 g/dL   HCT 33.6 (L) 39.0 - 52.0 %   MCV 105.7 (H) 80.0 - 100.0 fL   MCH 35.2 (H) 26.0 -  34.0 pg   MCHC 33.3 30.0 - 36.0 g/dL   RDW 23.8 (H) 11.5 - 15.5 %   Platelets 215 150 - 400 K/uL   nRBC 0.0 0.0 - 0.2 %    Comment: Performed at [facility], [address] [place], [place] [address]  Comprehensive metabolic panel     Status: Abnormal   Collection Time: 06/29/21  3:39 AM  Result Value Ref Range   Sodium 137 135 - 145 mmol/L   Potassium 4.3 3.5 - 5.1 mmol/L   Chloride 101 98 - 111 mmol/L   CO2 24 22 - 32 mmol/L   Glucose, Bld 75 70 - 99 mg/dL    Comment: Glucose reference range applies only to samples taken after fasting for at least 8 hours.   BUN 47 (H) 8 - 23 mg/dL   Creatinine, Ser 2.73 (H) 0.61 - 1.24 mg/dL   Calcium 8.0 (L) 8.9 - 10.3 mg/dL   Total Protein 6.1 (L) 6.5 - 8.1 g/dL    Albumin 3.2 (L) 3.5 - 5.0 g/dL   AST 27 15 - 41 U/L   ALT 22 0 - 44 U/L   Alkaline Phosphatase 57 38 - 126 U/L   Total Bilirubin 1.3 (H) 0.3 - 1.2 mg/dL   GFR, Estimated 24 (L) >60 mL/min    Comment: (NOTE) Calculated using the CKD-EPI Creatinine Equation (2021)    Anion gap 12 5 - 15    Comment: Performed at [facility], [address] [place], [place] [address]   No results found for this or any previous visit (from the past 240 hour(s)). Creatinine: Recent Labs    06/28/21 1247 06/29/21 0339  CREATININE 2.25* 2.73*    Xrays: See report/chart   As noted  Impression/Assessment:   Patient has left ureteral stone.  He has worsening his serum creatinine. Mild left flank pain  Plan:  Picture drawn: stent and retrograde and fuop with Dr Noah Delaine locally; pros and cons and sequalae disussed  Athenia Rys A Mathieu Schloemer 06/29/2021, 11:08 AM

## 2021-06-29 NOTE — Progress Notes (Signed)
Patient place on Enteric precaution per MD order's per Infection control policy. Patient and family educated on Enteric Isolation,verbalized understanding. Educational care notes given.Will continue to monitor patient .Marland Kitchen

## 2021-06-29 NOTE — Progress Notes (Signed)
PROGRESS NOTE    Sergio Schroeder  MWU:[id number] DOB: [birth year] DOA: 06/28/2021 PCP: [name] [facility]    Brief Narrative:  75 y/o male with history of HTN, tobacco, had a fall prior to admission while he was bent over working on his car. On arrival to ED, noted to have AKI, tachycardia and low BP. Further imaging indicated left hydronephrosis with UPJ stone. He is chronically short of breath and likely has some degree of COPD. Urology consulted for UPJ stone.    Assessment & Plan:   Active Problems:   AKI (acute kidney injury) ([place])   HTN (hypertension)   Tobacco abuse   Fall   Generalized weakness   Acute kidney injury -patient had decreased po intake and was also taking ACE/HCTZ -he is also noted to have a left UPJ stone with obstruction -would continue on IV fluids -Urology consulted -creatinine currently trending up 2.2->2.7  Left hydronephrosis with UPJ stone -urology consulted -start on ceftriaxone to cover any element of infection -check urine culture -keep npo for now  Diarrhea -reports having 6 loose stools overnight -check stool studies -use imodium if C diff negative  Probable COPD -long history of tobacco use -continues to have some shortness of breath and wheezing -continue on bronchodilators -continue steroids  Tachycardia - overall heart seems to have improved with IV fluids -echo done, report pending, BNP mildly elevated -D dimer elevated at 6.84, empirically started on IV heparin -check VQ scan, if negative, DC heparin  Fall/Generalized weakness -patient had fall prior to admission while he was bent over trying to adjust his license plate -he did report hitting his head, no LOC, CT head negative -also suffered abrasions to his knees and forehead -seen by PT with no further PT recommended.   HTN -BP running on lower side on admission -continue to hold ACE/HCTZ for now   DVT prophylaxis: heparin infusion  Code Status: DNR Family  Communication: left voicemail for patient's son Disposition Plan: Status is: Observation  The patient will require care spanning > 2 midnights and should be moved to inpatient because: Ongoing diagnostic testing needed not appropriate for outpatient work up, IV treatments appropriate due to intensity of illness or inability to take PO, and Inpatient level of care appropriate due to severity of illness  Dispo: The patient is from: Home              Anticipated d/c is to: Home              Patient currently is not medically stable to d/c.   Difficult to place patient No     Consultants:  urology  Procedures:    Antimicrobials:  Ceftriaxone 7/8>    Subjective: Continues to have some left lower abdominal pain. Reports several loose stools overnight. No chest pain. Feels that shortness of breath is about the same as yesterday. Feels that urine output may be minimally improved today  Objective: Vitals:   06/28/21 2034 06/28/21 2231 06/29/21 0031 06/29/21 0844  BP: (!) 97/55 (!) 99/54 96/61 122/63  Pulse: 94 92 86 91  Resp: (!) 28 (!) 24 (!) 24 20  Temp: 97.8 F (36.6 C) 98.2 F (36.8 C) 98.1 F (36.7 C) 98.2 F (36.8 C)  TempSrc: Oral Oral Oral Oral  SpO2: 93% 91% 93% 93%  Weight:      Height:        Intake/Output Summary (Last 24 hours) at 06/29/2021 1044 Last data filed at 06/29/2021 0900 Gross per 24  hour  Intake 2386.08 ml  Output --  Net 2386.08 ml   Filed Weights   06/28/21 1145  Weight: 113.4 kg    Examination:  General exam: Appears calm and comfortable  Respiratory system: mild wheeze bilaterally. Mild increased resp effort Cardiovascular system: S1 & S2 heard, RRR. No JVD, murmurs, rubs, gallops or clicks. No pedal edema. Gastrointestinal system: Abdomen is protuberant, soft and mild tenderness in LLQ No organomegaly or masses felt. Normal bowel sounds heard. Central nervous system: Alert and oriented. No focal neurological deficits. Extremities:  Symmetric 5 x 5 power. Skin: No rashes, lesions or ulcers Psychiatry: Judgement and insight appear normal. Mood & affect appropriate.     Data Reviewed: I have personally reviewed following labs and imaging studies  CBC: Recent Labs  Lab 06/28/21 1247 06/29/21 0339  WBC 7.1 19.5*  NEUTROABS 6.9  --   HGB 12.5* 11.2*  HCT 36.6* 33.6*  MCV 104.9* 105.7*  PLT 229 614   Basic Metabolic Panel: Recent Labs  Lab 06/28/21 1247 06/29/21 0339  NA 137 137  K 3.8 4.3  CL 102 101  CO2 26 24  GLUCOSE 90 75  BUN 35* 47*  CREATININE 2.25* 2.73*  CALCIUM 8.3* 8.0*  MG 1.4*  --    GFR: Estimated Creatinine Clearance: 29.9 mL/min (A) (by C-G formula based on SCr of 2.73 mg/dL (H)). Liver Function Tests: Recent Labs  Lab 06/28/21 1247 06/29/21 0339  AST 31 27  ALT <5 22  ALKPHOS 116 57  BILITOT 1.8* 1.3*  PROT 6.5 6.1*  ALBUMIN 3.2* 3.2*   No results for input(s): LIPASE, AMYLASE in the last 168 hours. No results for input(s): AMMONIA in the last 168 hours. Coagulation Profile: No results for input(s): INR, PROTIME in the last 168 hours. Cardiac Enzymes: Recent Labs  Lab 06/28/21 1247  CKTOTAL 156   BNP (last 3 results) No results for input(s): PROBNP in the last 8760 hours. HbA1C: No results for input(s): HGBA1C in the last 72 hours. CBG: No results for input(s): GLUCAP in the last 168 hours. Lipid Profile: No results for input(s): CHOL, HDL, LDLCALC, TRIG, CHOLHDL, LDLDIRECT in the last 72 hours. Thyroid Function Tests: No results for input(s): TSH, T4TOTAL, FREET4, T3FREE, THYROIDAB in the last 72 hours. Anemia Panel: No results for input(s): VITAMINB12, FOLATE, FERRITIN, TIBC, IRON, RETICCTPCT in the last 72 hours. Sepsis Labs: No results for input(s): PROCALCITON, LATICACIDVEN in the last 168 hours.  No results found for this or any previous visit (from the past 240 hour(s)).       Radiology Studies: CT ABDOMEN PELVIS WO CONTRAST  Result Date:  06/28/2021 CLINICAL DATA:  Decreased p.o. intake and dark urine. Lower abdominal pain. EXAM: CT ABDOMEN AND PELVIS WITHOUT CONTRAST TECHNIQUE: Multidetector CT imaging of the abdomen and pelvis was performed following the standard protocol without IV contrast. COMPARISON:  06/08/2013 FINDINGS: Lower chest: Dependent atelectasis in the lung bases. Cardiac enlargement. Coronary artery calcification. Hepatobiliary: Circumscribed low-attenuation lesions in the liver are unchanged since prior study, likely cysts. Focal calcification in the posterior right lobe of the liver corresponds to a cyst in the previous study, possibly calcified cyst. Gallbladder is surgically absent. No bile duct dilatation. Pancreas: Unremarkable. No pancreatic ductal dilatation or surrounding inflammatory changes. Spleen: Normal in size without focal abnormality. Adrenals/Urinary Tract: No adrenal gland nodules. 6 mm stone in the left proximal ureter at the level of the ureteropelvic junction. Left hydronephrosis with stranding around the left kidney. Distal ureter is decompressed.  No hydronephrosis or hydroureter on the right. Right renal cysts. Small calcification versus hemorrhagic cyst on the anterior right parenchyma. Bladder is decompressed. Stomach/Bowel: Stomach is within normal limits. Appendix appears normal. No evidence of bowel wall thickening, distention, or inflammatory changes. Vascular/Lymphatic: Aortic atherosclerosis. No enlarged abdominal or pelvic lymph nodes. Reproductive: Prostate is unremarkable. Other: No abdominal wall hernia or abnormality. No abdominopelvic ascites. Musculoskeletal: Degenerative changes in the spine. No destructive bone lesions. IMPRESSION: Stone in the left ureteropelvic junction with moderate proximal obstruction. Incidental finding of multiple hepatic parenchymal cysts. Right renal cyst. Cardiac enlargement. Moderate aortic atherosclerosis. Electronically Signed   By: Lucienne Capers M.D.   On:  06/28/2021 22:08   CT Head Wo Contrast  Result Date: 06/28/2021 CLINICAL DATA:  Head trauma, minor. Additional history provided: Fall today, laceration to left forehead. EXAM: CT HEAD WITHOUT CONTRAST TECHNIQUE: Contiguous axial images were obtained from the base of the skull through the vertex without intravenous contrast. COMPARISON:  No pertinent prior exams available for comparison. FINDINGS: Brain: Mild generalized cerebral atrophy. There is no acute intracranial hemorrhage. No demarcated cortical infarct. No extra-axial fluid collection. No evidence of an intracranial mass. No midline shift. Vascular: No hyperdense vessel.  Atherosclerotic calcifications. Skull: Normal. Negative for fracture or focal lesion. Sinuses/Orbits: Visualized orbits show no acute finding. Trace bilateral maxillary sinus mucosal thickening at the imaged levels. Other: Bilateral middle ear/mastoid effusions. IMPRESSION: No evidence of acute intracranial abnormality. Mild generalized cerebral atrophy. Bilateral middle ear/mastoid effusions. Electronically Signed   By: Kellie Simmering DO   On: 06/28/2021 14:23   DG Chest Port 1 View  Result Date: 06/28/2021 CLINICAL DATA:  Shortness of breath. EXAM: PORTABLE CHEST 1 VIEW COMPARISON:  June 11, 2013. FINDINGS: Stable cardiomegaly. Left lung is clear. Minimal right basilar subsegmental atelectasis. The visualized skeletal structures are unremarkable. IMPRESSION: Minimal right basilar subsegmental atelectasis. Electronically Signed   By: Marijo Conception M.D.   On: 06/28/2021 13:10        Scheduled Meds:  budesonide (PULMICORT) nebulizer solution  0.25 mg Nebulization BID   ipratropium  0.5 mg Nebulization TID   levalbuterol  0.63 mg Nebulization TID   predniSONE  40 mg Oral Q breakfast   Continuous Infusions:  sodium chloride 100 mL/hr at 06/28/21 1420   cefTRIAXone (ROCEPHIN)  IV     heparin 1,500 Units/hr (06/29/21 0602)     LOS: 0 days    Time spent:  42mins    Kathie Dike, MD [facility]   If 7PM-7AM, please contact night-coverage [email or web address]  06/29/2021, 10:44 AM

## 2021-06-29 NOTE — H&P (View-Only) (Signed)
Urology Consult  Referring physician: Pearletha Forge Reason for referral: Hydronephrosis  Chief Complaint: hydronephrosis  History of Present Illness: Patient with medical comorbidities was brought in the hospital left-sided pain.  He is chronically short of breath.  He had a  CT scan demonstrated a stone at the left ureteropelvic junction.  His renal function worsened overnight and his creatinine went from 2.1-2.7.  No fever.  Was started on ceftriaxone   White blood count 19.5.   The CT scan demonstrated 6 mm stone in the proximal left ureter at the level of the ureteropelvic junction.  His left side hydronephrosis with stranding.  Has had multiple stones and ESWL/pass them/open surgery at [facility]  Past Medical History:  Diagnosis Date   Atypical mole 09/09/2014   malignant spindle cell-right temple   Basal cell carcinoma 10/16/2017   top left sholder-txpbx   BCC (basal cell carcinoma) 04/21/2017   right side-cx51fu   BCC (basal cell carcinoma) 08/30/2014   back-cx31fu   BCC (basal cell carcinoma) 07/05/1994   left temple-excision   BCC (basal cell carcinoma) 03/20/2009   right neck-excision   BCC (basal cell carcinoma) 03/20/2009   mid chest superior-cx24fu   BCC (basal cell carcinoma) 03/20/2009   mid chest inferior-cx45fu   BCC (basal cell carcinoma) 10/06/2012   right chest-cx40fu   Colon polyps    GERD (gastroesophageal reflux disease)    H/O renal calculi    Hypertension    Melanoma ([place]) 08/04/2012   L lateral torso/left flank-tx Dr.Leshin   SCC (squamous cell carcinoma) 10/16/2017   right top scalp-mohs   SCC (squamous cell carcinoma) 10/16/2017   mid abdomen-txpbx   SCC (squamous cell carcinoma) 04/21/2018   lower lip-mohs   SCC (squamous cell carcinoma) 07/27/2018   right post scalp,left jawline,left temple, right temple,right sideburn   SCC (squamous cell carcinoma) 12/28/2018   medial scalp, left post scalp   SCC (squamous cell carcinoma) 02/16/2020   left ear  rim, left inner ear,left sideburn superior,left sideburn inferior,tip of nose   SCC (squamous cell carcinoma) 01/10/2016   right forehead-mohs, right inner wrist-cx35fu   SCC (squamous cell carcinoma) 03/21/2016   left abdomen   SCC (squamous cell carcinoma) 09/24/2016   left cheek superior,inferior left cheek,inferior left cheek medial,left sideburn,right temple superior,right temple inferior   SCC (squamous cell carcinoma) 12/12/2016   left inner cheek-txpbx   SCC (squamous cell carcinoma) 04/21/2017   right temple superior-mohs,right inner cheek, left cheek superior-mohs   SCC (squamous cell carcinoma) 01/12/2013   right cheek-cx62fu,right hand-clear   SCC (squamous cell carcinoma) 03/18/2013   right jawline-txpbx   SCC (squamous cell carcinoma) 11/23/2013   left inner cheek, right scalp, right forehead,right cheek-cx108fu   SCC (squamous cell carcinoma) 01/06/2014   right temple-mohs   SCC (squamous cell carcinoma) 08/30/2014   back-cx42fu, left cheek-cx35u-excision   SCC (squamous cell carcinoma) 01/25/2015   right temple,right temple inf, left cheek-cx50fu   SCC (squamous cell carcinoma) 09/09/2015   post scalp,right forhead lateral, right temple   SCC (squamous cell carcinoma) 03/20/2009   left temple   SCC (squamous cell carcinoma) 01/14/2011   left hairline, bridge of nose   SCC (squamous cell carcinoma) 10/07/2011   under right inner eye, left cheek near temple, right cheek   SCC (squamous cell carcinoma) 10/12/2015   frontal scalp   SCCA (squamous cell carcinoma) of skin 06/27/2020   Left Hand Posterior (well diff)   SCCA (squamous cell carcinoma) of skin 06/27/2020   Right Hand Posterior (  well diff)   SCCA (squamous cell carcinoma) of skin 06/27/2020   Scalp (well diff)   SCCA (squamous cell carcinoma) of skin 06/27/2020   Right Ear Rim (well diff)   SCCA (squamous cell carcinoma) of skin 09/14/2020   well diff-mid forehead   SCCA (squamous cell carcinoma) of  skin 09/14/2020   in situ-mid parietal scalp-anterior   SCCA (squamous cell carcinoma) of skin 09/14/2020   mid parietal scalp-posterior   Skin cancer    Squamous cell carcinoma of skin 10/16/2017   left top scalp-txpbx   Past Surgical History:  Procedure Laterality Date   BILIARY STENT PLACEMENT N/A 05/20/2013   Procedure: BILIARY STENT PLACEMENT;  Surgeon: Daneil Dolin, MD;  Location: [facility];  Service: Gastroenterology;  Laterality: N/A;   CHOLECYSTECTOMY N/A 7/74/1287   complicated by cystic duct stump leak and gb fossa abscess. Procedure: LAPAROSCOPIC CHOLECYSTECTOMY;  Surgeon: Jamesetta So, MD;  Location: [facility];  Service: General;  Laterality: N/A;   COLONOSCOPY  2004   Dr. Laural Golden: few scattered diverticula at sigmoid and transverse colon, polyps. path unknown.    COLONOSCOPY  July 2004   Dr. Gala Romney. Few scattered diverticula sigmoid and transverse colon, 2 small rectal polyps (path unavailable)   COLONOSCOPY N/A 09/21/2013   Procedure: COLONOSCOPY;  Surgeon: Daneil Dolin, MD;  Location: [facility];  Service: Endoscopy;  Laterality: N/A;  10:30 AM   CYSTOSCOPY     ERCP N/A 05/20/2013   RMR: cystic duct stump leak s/p cholecystectomy. +choledocholithiasis, s/p sphinterotomy, stent placement, stone extraction   ERCP N/A 07/22/2013   RMR: S/P removal of biliary stent along with removal of common duct stones as described above, status post sphincterotomy balloon dilation and balloon dredging of the biliary tree Previously noted biliary leak sealed/Duodenal diverticulum   ESOPHAGOGASTRODUODENOSCOPY N/A 06/08/2013   [place] esophageal erosions in the distal one half of the esophagus. 2 cm segment of markedly inflamed and swollen duodenal mucosa at the junction of the bulb and second portion. Significant encroachment on the lumen causing GOO. Bx: benign  Surgeon: Daneil Dolin, MD;  [facility];  Service: Endoscopy;  Laterality: N/A;  WITH PHENERGAN 12.5 mg IV on call     EXTRACORPOREAL SHOCK WAVE LITHOTRIPSY     EYE SURGERY     EYE SURGERY Left 35 years ago   KIDNEY STONE SURGERY     MELANOMA EXCISION Left 08/13   [facility]   REMOVAL OF STONES N/A 07/22/2013   Procedure: REMOVAL OF common bile duct STONES;  Surgeon: Daneil Dolin, MD;  Location: [facility];  Service: Endoscopy;  Laterality: N/A;   SPHINCTEROTOMY N/A 05/20/2013   Procedure: SPHINCTEROTOMY;  Surgeon: Daneil Dolin, MD;  Location: [facility];  Service: Gastroenterology;  Laterality: N/A;   SPHINCTEROTOMY N/A 07/22/2013   Procedure: SPHINCTEROTOMY with dilation;  Surgeon: Daneil Dolin, MD;  Location: [facility];  Service: Endoscopy;  Laterality: N/A;   STENT REMOVAL N/A 07/22/2013   Procedure: Biliary STENT REMOVAL;  Surgeon: Daneil Dolin, MD;  Location: [facility];  Service: Endoscopy;  Laterality: N/A;    Medications: I have reviewed the patient's current medications. Allergies: No Known Allergies  Family History  Problem Relation Age of Onset   Cancer Father    Cancer Brother    Colon cancer Neg Hx    Social History:  reports that he has been smoking. He has a 25.00 pack-year smoking history. He has never used smokeless tobacco. He reports that he  does not drink alcohol and does not use drugs.  ROS: All systems are reviewed and negative except as noted. Rest negative  Physical Exam:  Vital signs in last 24 hours: Temp:  [97.8 F (36.6 C)-99 F (37.2 C)] 98.2 F (36.8 C) (07/08 0844) Pulse Rate:  [86-130] 91 (07/08 0844) Resp:  [18-38] 20 (07/08 0844) BP: (90-134)/(43-113) 122/63 (07/08 0844) SpO2:  [90 %-95 %] 93 % (07/08 0844) Weight:  [113.4 kg] 113.4 kg (07/07 1145)  Cardiovascular: Skin warm; not flushed Respiratory: Breaths quiet; no shortness of breath Abdomen: No masses Neurological: Normal sensation to touch Musculoskeletal: Normal motor function arms and legs Lymphatics: No inguinal adenopathy Skin: No rashes Genitourinary:no distress  Laboratory Data:  Results for orders  placed or performed during the hospital encounter of 06/28/21 (from the past 72 hour(s))  Urinalysis, Routine w reflex microscopic Urine, Clean Catch     Status: Abnormal   Collection Time: 06/28/21 12:33 PM  Result Value Ref Range   Color, Urine YELLOW YELLOW   APPearance TURBID (A) CLEAR   Specific Gravity, Urine 1.018 1.005 - 1.030   pH 5.0 5.0 - 8.0   Glucose, UA NEGATIVE NEGATIVE mg/dL   Hgb urine dipstick MODERATE (A) NEGATIVE   Bilirubin Urine NEGATIVE NEGATIVE   Ketones, ur 5 (A) NEGATIVE mg/dL   Protein, ur 100 (A) NEGATIVE mg/dL   Nitrite NEGATIVE NEGATIVE   Leukocytes,Ua LARGE (A) NEGATIVE   RBC / HPF >50 (H) 0 - 5 RBC/hpf   WBC, UA >50 (H) 0 - 5 WBC/hpf   Bacteria, UA NONE SEEN NONE SEEN   Squamous Epithelial / LPF 0-5 0 - 5   WBC Clumps PRESENT     Comment: Performed at [facility], [address], [place], [place] [address]  Comprehensive metabolic panel     Status: Abnormal   Collection Time: 06/28/21 12:47 PM  Result Value Ref Range   Sodium 137 135 - 145 mmol/L   Potassium 3.8 3.5 - 5.1 mmol/L   Chloride 102 98 - 111 mmol/L   CO2 26 22 - 32 mmol/L   Glucose, Bld 90 70 - 99 mg/dL    Comment: Glucose reference range applies only to samples taken after fasting for at least 8 hours.   BUN 35 (H) 8 - 23 mg/dL   Creatinine, Ser 2.25 (H) 0.61 - 1.24 mg/dL   Calcium 8.3 (L) 8.9 - 10.3 mg/dL   Total Protein 6.5 6.5 - 8.1 g/dL   Albumin 3.2 (L) 3.5 - 5.0 g/dL   AST 31 15 - 41 U/L   ALT <5 0 - 44 U/L   Alkaline Phosphatase 116 38 - 126 U/L   Total Bilirubin 1.8 (H) 0.3 - 1.2 mg/dL   GFR, Estimated 30 (L) >60 mL/min    Comment: (NOTE) Calculated using the CKD-EPI Creatinine Equation (2021)    Anion gap 9 5 - 15    Comment: Performed at [facility], [address], [place], [place] [address]  CBC with Differential     Status: Abnormal   Collection Time: 06/28/21 12:47 PM  Result Value Ref Range   WBC 7.1 4.0 - 10.5 K/uL   RBC 3.49 (L) 4.22 - 5.81 MIL/uL    Hemoglobin 12.5 (L) 13.0 - 17.0 g/dL   HCT 36.6 (L) 39.0 - 52.0 %   MCV 104.9 (H) 80.0 - 100.0 fL   MCH 35.8 (H) 26.0 - 34.0 pg   MCHC 34.2 30.0 - 36.0 g/dL   RDW 23.4 (H)  11.5 - 15.5 %   Platelets 229 150 - 400 K/uL   nRBC 1.7 (H) 0.0 - 0.2 %   Neutrophils Relative % 96 %   Neutro Abs 6.9 1.7 - 7.7 K/uL   Lymphocytes Relative 2 %   Lymphs Abs 0.1 (L) 0.7 - 4.0 K/uL   Monocytes Relative 2 %   Monocytes Absolute 0.1 0.1 - 1.0 K/uL   Eosinophils Relative 0 %   Eosinophils Absolute 0.0 0.0 - 0.5 K/uL   Basophils Relative 0 %   Basophils Absolute 0.0 0.0 - 0.1 K/uL   Immature Granulocytes 0 %   Abs Immature Granulocytes 0.03 0.00 - 0.07 K/uL    Comment: Performed at [facility], [address] [place], [place] [address]  CK     Status: None   Collection Time: 06/28/21 12:47 PM  Result Value Ref Range   Total CK 156 49 - 397 U/L    Comment: Performed at [facility], [address], [place], [place] [address]  Magnesium     Status: Abnormal   Collection Time: 06/28/21 12:47 PM  Result Value Ref Range   Magnesium 1.4 (L) 1.7 - 2.4 mg/dL    Comment: Performed at [facility], [address] [place], [place] [address]  Ethanol     Status: None   Collection Time: 06/28/21 12:48 PM  Result Value Ref Range   Alcohol, Ethyl (B) <10 <10 mg/dL    Comment: (NOTE) Lowest detectable limit for serum alcohol is 10 mg/dL.  For medical purposes only. Performed at [facility], [address], [place], [place] [address]   D-dimer, quantitative     Status: Abnormal   Collection Time: 06/28/21  8:21 PM  Result Value Ref Range   D-Dimer, Quant 6.84 (H) 0.00 - 0.50 ug/mL-FEU    Comment: (NOTE) At the manufacturer cut-off value of 0.5 g/mL FEU, this assay has a negative predictive value of 95-100%.This assay is intended for use in conjunction with a clinical pretest probability (PTP) assessment model to exclude pulmonary embolism (PE) and deep venous thrombosis (DVT) in outpatients suspected  of PE or DVT. Results should be correlated with clinical presentation. Performed at [facility], [address] [place], [place] [address]   Brain natriuretic peptide     Status: Abnormal   Collection Time: 06/28/21  8:21 PM  Result Value Ref Range   B Natriuretic Peptide 207.0 (H) 0.0 - 100.0 pg/mL    Comment: Performed at [facility], [address], [place], [place] [address]  Heparin level (unfractionated)     Status: None   Collection Time: 06/29/21  3:39 AM  Result Value Ref Range   Heparin Unfractionated 0.36 0.30 - 0.70 IU/mL    Comment: (NOTE) The clinical reportable range upper limit is being lowered to >1.10 to align with the FDA approved guidance for the current laboratory assay.  If heparin results are below expected values, and patient dosage has  been confirmed, suggest follow up testing of antithrombin III levels. Performed at [facility], [address] [place], [place] [address]   CBC     Status: Abnormal   Collection Time: 06/29/21  3:39 AM  Result Value Ref Range   WBC 19.5 (H) 4.0 - 10.5 K/uL   RBC 3.18 (L) 4.22 - 5.81 MIL/uL   Hemoglobin 11.2 (L) 13.0 - 17.0 g/dL   HCT 33.6 (L) 39.0 - 52.0 %   MCV 105.7 (H) 80.0 - 100.0 fL   MCH 35.2 (H) 26.0 -  34.0 pg   MCHC 33.3 30.0 - 36.0 g/dL   RDW 23.8 (H) 11.5 - 15.5 %   Platelets 215 150 - 400 K/uL   nRBC 0.0 0.0 - 0.2 %    Comment: Performed at [facility], [address], [place], [place] [address]  Comprehensive metabolic panel     Status: Abnormal   Collection Time: 06/29/21  3:39 AM  Result Value Ref Range   Sodium 137 135 - 145 mmol/L   Potassium 4.3 3.5 - 5.1 mmol/L   Chloride 101 98 - 111 mmol/L   CO2 24 22 - 32 mmol/L   Glucose, Bld 75 70 - 99 mg/dL    Comment: Glucose reference range applies only to samples taken after fasting for at least 8 hours.   BUN 47 (H) 8 - 23 mg/dL   Creatinine, Ser 2.73 (H) 0.61 - 1.24 mg/dL   Calcium 8.0 (L) 8.9 - 10.3 mg/dL   Total Protein 6.1 (L) 6.5 - 8.1 g/dL    Albumin 3.2 (L) 3.5 - 5.0 g/dL   AST 27 15 - 41 U/L   ALT 22 0 - 44 U/L   Alkaline Phosphatase 57 38 - 126 U/L   Total Bilirubin 1.3 (H) 0.3 - 1.2 mg/dL   GFR, Estimated 24 (L) >60 mL/min    Comment: (NOTE) Calculated using the CKD-EPI Creatinine Equation (2021)    Anion gap 12 5 - 15    Comment: Performed at [facility], [address] [place],  [address]   No results found for this or any previous visit (from the past 240 hour(s)). Creatinine: Recent Labs    06/28/21 1247 06/29/21 0339  CREATININE 2.25* 2.73*    Xrays: See report/chart   As noted  Impression/Assessment:   Patient has left ureteral stone.  He has worsening his serum creatinine. Mild left flank pain  Plan:  Picture drawn: stent and retrograde and fuop with Dr Noah Delaine locally; pros and cons and sequalae disussed  Linell Shawn A Salar Molden 06/29/2021, 11:08 AM

## 2021-06-29 NOTE — Progress Notes (Signed)
RT called to room to assess pt's breathing. Pt's breathing was very labored upon arrival to room. Pt was placed on BIPAP and given PRN neb treatment. Pt's breathing slowed down after being placed on BIPAP.

## 2021-06-29 NOTE — Transfer of Care (Signed)
Immediate Anesthesia Transfer of Care Note  Patient: Sergio Schroeder  Procedure(s) Performed: CYSTOSCOPY WITH RETROGRADE PYELOGRAM/URETERAL STENT PLACEMENT (Left)  Patient Location: PACU  Anesthesia Type:MAC  Level of Consciousness: awake, alert , oriented and patient cooperative  Airway & Oxygen Therapy: Patient Spontanous Breathing and Patient connected to face mask oxygen  Post-op Assessment: Report given to RN, Post -op Vital signs reviewed and stable and Patient moving all extremities  Post vital signs: Reviewed and stable  Last Vitals:  Vitals Value Taken Time  BP    Temp    Pulse 102 06/29/21 1628  Resp 25 06/29/21 1628  SpO2 97 % 06/29/21 1628  Vitals shown include unvalidated device data.  Last Pain:  Vitals:   06/29/21 1451  TempSrc: Oral  PainSc: 2       Patients Stated Pain Goal: 8 (53/61/44 3154)  Complications: No notable events documented.

## 2021-06-29 NOTE — Progress Notes (Signed)
  Echocardiogram 2D Echocardiogram has been performed.  Sergio Schroeder 06/29/2021, 11:05 AM

## 2021-06-29 NOTE — Progress Notes (Addendum)
Patient seen in room postprocedure.  He was noted that he was having shortness of breath and wheezing during procedure and was transferred to ICU for observation.  On my arrival, patient is awake, sitting up in bed.  He has increased work of breathing.  Bilateral wheezing with some rhonchi.  He is noted to be tachycardic and hypertensive.  He does have some lower extremity edema.  Condom catheter in place with blood-tinged urine.  Stat chest x-ray has been ordered.  We will also order ABG.  Discussed with respiratory therapy, will give him a nebulizer treatment right now.  We will also order intravenous steroids.  He is currently on 6 to 8 L of oxygen and still appears to be tachypneic.  Will place on BiPAP.  Updated patient's son over the phone  [facility]

## 2021-06-29 NOTE — Interval H&P Note (Signed)
History and Physical Interval Note:  06/29/2021 2:33 PM  Sergio Schroeder  has presented today for surgery, with the diagnosis of Left Renal calculi.  The various methods of treatment have been discussed with the patient and family. After consideration of risks, benefits and other options for treatment, the patient has consented to  Procedure(s): CYSTOSCOPY WITH RETROGRADE PYELOGRAM/URETERAL STENT PLACEMENT (Left) as a surgical intervention.  The patient's history has been reviewed, patient examined, no change in status, stable for surgery.  I have reviewed the patient's chart and labs.  Questions were answered to the patient's satisfaction.     [name] A Taiwana Willison

## 2021-06-29 NOTE — Progress Notes (Signed)
[place] for heparin Indication: pulmonary embolus  No Known Allergies [phone] Patient Measurements: Height: 5\' 9"  (175.3 cm) Weight: 113.4 kg (250 lb) IBW/kg (Calculated) : 70.7 Heparin Dosing Weight: 95kg  Vital Signs: Temp: 98.1 F (36.7 C) (07/08 0031) Temp Source: Oral (07/08 0031) BP: 96/61 (07/08 0031) Pulse Rate: 86 (07/08 0031)  Labs: Recent Labs    06/28/21 1247 06/29/21 0339  HGB 12.5*  --   HCT 36.6*  --   PLT 229  --   HEPARINUNFRC  --  0.36  CREATININE 2.25* 2.73*  CKTOTAL 156  --      Estimated Creatinine Clearance: 29.9 mL/min (A) (by C-G formula based on SCr of 2.73 mg/dL (H)).   Medical History: Past Medical History:  Diagnosis Date   Atypical mole 09/09/2014   malignant spindle cell-right temple   Basal cell carcinoma 10/16/2017   top left sholder-txpbx   BCC (basal cell carcinoma) 04/21/2017   right side-cx42fu   BCC (basal cell carcinoma) 08/30/2014   back-cx54fu   BCC (basal cell carcinoma) 07/05/1994   left temple-excision   BCC (basal cell carcinoma) 03/20/2009   right neck-excision   BCC (basal cell carcinoma) 03/20/2009   mid chest superior-cx74fu   BCC (basal cell carcinoma) 03/20/2009   mid chest inferior-cx6fu   BCC (basal cell carcinoma) 10/06/2012   right chest-cx18fu   Colon polyps    GERD (gastroesophageal reflux disease)    H/O renal calculi    Hypertension    Melanoma ([place]) 08/04/2012   L lateral torso/left flank-tx Dr.Leshin   SCC (squamous cell carcinoma) 10/16/2017   right top scalp-mohs   SCC (squamous cell carcinoma) 10/16/2017   mid abdomen-txpbx   SCC (squamous cell carcinoma) 04/21/2018   lower lip-mohs   SCC (squamous cell carcinoma) 07/27/2018   right post scalp,left jawline,left temple, right temple,right sideburn   SCC (squamous cell carcinoma) 12/28/2018   medial scalp, left post scalp   SCC (squamous cell carcinoma) 02/16/2020   left ear rim, left  inner ear,left sideburn superior,left sideburn inferior,tip of nose   SCC (squamous cell carcinoma) 01/10/2016   right forehead-mohs, right inner wrist-cx36fu   SCC (squamous cell carcinoma) 03/21/2016   left abdomen   SCC (squamous cell carcinoma) 09/24/2016   left cheek superior,inferior left cheek,inferior left cheek medial,left sideburn,right temple superior,right temple inferior   SCC (squamous cell carcinoma) 12/12/2016   left inner cheek-txpbx   SCC (squamous cell carcinoma) 04/21/2017   right temple superior-mohs,right inner cheek, left cheek superior-mohs   SCC (squamous cell carcinoma) 01/12/2013   right cheek-cx36fu,right hand-clear   SCC (squamous cell carcinoma) 03/18/2013   right jawline-txpbx   SCC (squamous cell carcinoma) 11/23/2013   left inner cheek, right scalp, right forehead,right cheek-cx70fu   SCC (squamous cell carcinoma) 01/06/2014   right temple-mohs   SCC (squamous cell carcinoma) 08/30/2014   back-cx91fu, left cheek-cx35u-excision   SCC (squamous cell carcinoma) 01/25/2015   right temple,right temple inf, left cheek-cx55fu   SCC (squamous cell carcinoma) 09/09/2015   post scalp,right forhead lateral, right temple   SCC (squamous cell carcinoma) 03/20/2009   left temple   SCC (squamous cell carcinoma) 01/14/2011   left hairline, bridge of nose   SCC (squamous cell carcinoma) 10/07/2011   under right inner eye, left cheek near temple, right cheek   SCC (squamous cell carcinoma) 10/12/2015   frontal scalp   SCCA (squamous cell carcinoma) of skin 06/27/2020   Left Hand Posterior (well diff)   SCCA (squamous  cell carcinoma) of skin 06/27/2020   Right Hand Posterior (well diff)   SCCA (squamous cell carcinoma) of skin 06/27/2020   Scalp (well diff)   SCCA (squamous cell carcinoma) of skin 06/27/2020   Right Ear Rim (well diff)   SCCA (squamous cell carcinoma) of skin 09/14/2020   well diff-mid forehead   SCCA (squamous cell carcinoma) of skin  09/14/2020   in situ-mid parietal scalp-anterior   SCCA (squamous cell carcinoma) of skin 09/14/2020   mid parietal scalp-posterior   Skin cancer    Squamous cell carcinoma of skin 10/16/2017   left top scalp-txpbx    Assessment: 69 yoM admitted after fall with SOB, concern for PE. Cannot perform CT chest or VQ scan at this time so pharmacy asked to start heparin empirically. CT head negative, no AC PTA, CBC wnl. D-dimer 6.84.   Heparin level of 0.36 is therapeutic on heparin 1500 units/hr.   Goal of Therapy:  Heparin level 0.3-0.7 units/ml Monitor platelets by anticoagulation protocol: Yes   Plan:  Continue heparin 1500 units/hr  Check 8 hr confirmatory heparin level  Monitor heparin level, CBC and s/s of bleeding daily    06/29/2021

## 2021-06-29 NOTE — Anesthesia Preprocedure Evaluation (Addendum)
Anesthesia Evaluation  Patient identified by MRN, date of birth, ID band Patient awake    Reviewed: Allergy & Precautions, NPO status , Patient's Chart, lab work & pertinent test results  Airway Mallampati: III  TM Distance: >3 FB Neck ROM: Full    Dental  (+) Dental Advisory Given, Missing   Pulmonary Current Smoker,     + decreased breath sounds+ wheezing      Cardiovascular Exercise Tolerance: Poor hypertension, Pt. on medications  Rhythm:Irregular Rate:Tachycardia  29-Jun-2021 12:44:56 [place] [id number] ROUTINE RECORD 02-28-1946 (26 yr) Male Caucasian Room:A313 Loc:[id number] Technician: [phone] Test ind: Vent. rate 91 BPM PR interval 212 ms QRS duration 130 ms QT/QTcB 394/484 ms P-R-T axes 62 -68 80 Sinus rhythm with 1st degree A-V block with occasional Premature ventricular complexes Left axis deviation Non-specific intra-ventricular conduction block Minimal voltage criteria for LVH, may be normal variant ( R in aVL ) Inferior infarct , age undetermined Anterolateral infarct , age undetermined Abnormal ECG   Neuro/Psych negative neurological ROS  negative psych ROS   GI/Hepatic GERD  Controlled,  Endo/Other  negative endocrine ROS  Renal/GU Renal Insufficiency and CRFRenal disease (aki)     Musculoskeletal   Abdominal   Peds  Hematology  (+) anemia ,   Anesthesia Other Findings Multiple basal/squamous cell carcinoma resections   Reproductive/Obstetrics                          Anesthesia Physical Anesthesia Plan  ASA: 4 and emergent  Anesthesia Plan: General   Post-op Pain Management:    Induction: Intravenous  PONV Risk Score and Plan: 3 and Ondansetron, Dexamethasone and Metaclopromide  Airway Management Planned: Nasal Cannula, Natural Airway and Simple Face Mask  Additional Equipment:   Intra-op Plan:   Post-operative Plan:   Informed Consent: I have  reviewed the patients History and Physical, chart, labs and discussed the procedure including the risks, benefits and alternatives for the proposed anesthesia with the patient or authorized representative who has indicated his/her understanding and acceptance.     Dental advisory given  Plan Discussed with: CRNA and Surgeon  Anesthesia Plan Comments:         Anesthesia Quick Evaluation

## 2021-06-29 NOTE — Anesthesia Postprocedure Evaluation (Signed)
Anesthesia Post Note  Patient: Sergio Schroeder  Procedure(s) Performed: CYSTOSCOPY WITH RETROGRADE PYELOGRAM/URETERAL STENT PLACEMENT (Left)  Patient location during evaluation: PACU Anesthesia Type: General Level of consciousness: awake and alert and oriented Pain management: pain level controlled Vital Signs Assessment: post-procedure vital signs reviewed and stable Respiratory status: spontaneous breathing, respiratory function stable and patient connected to face mask oxygen Cardiovascular status: blood pressure returned to baseline and stable Postop Assessment: no apparent nausea or vomiting Anesthetic complications: no   No notable events documented.   Last Vitals:  Vitals:   06/29/21 1734 06/29/21 1736  BP:  (!) 195/70  Pulse: (!) 134 (!) 129  Resp: (!) 25 (!) 30  Temp:    SpO2: (!) 86% 93%    Last Pain:  Vitals:   06/29/21 1630  TempSrc:   PainSc: 0-No pain                 Fleming Prill C Marchell Froman

## 2021-06-29 NOTE — Progress Notes (Signed)
Took patient off Bipap and placed on [place] HFNC at 7L.  Patient's sat is at 97%.  Bipap is still at bedside if patient needs it.  Will continue to monitor.

## 2021-06-29 NOTE — Progress Notes (Signed)
[facility] night shift.  The nursing staff reported that the patient has for diarrhea episodes since arriving from the emergency department and would like something to help him with those symptoms.  Bismuth salicylate 30 mL every 4 hours for diarrhea or loose stools x2 doses ordered.  Tennis Must, MD

## 2021-06-30 LAB — CBC
HCT: 32.2 % — ABNORMAL LOW (ref 39.0–52.0)
Hemoglobin: 10.8 g/dL — ABNORMAL LOW (ref 13.0–17.0)
MCH: 34.4 pg — ABNORMAL HIGH (ref 26.0–34.0)
MCHC: 33.5 g/dL (ref 30.0–36.0)
MCV: 102.5 fL — ABNORMAL HIGH (ref 80.0–100.0)
Platelets: 168 10*3/uL (ref 150–400)
RBC: 3.14 MIL/uL — ABNORMAL LOW (ref 4.22–5.81)
RDW: 23.6 % — ABNORMAL HIGH (ref 11.5–15.5)
WBC: 13.5 10*3/uL — ABNORMAL HIGH (ref 4.0–10.5)
nRBC: 0.1 % (ref 0.0–0.2)

## 2021-06-30 LAB — BASIC METABOLIC PANEL
Anion gap: 9 (ref 5–15)
BUN: 60 mg/dL — ABNORMAL HIGH (ref 8–23)
CO2: 22 mmol/L (ref 22–32)
Calcium: 7.4 mg/dL — ABNORMAL LOW (ref 8.9–10.3)
Chloride: 105 mmol/L (ref 98–111)
Creatinine, Ser: 1.93 mg/dL — ABNORMAL HIGH (ref 0.61–1.24)
GFR, Estimated: 36 mL/min — ABNORMAL LOW (ref >60)
Glucose, Bld: 142 mg/dL — ABNORMAL HIGH (ref 70–99)
Potassium: 4 mmol/L (ref 3.5–5.1)
Sodium: 136 mmol/L (ref 135–145)

## 2021-06-30 MED ORDER — PREDNISONE 20 MG PO TABS
40.0000 mg | ORAL_TABLET | Freq: Every day | ORAL | Status: DC
  Administered 2021-07-01: 40 mg via ORAL
  Filled 2021-06-30: qty 2

## 2021-06-30 MED ORDER — LACTATED RINGERS IV SOLN: INTRAVENOUS | Status: DC

## 2021-06-30 MED ORDER — HEPARIN SODIUM (PORCINE) 5000 UNIT/ML IJ SOLN
5000.0000 [IU] | Freq: Three times a day (TID) | INTRAMUSCULAR | Status: DC
  Administered 2021-06-30 – 2021-07-02 (×7): 5000 [IU] via SUBCUTANEOUS
  Filled 2021-06-30 (×7): qty 1

## 2021-06-30 MED ORDER — ORAL CARE MOUTH RINSE
15.0000 mL | Freq: Two times a day (BID) | OROMUCOSAL | Status: DC
  Administered 2021-06-30 – 2021-07-02 (×4): 15 mL via OROMUCOSAL

## 2021-06-30 NOTE — Progress Notes (Signed)
Discontinued C diff sample order and enteric precautions due to patient has had no bowel movement in over 24 hours and order has expired for sample. Patient did not have any bowel movements today per patient. Dr Roderic Palau was made aware.

## 2021-06-30 NOTE — Progress Notes (Signed)
PROGRESS NOTE    Sergio Schroeder  QIW:[id number] DOB: [birth year] DOA: 06/28/2021 PCP: [name] [facility]    Brief Narrative:  75 y/o male with history of HTN, tobacco, had a fall prior to admission while he was bent over working on his car. On arrival to ED, noted to have AKI, tachycardia and low BP. Further imaging indicated left hydronephrosis with UPJ stone. He is chronically short of breath and likely has some degree of COPD. Urology consulted for UPJ stone.    Assessment & Plan:   Active Problems:   AKI (acute kidney injury) ([place])   HTN (hypertension)   Tobacco abuse   Fall   Generalized weakness   Acute kidney injury -patient had decreased po intake and was also taking ACE/HCTZ -he is also noted to have a left UPJ stone with obstruction, but had stent placed on 7/8 -would continue on IV fluids -creatinine currently trending down 2.2->2.7->1.9 -BUN elevated, likely due to steroids -Continue to follow  Left hydronephrosis with UPJ stone -urology consulted -start on ceftriaxone to cover any element of infection -check urine culture -Status post placement of left ureteral stent  Diarrhea -Patient was having multiple stools shortly after admission -No further stools since yesterday -check stool studies -use imodium if C diff negative  Probable COPD exacerbation -long history of tobacco use -Improving on steroids and bronchodilators -We will transition steroids to prednisone taper  Tachycardia - overall heart seems to have improved with IV fluids -echo with normal EF BNP mildly elevated -D dimer elevated at 6.84, VQ scan negative, suspect related to renal process -Overall heart rate has normalized  Fall/Generalized weakness -patient had fall prior to admission while he was bent over trying to adjust his license plate -he did report hitting his head, no LOC, CT head negative -also suffered abrasions to his knees and forehead -seen by PT with no further PT  recommended.   HTN -Blood pressure currently stable -continue to hold ACE/HCTZ for now   DVT prophylaxis: heparin  Code Status: DNR Family Communication: Updated patient's son on 7/8 Disposition Plan: Status is: Inpatient  The patient will require care spanning > 2 midnights and should be moved to inpatient because: Ongoing diagnostic testing needed not appropriate for outpatient work up, IV treatments appropriate due to intensity of illness or inability to take PO, and Inpatient level of care appropriate due to severity of illness  Dispo: The patient is from: Home              Anticipated d/c is to: Home              Patient currently is not medically stable to d/c.   Difficult to place patient No     Consultants:  urology  Procedures:  7/8 left ureteral stent placement  Antimicrobials:  Ceftriaxone 7/8>    Subjective: Yesterday post procedure, patient became increasingly short of breath and was wheezing.  Required transfer to ICU and was placed on BiPAP for COPD exacerbation.  Today, he feels significantly improved.  He has been off BiPAP since last night.  Wheezing has resolved.  Objective: Vitals:   06/30/21 0800 06/30/21 0900 06/30/21 1000 06/30/21 1100  BP: 124/70 132/73 (!) 145/83 (!) 145/84  Pulse: 64 70 64 64  Resp: 20 16 14 14   Temp:   97.9 F (36.6 C)   TempSrc:   Oral   SpO2: 95% 94% 96% 97%  Weight:      Height:  Intake/Output Summary (Last 24 hours) at 06/30/2021 1113 Last data filed at 06/30/2021 0957 Gross per 24 hour  Intake 3573.72 ml  Output 1025 ml  Net 2548.72 ml   Filed Weights   06/28/21 1145  Weight: 113.4 kg    Examination:  General exam: Alert, awake, oriented x 3 Respiratory system: Clear to auscultation. Respiratory effort normal. Cardiovascular system:RRR. No murmurs, rubs, gallops. Gastrointestinal system: Abdomen is nondistended, soft and nontender. No organomegaly or masses felt. Normal bowel sounds heard. Central  nervous system: Alert and oriented. No focal neurological deficits. Extremities: 1+ edema b/l Skin: No rashes, lesions or ulcers Psychiatry: Judgement and insight appear normal. Mood & affect appropriate.      Data Reviewed: I have personally reviewed following labs and imaging studies  CBC: Recent Labs  Lab 06/28/21 1247 06/29/21 0339 06/30/21 0553  WBC 7.1 19.5* 13.5*  NEUTROABS 6.9  --   --   HGB 12.5* 11.2* 10.8*  HCT 36.6* 33.6* 32.2*  MCV 104.9* 105.7* 102.5*  PLT 229 215 242   Basic Metabolic Panel: Recent Labs  Lab 06/28/21 1247 06/29/21 0339 06/30/21 0942  NA 137 137 136  K 3.8 4.3 4.0  CL 102 101 105  CO2 26 24 22   GLUCOSE 90 75 142*  BUN 35* 47* 60*  CREATININE 2.25* 2.73* 1.93*  CALCIUM 8.3* 8.0* 7.4*  MG 1.4*  --   --    GFR: Estimated Creatinine Clearance: 42.3 mL/min (A) (by C-G formula based on SCr of 1.93 mg/dL (H)). Liver Function Tests: Recent Labs  Lab 06/28/21 1247 06/29/21 0339  AST 31 27  ALT <5 22  ALKPHOS 116 57  BILITOT 1.8* 1.3*  PROT 6.5 6.1*  ALBUMIN 3.2* 3.2*   No results for input(s): LIPASE, AMYLASE in the last 168 hours. No results for input(s): AMMONIA in the last 168 hours. Coagulation Profile: No results for input(s): INR, PROTIME in the last 168 hours. Cardiac Enzymes: Recent Labs  Lab 06/28/21 1247  CKTOTAL 156   BNP (last 3 results) No results for input(s): PROBNP in the last 8760 hours. HbA1C: No results for input(s): HGBA1C in the last 72 hours. CBG: No results for input(s): GLUCAP in the last 168 hours. Lipid Profile: No results for input(s): CHOL, HDL, LDLCALC, TRIG, CHOLHDL, LDLDIRECT in the last 72 hours. Thyroid Function Tests: No results for input(s): TSH, T4TOTAL, FREET4, T3FREE, THYROIDAB in the last 72 hours. Anemia Panel: No results for input(s): VITAMINB12, FOLATE, FERRITIN, TIBC, IRON, RETICCTPCT in the last 72 hours. Sepsis Labs: No results for input(s): PROCALCITON, LATICACIDVEN in the  last 168 hours.  Recent Results (from the past 240 hour(s))  SARS CORONAVIRUS 2 (TAT 6-24 HRS) Nasopharyngeal Nasopharyngeal Swab     Status: None   Collection Time: 06/28/21  8:31 PM   Specimen: Nasopharyngeal Swab  Result Value Ref Range Status   SARS Coronavirus 2 NEGATIVE NEGATIVE Final    Comment: (NOTE) SARS-CoV-2 target nucleic acids are NOT DETECTED.  The SARS-CoV-2 RNA is generally detectable in upper and lower respiratory specimens during the acute phase of infection. Negative results do not preclude SARS-CoV-2 infection, do not rule out co-infections with other pathogens, and should not be used as the sole basis for treatment or other patient management decisions. Negative results must be combined with clinical observations, patient history, and epidemiological information. The expected result is Negative.  Fact Sheet for Patients: SugarRoll.be  Fact Sheet for Healthcare Providers: https://www.woods-mathews.com/  This test is not yet approved or cleared by  the [employer] and  has been authorized for detection and/or diagnosis of SARS-CoV-2 by FDA under an Emergency Use Authorization (EUA). This EUA will remain  in effect (meaning this test can be used) for the duration of the COVID-19 declaration under Se ction 564(b)(1) of the Act, 21 U.S.C. section 360bbb-3(b)(1), unless the authorization is terminated or revoked sooner.  Performed at [facility], [place] [address] [place], [place] [address]   Resp Panel by RT-PCR (Flu A&B, Covid) Nasopharyngeal Swab     Status: None   Collection Time: 06/29/21 12:24 PM   Specimen: Nasopharyngeal Swab; Nasopharyngeal(NP) swabs in vial transport medium  Result Value Ref Range Status   SARS Coronavirus 2 by RT PCR NEGATIVE NEGATIVE Final    Comment: (NOTE) SARS-CoV-2 target nucleic acids are NOT DETECTED.  The SARS-CoV-2 RNA is generally detectable in upper  respiratory specimens during the acute phase of infection. The lowest concentration of SARS-CoV-2 viral copies this assay can detect is 138 copies/mL. A negative result does not preclude SARS-Cov-2 infection and should not be used as the sole basis for treatment or other patient management decisions. A negative result may occur with  improper specimen collection/handling, submission of specimen other than nasopharyngeal swab, presence of viral mutation(s) within the areas targeted by this assay, and inadequate number of viral copies(<138 copies/mL). A negative result must be combined with clinical observations, patient history, and epidemiological information. The expected result is Negative.  Fact Sheet for Patients:  EntrepreneurPulse.com.au  Fact Sheet for Healthcare Providers:  IncredibleEmployment.be  This test is no t yet approved or cleared by the [place] FDA and  has been authorized for detection and/or diagnosis of SARS-CoV-2 by FDA under an Emergency Use Authorization (EUA). This EUA will remain  in effect (meaning this test can be used) for the duration of the COVID-19 declaration under Section 564(b)(1) of the Act, 21 U.S.C.section 360bbb-3(b)(1), unless the authorization is terminated  or revoked sooner.       Influenza A by PCR NEGATIVE NEGATIVE Final   Influenza B by PCR NEGATIVE NEGATIVE Final    Comment: (NOTE) The Xpert Xpress SARS-CoV-2/FLU/RSV plus assay is intended as an aid in the diagnosis of influenza from Nasopharyngeal swab specimens and should not be used as a sole basis for treatment. Nasal washings and aspirates are unacceptable for Xpert Xpress SARS-CoV-2/FLU/RSV testing.  Fact Sheet for Patients: EntrepreneurPulse.com.au  Fact Sheet for Healthcare Providers: IncredibleEmployment.be  This test is not yet approved or cleared by the [place] FDA and has been  authorized for detection and/or diagnosis of SARS-CoV-2 by FDA under an Emergency Use Authorization (EUA). This EUA will remain in effect (meaning this test can be used) for the duration of the COVID-19 declaration under Section 564(b)(1) of the Act, 21 U.S.C. section 360bbb-3(b)(1), unless the authorization is terminated or revoked.  Performed at [facility], [place] [address], [place], [place] [address]   MRSA Next Gen by PCR, Nasal     Status: None   Collection Time: 06/29/21  9:00 PM   Specimen: Nasal Mucosa; Nasal Swab  Result Value Ref Range Status   MRSA by PCR Next Gen NOT DETECTED NOT DETECTED Final    Comment: (NOTE) The GeneXpert MRSA Assay (FDA approved for NASAL specimens only), is one component of a comprehensive MRSA colonization surveillance program. It is not intended to diagnose MRSA infection nor to guide or monitor treatment for MRSA infections. Test performance is not FDA approved in patients less  than 45 years old. Performed at [facility], [address] [place], [place] [address]          Radiology Studies: CT ABDOMEN PELVIS WO CONTRAST  Result Date: 06/28/2021 CLINICAL DATA:  Decreased p.o. intake and dark urine. Lower abdominal pain. EXAM: CT ABDOMEN AND PELVIS WITHOUT CONTRAST TECHNIQUE: Multidetector CT imaging of the abdomen and pelvis was performed following the standard protocol without IV contrast. COMPARISON:  06/08/2013 FINDINGS: Lower chest: Dependent atelectasis in the lung bases. Cardiac enlargement. Coronary artery calcification. Hepatobiliary: Circumscribed low-attenuation lesions in the liver are unchanged since prior study, likely cysts. Focal calcification in the posterior right lobe of the liver corresponds to a cyst in the previous study, possibly calcified cyst. Gallbladder is surgically absent. No bile duct dilatation. Pancreas: Unremarkable. No pancreatic ductal dilatation or surrounding inflammatory changes. Spleen: Normal in size  without focal abnormality. Adrenals/Urinary Tract: No adrenal gland nodules. 6 mm stone in the left proximal ureter at the level of the ureteropelvic junction. Left hydronephrosis with stranding around the left kidney. Distal ureter is decompressed. No hydronephrosis or hydroureter on the right. Right renal cysts. Small calcification versus hemorrhagic cyst on the anterior right parenchyma. Bladder is decompressed. Stomach/Bowel: Stomach is within normal limits. Appendix appears normal. No evidence of bowel wall thickening, distention, or inflammatory changes. Vascular/Lymphatic: Aortic atherosclerosis. No enlarged abdominal or pelvic lymph nodes. Reproductive: Prostate is unremarkable. Other: No abdominal wall hernia or abnormality. No abdominopelvic ascites. Musculoskeletal: Degenerative changes in the spine. No destructive bone lesions. IMPRESSION: Stone in the left ureteropelvic junction with moderate proximal obstruction. Incidental finding of multiple hepatic parenchymal cysts. Right renal cyst. Cardiac enlargement. Moderate aortic atherosclerosis. Electronically Signed   By: Lucienne Capers M.D.   On: 06/28/2021 22:08   CT Head Wo Contrast  Result Date: 06/28/2021 CLINICAL DATA:  Head trauma, minor. Additional history provided: Fall today, laceration to left forehead. EXAM: CT HEAD WITHOUT CONTRAST TECHNIQUE: Contiguous axial images were obtained from the base of the skull through the vertex without intravenous contrast. COMPARISON:  No pertinent prior exams available for comparison. FINDINGS: Brain: Mild generalized cerebral atrophy. There is no acute intracranial hemorrhage. No demarcated cortical infarct. No extra-axial fluid collection. No evidence of an intracranial mass. No midline shift. Vascular: No hyperdense vessel.  Atherosclerotic calcifications. Skull: Normal. Negative for fracture or focal lesion. Sinuses/Orbits: Visualized orbits show no acute finding. Trace bilateral maxillary sinus  mucosal thickening at the imaged levels. Other: Bilateral middle ear/mastoid effusions. IMPRESSION: No evidence of acute intracranial abnormality. Mild generalized cerebral atrophy. Bilateral middle ear/mastoid effusions. Electronically Signed   By: Kellie Simmering DO   On: 06/28/2021 14:23   NM Pulmonary Perfusion  Result Date: 06/29/2021 CLINICAL DATA:  Positive D-dimer. Recent URI symptoms, shortness of breath EXAM: NUCLEAR MEDICINE PERFUSION LUNG SCAN TECHNIQUE: Perfusion images were obtained in multiple projections after intravenous injection of radiopharmaceutical. Ventilation scans intentionally deferred if perfusion scan and chest x-ray adequate for interpretation during COVID 19 epidemic. RADIOPHARMACEUTICALS:  4.3 mCi Tc-17m MAA IV COMPARISON:  Chest x-ray 06/28/2021 FINDINGS: Homogeneous distribution of radiotracer throughout both lungs. No perfusion defect. IMPRESSION: Very low probability of pulmonary embolism. Electronically Signed   By: Davina Poke D.O.   On: 06/29/2021 12:56   DG CHEST PORT 1 VIEW  Result Date: 06/29/2021 CLINICAL DATA:  Shortness of breath EXAM: PORTABLE CHEST 1 VIEW COMPARISON:  June 28, 2021. FINDINGS: Stable cardiomegaly, likely accentuated by technique. Aortic atherosclerosis. Bibasilar subsegmental atelectasis. New right midlung opacity, which may represent atelectasis or  developing infection. No visible pleural effusion or pneumothorax. Severe degenerative change of the bilateral shoulders. IMPRESSION: New right midlung opacity, which may represent atelectasis or developing infection. Bibasilar opacities, favor atelectasis. Electronically Signed   By: Dahlia Bailiff MD   On: 06/29/2021 18:53   DG Chest Port 1 View  Result Date: 06/28/2021 CLINICAL DATA:  Shortness of breath. EXAM: PORTABLE CHEST 1 VIEW COMPARISON:  June 11, 2013. FINDINGS: Stable cardiomegaly. Left lung is clear. Minimal right basilar subsegmental atelectasis. The visualized skeletal structures are  unremarkable. IMPRESSION: Minimal right basilar subsegmental atelectasis. Electronically Signed   By: Marijo Conception M.D.   On: 06/28/2021 13:10   DG C-Arm 1-60 Min-No Report  Result Date: 06/29/2021 Fluoroscopy was utilized by the requesting physician.  No radiographic interpretation.   ECHOCARDIOGRAM COMPLETE  Result Date: 06/29/2021    ECHOCARDIOGRAM REPORT   Patient Name:   Sergio Schroeder Date of Exam: 06/29/2021 Medical Rec #:  [id number]       Height:       69.0 in Accession #:    [id number]      Weight:       250.0 lb Date of Birth:  12-01-1946       BSA:          2.272 m Patient Age:    29 years        BP:           96/61 mmHg Patient Gender: M               HR:           86 bpm. Exam Location:  [name] Procedure: 2D Echo, Cardiac Doppler and Color Doppler Indications:    Dyspnea  History:        Patient has no prior history of Echocardiogram examinations.                 Risk Factors:Hypertension and Current Smoker.  Sonographer:    Wenda Low Referring Phys: [place]  1. Poor acoustic windows make evaluation of regional wall motion difficult. NO definite wall motion abnormalities . Left ventricular ejection fraction, by estimation, is 55 to 60%. The left ventricle has normal function. There is mild left ventricular hypertrophy. Left ventricular diastolic parameters were normal.  2. Right ventricular systolic function is normal. The right ventricular size is normal.  3. Trivial mitral valve regurgitation.  4. The aortic valve is normal in structure. Aortic valve regurgitation is not visualized. FINDINGS  Left Ventricle: Poor acoustic windows make evaluation of regional wall motion difficult. NO definite wall motion abnormalities. Left ventricular ejection fraction, by estimation, is 55 to 60%. The left ventricle has normal function. The left ventricular  internal cavity size was normal in size. There is mild left ventricular hypertrophy. Left ventricular diastolic  parameters were normal. Right Ventricle: The right ventricular size is normal. Right vetricular wall thickness was not assessed. Right ventricular systolic function is normal. Left Atrium: Left atrial size was normal in size. Right Atrium: Right atrial size was normal in size. Pericardium: Trivial pericardial effusion is present. Mitral Valve: There is mild thickening of the mitral valve leaflet(s). Mild mitral annular calcification. Trivial mitral valve regurgitation. MV peak gradient, 5.5 mmHg. The mean mitral valve gradient is 3.0 mmHg. Tricuspid Valve: The tricuspid valve is normal in structure. Tricuspid valve regurgitation is trivial. Aortic Valve: The aortic valve is normal in structure. Aortic valve regurgitation is not visualized. Aortic valve mean gradient measures  9.0 mmHg. Aortic valve peak gradient measures 15.7 mmHg. Aortic valve area, by VTI measures 2.31 cm. Pulmonic Valve: The pulmonic valve was normal in structure. Pulmonic valve regurgitation is not visualized. Aorta: The aortic root is normal in size and structure. IAS/Shunts: No atrial level shunt detected by color flow Doppler.  LEFT VENTRICLE PLAX 2D LVIDd:         4.90 cm      Diastology LVIDs:         3.32 cm      LV e' medial:    9.73 cm/s LV PW:         1.54 cm      LV E/e' medial:  7.6 LV IVS:        1.40 cm      LV e' lateral:   8.85 cm/s LVOT diam:     2.20 cm      LV E/e' lateral: 8.3 LV SV:         90 LV SV Index:   39 LVOT Area:     3.80 cm  LV Volumes (MOD) LV vol d, MOD A2C: 124.0 ml LV vol d, MOD A4C: 155.0 ml LV vol s, MOD A2C: 65.3 ml LV vol s, MOD A4C: 72.6 ml LV SV MOD A2C:     58.7 ml LV SV MOD A4C:     155.0 ml LV SV MOD BP:      71.8 ml RIGHT VENTRICLE RV Mid diam:    3.95 cm RV S prime:     15.10 cm/s LEFT ATRIUM             Index       RIGHT ATRIUM           Index LA diam:        4.70 cm 2.07 cm/m  RA Area:     17.00 cm LA Vol (A2C):   53.7 ml 23.64 ml/m RA Volume:   43.60 ml  19.19 ml/m LA Vol (A4C):   58.9 ml  25.93 ml/m LA Biplane Vol: 60.3 ml 26.55 ml/m  AORTIC VALVE AV Area (Vmax):    2.23 cm AV Area (Vmean):   2.23 cm AV Area (VTI):     2.31 cm AV Vmax:           198.00 cm/s AV Vmean:          130.000 cm/s AV VTI:            0.389 m AV Peak Grad:      15.7 mmHg AV Mean Grad:      9.0 mmHg LVOT Vmax:         116.00 cm/s LVOT Vmean:        76.300 cm/s LVOT VTI:          0.236 m LVOT/AV VTI ratio: 0.61  AORTA Ao Root diam: 3.30 cm Ao Asc diam:  3.70 cm MITRAL VALVE MV Area (PHT): 4.15 cm     SHUNTS MV Area VTI:   3.15 cm     Systemic VTI:  0.24 m MV Peak grad:  5.5 mmHg     Systemic Diam: 2.20 cm MV Mean grad:  3.0 mmHg MV Vmax:       1.17 m/s MV Vmean:      73.8 cm/s MV Decel Time: 183 msec MV E velocity: 73.70 cm/s MV A velocity: 102.00 cm/s MV E/A ratio:  0.72 Dorris Carnes MD Electronically signed by Dorris Carnes MD Signature Date/Time: 06/29/2021/6:27:44 PM  Final         Scheduled Meds:  budesonide (PULMICORT) nebulizer solution  0.25 mg Nebulization BID   chlorhexidine  15 mL Mouth Rinse BID   Chlorhexidine Gluconate Cloth  6 each Topical Q0600   ipratropium  0.5 mg Nebulization TID   levalbuterol  0.63 mg Nebulization TID   mouth rinse  15 mL Mouth Rinse q12n4p   [START ON 07/01/2021] predniSONE  40 mg Oral Q breakfast   Continuous Infusions:  sodium chloride 20 mL/hr at 06/30/21 0650   cefTRIAXone (ROCEPHIN)  IV 2 g (06/30/21 1049)   lactated ringers       LOS: 1 day    Time spent: 105mins    Kathie Dike, MD [facility]   If 7PM-7AM, please contact night-coverage [email or web address]  06/30/2021, 11:13 AM

## 2021-07-01 ENCOUNTER — Inpatient Hospital Stay (HOSPITAL_COMMUNITY): Payer: Medicare Other

## 2021-07-01 DIAGNOSIS — N1330 Unspecified hydronephrosis: Secondary | ICD-10-CM | POA: Diagnosis present

## 2021-07-01 DIAGNOSIS — N201 Calculus of ureter: Secondary | ICD-10-CM | POA: Diagnosis present

## 2021-07-01 DIAGNOSIS — J449 Chronic obstructive pulmonary disease, unspecified: Secondary | ICD-10-CM | POA: Diagnosis present

## 2021-07-01 DIAGNOSIS — J9601 Acute respiratory failure with hypoxia: Secondary | ICD-10-CM | POA: Diagnosis present

## 2021-07-01 LAB — RENAL FUNCTION PANEL
Albumin: 2.9 g/dL — ABNORMAL LOW (ref 3.5–5.0)
Anion gap: 10 (ref 5–15)
BUN: 72 mg/dL — ABNORMAL HIGH (ref 8–23)
CO2: 20 mmol/L — ABNORMAL LOW (ref 22–32)
Calcium: 7.6 mg/dL — ABNORMAL LOW (ref 8.9–10.3)
Chloride: 104 mmol/L (ref 98–111)
Creatinine, Ser: 1.79 mg/dL — ABNORMAL HIGH (ref 0.61–1.24)
GFR, Estimated: 40 mL/min — ABNORMAL LOW
Glucose, Bld: 245 mg/dL — ABNORMAL HIGH (ref 70–99)
Phosphorus: 4.1 mg/dL (ref 2.5–4.6)
Potassium: 3.6 mmol/L (ref 3.5–5.1)
Sodium: 134 mmol/L — ABNORMAL LOW (ref 135–145)

## 2021-07-01 LAB — CBC
HCT: 30.2 % — ABNORMAL LOW (ref 39.0–52.0)
Hemoglobin: 10.6 g/dL — ABNORMAL LOW (ref 13.0–17.0)
MCH: 35.6 pg — ABNORMAL HIGH (ref 26.0–34.0)
MCHC: 35.1 g/dL (ref 30.0–36.0)
MCV: 101.3 fL — ABNORMAL HIGH (ref 80.0–100.0)
Platelets: 162 K/uL (ref 150–400)
RBC: 2.98 MIL/uL — ABNORMAL LOW (ref 4.22–5.81)
RDW: 23.5 % — ABNORMAL HIGH (ref 11.5–15.5)
WBC: 10.7 K/uL — ABNORMAL HIGH (ref 4.0–10.5)
nRBC: 0.5 % — ABNORMAL HIGH (ref 0.0–0.2)

## 2021-07-01 MED ORDER — POTASSIUM CHLORIDE CRYS ER 20 MEQ PO TBCR
40.0000 meq | EXTENDED_RELEASE_TABLET | Freq: Once | ORAL | Status: AC
  Administered 2021-07-01: 40 meq via ORAL
  Filled 2021-07-01: qty 2

## 2021-07-01 MED ORDER — AMLODIPINE BESYLATE 5 MG PO TABS
5.0000 mg | ORAL_TABLET | Freq: Every day | ORAL | Status: DC
  Administered 2021-07-01 – 2021-07-02 (×2): 5 mg via ORAL
  Filled 2021-07-01 (×2): qty 1

## 2021-07-01 MED ORDER — FUROSEMIDE 10 MG/ML IJ SOLN
40.0000 mg | Freq: Once | INTRAMUSCULAR | Status: AC
  Administered 2021-07-01: 40 mg via INTRAVENOUS
  Filled 2021-07-01: qty 4

## 2021-07-01 MED ORDER — ASPIRIN 325 MG PO TABS
325.0000 mg | ORAL_TABLET | Freq: Every day | ORAL | Status: DC
  Administered 2021-07-01 – 2021-07-02 (×2): 325 mg via ORAL
  Filled 2021-07-01 (×2): qty 1

## 2021-07-01 NOTE — Progress Notes (Addendum)
PROGRESS NOTE    Sergio Schroeder  WCH:[id number] DOB: 1946-06-19 DOA: 06/28/2021 PCP: [name] [facility]    Brief Narrative:  75 y/o male with history of HTN, tobacco, had a fall prior to admission while he was bent over working on his car. On arrival to ED, noted to have AKI, tachycardia and low BP. Further imaging indicated left hydronephrosis with UPJ stone. He is chronically short of breath and likely has some degree of COPD. Urology consulted for UPJ stone.    Assessment & Plan:   Active Problems:   AKI (acute kidney injury) ([place])   HTN (hypertension)   Tobacco abuse   Fall   Generalized weakness   Acute kidney injury -patient had decreased po intake and was also taking ACE/HCTZ -he is also noted to have a left UPJ stone with obstruction, but had stent placed on 7/8 -would continue on IV fluids -creatinine currently trending down 2.2->2.7->1.9->1.7 -BUN elevated, likely due to steroids -it is not clear what is his baseline creatinine since most recent labs are from 2014 -will try and contact pcp office tomorrow to see if they have more recent labs -Continue to follow  Left hydronephrosis with UPJ stone -urology consulted -start on ceftriaxone to cover any element of infection -urine culture with GNR, follow up  -Status post placement of left ureteral stent -will need urology follow up  Diarrhea -Patient was having multiple stools shortly after admission -No further stools since yesterday -C diff ordered, but discontinued since patient did not have any further stools  Probable COPD exacerbation -long history of tobacco use -Improving on steroids and bronchodilators -since wheezing resolved, will discontinue prednisone  Tachycardia - overall heart seems to have improved with IV fluids -echo with normal EF BNP mildly elevated -D dimer elevated at 6.84, VQ scan negative, suspect related to renal process -Overall heart rate has normalized  Fall/Generalized  weakness -patient had fall prior to admission while he was bent over trying to adjust his license plate -he did report hitting his head, no LOC, CT head negative -also suffered abrasions to his knees and forehead -seen by PT with no further PT recommended.   HTN -Blood pressure trending up -continue to hold ACE/HCTZ for now due to elevated creatinine -will start on amlodipine  Superficial thrombosis of greater saphenous vein -found on venous doppler, at the level of calf -discussed with Dr. Stanford Breed on for vascular surgery -recommendations to start on aspirin and repeat doppler in a few weeks which can be done at [facility] office -will make outpatient referral to [facility]   DVT prophylaxis: heparin injection 5,000 Units Start: 06/30/21 1400 Code Status: DNR Family Communication: Updated patient's son on 7/10 Disposition Plan: Status is: Inpatient  The patient will require care spanning > 2 midnights and should be moved to inpatient because: Ongoing diagnostic testing needed not appropriate for outpatient work up, IV treatments appropriate due to intensity of illness or inability to take PO, and Inpatient level of care appropriate due to severity of illness  Dispo: The patient is from: Home              Anticipated d/c is to: Home              Patient currently is not medically stable to d/c.   Difficult to place patient No     Consultants:  urology  Procedures:  7/8 left ureteral stent placement  Antimicrobials:  Ceftriaxone 7/8>    Subjective: Patient is feeling better today. Shortness  of breath improving. Wheezing improving. Ambulated with physical therapy  Objective: Vitals:   07/01/21 0700 07/01/21 0735 07/01/21 0800 07/01/21 0900  BP: (!) 160/87   (!) 141/68  Pulse: 60  69 63  Resp:   11   Temp:      TempSrc:      SpO2: 97% 99% 94% 93%  Weight:      Height:        Intake/Output Summary (Last 24 hours) at 07/01/2021 1118 Last data filed at 07/01/2021 0600 Gross per  24 hour  Intake 1149.4 ml  Output 1125 ml  Net 24.4 ml   Filed Weights   06/28/21 1145  Weight: 113.4 kg    Examination:  General exam: Alert, awake, oriented x 3 Respiratory system: crackles at bases. Respiratory effort normal. Cardiovascular system:RRR. No murmurs, rubs, gallops. Gastrointestinal system: Abdomen is nondistended, soft and nontender. No organomegaly or masses felt. Normal bowel sounds heard. Central nervous system: Alert and oriented. No focal neurological deficits. Extremities: 1+ edema bilaterally Skin: No rashes, lesions or ulcers Psychiatry: Judgement and insight appear normal. Mood & affect appropriate.       Data Reviewed: I have personally reviewed following labs and imaging studies  CBC: Recent Labs  Lab 06/28/21 1247 06/29/21 0339 06/30/21 0553 07/01/21 0207  WBC 7.1 19.5* 13.5* 10.7*  NEUTROABS 6.9  --   --   --   HGB 12.5* 11.2* 10.8* 10.6*  HCT 36.6* 33.6* 32.2* 30.2*  MCV 104.9* 105.7* 102.5* 101.3*  PLT 229 215 168 254   Basic Metabolic Panel: Recent Labs  Lab 06/28/21 1247 06/29/21 0339 06/30/21 0942 07/01/21 1003  NA 137 137 136 134*  K 3.8 4.3 4.0 3.6  CL 102 101 105 104  CO2 26 24 22  20*  GLUCOSE 90 75 142* 245*  BUN 35* 47* 60* 72*  CREATININE 2.25* 2.73* 1.93* 1.79*  CALCIUM 8.3* 8.0* 7.4* 7.6*  MG 1.4*  --   --   --   PHOS  --   --   --  4.1   GFR: Estimated Creatinine Clearance: 45.6 mL/min (A) (by C-G formula based on SCr of 1.79 mg/dL (H)). Liver Function Tests: Recent Labs  Lab 06/28/21 1247 06/29/21 0339 07/01/21 1003  AST 31 27  --   ALT <5 22  --   ALKPHOS 116 57  --   BILITOT 1.8* 1.3*  --   PROT 6.5 6.1*  --   ALBUMIN 3.2* 3.2* 2.9*   No results for input(s): LIPASE, AMYLASE in the last 168 hours. No results for input(s): AMMONIA in the last 168 hours. Coagulation Profile: No results for input(s): INR, PROTIME in the last 168 hours. Cardiac Enzymes: Recent Labs  Lab 06/28/21 1247  CKTOTAL  156   BNP (last 3 results) No results for input(s): PROBNP in the last 8760 hours. HbA1C: No results for input(s): HGBA1C in the last 72 hours. CBG: No results for input(s): GLUCAP in the last 168 hours. Lipid Profile: No results for input(s): CHOL, HDL, LDLCALC, TRIG, CHOLHDL, LDLDIRECT in the last 72 hours. Thyroid Function Tests: No results for input(s): TSH, T4TOTAL, FREET4, T3FREE, THYROIDAB in the last 72 hours. Anemia Panel: No results for input(s): VITAMINB12, FOLATE, FERRITIN, TIBC, IRON, RETICCTPCT in the last 72 hours. Sepsis Labs: No results for input(s): PROCALCITON, LATICACIDVEN in the last 168 hours.  Recent Results (from the past 240 hour(s))  SARS CORONAVIRUS 2 (TAT 6-24 HRS) Nasopharyngeal Nasopharyngeal Swab     Status: None  Collection Time: 06/28/21  8:31 PM   Specimen: Nasopharyngeal Swab  Result Value Ref Range Status   SARS Coronavirus 2 NEGATIVE NEGATIVE Final    Comment: (NOTE) SARS-CoV-2 target nucleic acids are NOT DETECTED.  The SARS-CoV-2 RNA is generally detectable in upper and lower respiratory specimens during the acute phase of infection. Negative results do not preclude SARS-CoV-2 infection, do not rule out co-infections with other pathogens, and should not be used as the sole basis for treatment or other patient management decisions. Negative results must be combined with clinical observations, patient history, and epidemiological information. The expected result is Negative.  Fact Sheet for Patients: SugarRoll.be  Fact Sheet for Healthcare Providers: https://www.woods-mathews.com/  This test is not yet approved or cleared by the [place] FDA and  has been authorized for detection and/or diagnosis of SARS-CoV-2 by FDA under an Emergency Use Authorization (EUA). This EUA will remain  in effect (meaning this test can be used) for the duration of the COVID-19 declaration under Se ction  564(b)(1) of the Act, 21 U.S.C. section 360bbb-3(b)(1), unless the authorization is terminated or revoked sooner.  Performed at [facility], [place] [address] [place], [place] [address]   Culture, Urine     Status: Abnormal (Preliminary result)   Collection Time: 06/29/21 11:43 AM   Specimen: Urine, Clean Catch  Result Value Ref Range Status   Specimen Description   Final    URINE, CLEAN CATCH Performed at [facility], [address] [place], [place] [address]    Special Requests   Final    NONE Performed at [facility], [address], [place], [place] [address]    Culture (A)  Final    40,000 COLONIES/mL GRAM NEGATIVE RODS SUSCEPTIBILITIES TO FOLLOW Performed at [facility], [place] [address] [place], [place] [address]    Report Status PENDING  Incomplete  Resp Panel by RT-PCR (Flu A&B, Covid) Nasopharyngeal Swab     Status: None   Collection Time: 06/29/21 12:24 PM   Specimen: Nasopharyngeal Swab; Nasopharyngeal(NP) swabs in vial transport medium  Result Value Ref Range Status   SARS Coronavirus 2 by RT PCR NEGATIVE NEGATIVE Final    Comment: (NOTE) SARS-CoV-2 target nucleic acids are NOT DETECTED.  The SARS-CoV-2 RNA is generally detectable in upper respiratory specimens during the acute phase of infection. The lowest concentration of SARS-CoV-2 viral copies this assay can detect is 138 copies/mL. A negative result does not preclude SARS-Cov-2 infection and should not be used as the sole basis for treatment or other patient management decisions. A negative result may occur with  improper specimen collection/handling, submission of specimen other than nasopharyngeal swab, presence of viral mutation(s) within the areas targeted by this assay, and inadequate number of viral copies(<138 copies/mL). A negative result must be combined with clinical observations, patient history, and epidemiological information. The expected result is Negative.  Fact Sheet for  Patients:  EntrepreneurPulse.com.au  Fact Sheet for Healthcare Providers:  IncredibleEmployment.be  This test is no t yet approved or cleared by the [place] FDA and  has been authorized for detection and/or diagnosis of SARS-CoV-2 by FDA under an Emergency Use Authorization (EUA). This EUA will remain  in effect (meaning this test can be used) for the duration of the COVID-19 declaration under Section 564(b)(1) of the Act, 21 U.S.C.section 360bbb-3(b)(1), unless the authorization is terminated  or revoked sooner.       Influenza A by PCR NEGATIVE NEGATIVE Final   Influenza B by  PCR NEGATIVE NEGATIVE Final    Comment: (NOTE) The Xpert Xpress SARS-CoV-2/FLU/RSV plus assay is intended as an aid in the diagnosis of influenza from Nasopharyngeal swab specimens and should not be used as a sole basis for treatment. Nasal washings and aspirates are unacceptable for Xpert Xpress SARS-CoV-2/FLU/RSV testing.  Fact Sheet for Patients: EntrepreneurPulse.com.au  Fact Sheet for Healthcare Providers: IncredibleEmployment.be  This test is not yet approved or cleared by the [place] FDA and has been authorized for detection and/or diagnosis of SARS-CoV-2 by FDA under an Emergency Use Authorization (EUA). This EUA will remain in effect (meaning this test can be used) for the duration of the COVID-19 declaration under Section 564(b)(1) of the Act, 21 U.S.C. section 360bbb-3(b)(1), unless the authorization is terminated or revoked.  Performed at [facility], [place] [address] [place], [place] [address]   MRSA Next Gen by PCR, Nasal     Status: None   Collection Time: 06/29/21  9:00 PM   Specimen: Nasal Mucosa; Nasal Swab  Result Value Ref Range Status   MRSA by PCR Next Gen NOT DETECTED NOT DETECTED Final    Comment: (NOTE) The GeneXpert MRSA Assay (FDA approved for NASAL specimens only), is one  component of a comprehensive MRSA colonization surveillance program. It is not intended to diagnose MRSA infection nor to guide or monitor treatment for MRSA infections. Test performance is not FDA approved in patients less than 13 years old. Performed at [facility], [address], [place], [place] [address]          Radiology Studies: NM Pulmonary Perfusion  Result Date: 06/29/2021 CLINICAL DATA:  Positive D-dimer. Recent URI symptoms, shortness of breath EXAM: NUCLEAR MEDICINE PERFUSION LUNG SCAN TECHNIQUE: Perfusion images were obtained in multiple projections after intravenous injection of radiopharmaceutical. Ventilation scans intentionally deferred if perfusion scan and chest x-ray adequate for interpretation during COVID 19 epidemic. RADIOPHARMACEUTICALS:  4.3 mCi Tc-67m MAA IV COMPARISON:  Chest x-ray 06/28/2021 FINDINGS: Homogeneous distribution of radiotracer throughout both lungs. No perfusion defect. IMPRESSION: Very low probability of pulmonary embolism. Electronically Signed   By: Davina Poke D.O.   On: 06/29/2021 12:56   DG CHEST PORT 1 VIEW  Result Date: 06/29/2021 CLINICAL DATA:  Shortness of breath EXAM: PORTABLE CHEST 1 VIEW COMPARISON:  June 28, 2021. FINDINGS: Stable cardiomegaly, likely accentuated by technique. Aortic atherosclerosis. Bibasilar subsegmental atelectasis. New right midlung opacity, which may represent atelectasis or developing infection. No visible pleural effusion or pneumothorax. Severe degenerative change of the bilateral shoulders. IMPRESSION: New right midlung opacity, which may represent atelectasis or developing infection. Bibasilar opacities, favor atelectasis. Electronically Signed   By: Dahlia Bailiff MD   On: 06/29/2021 18:53   DG C-Arm 1-60 Min-No Report  Result Date: 06/29/2021 Fluoroscopy was utilized by the requesting physician.  No radiographic interpretation.        Scheduled Meds:  amLODipine  5 mg Oral Daily   budesonide  (PULMICORT) nebulizer solution  0.25 mg Nebulization BID   Chlorhexidine Gluconate Cloth  6 each Topical Q0600   furosemide  40 mg Intravenous Once   heparin injection (subcutaneous)  5,000 Units Subcutaneous Q8H   ipratropium  0.5 mg Nebulization TID   levalbuterol  0.63 mg Nebulization TID   mouth rinse  15 mL Mouth Rinse BID   potassium chloride  40 mEq Oral Once   Continuous Infusions:  sodium chloride 20 mL/hr at 06/30/21 0650   cefTRIAXone (ROCEPHIN)  IV Stopped (06/30/21 1141)  LOS: 2 days    Time spent: 50mins    Kathie Dike, MD [facility]   If 7PM-7AM, please contact night-coverage [email or web address]  07/01/2021, 11:18 AM

## 2021-07-01 NOTE — TOC Transition Note (Signed)
Transition of Care [facility]) - CM/SW Discharge Note   Patient Details  Name: FAMOUS EISENHARDT MRN: [id number] Date of Birth: 1946/06/21  Transition of [facility]) CM/SW Contact:  Natasha Bence, LCSW Phone Number: 07/01/2021, 1:51 PM   Clinical Narrative:    CSW notified of patient's readiness for discharge. CSW also notified of patient's need for RW. CSW referred patient to [employer]. [employer] agreeable to provide RW. TOC signing off.    Final next level of care: Home/Self Care Barriers to Discharge: Barriers Resolved   Patient Goals and CMS Choice Patient states their goals for this hospitalization and ongoing recovery are:: Return home CMS Medicare.gov Compare Post Acute Care list provided to:: Patient Choice offered to / list presented to : Patient  Discharge Placement                    Patient and family notified of of transfer: 07/01/21  Discharge Plan and Services                DME Arranged: Gilford Rile rolling DME Agency: [employer] Date DME Agency Contacted: 07/01/21 Time DME Agency Contacted: [phone] Representative spoke with at DME Agency: [place] ([place]) Interventions     Readmission Risk Interventions No flowsheet data found.

## 2021-07-01 NOTE — Evaluation (Signed)
Physical Therapy Evaluation Patient Details Name: Sergio Schroeder MRN: [id number] DOB: 1946/12/18 Today's Date: 07/01/2021   History of Present Illness  Sergio Schroeder is a 75 y.o. male with medical history significant of hypertension, tobacco use, was reportedly at the [facility] today and had squatted down to change his license plate.  Upon standing, patient reports that he lost his balance and fell.  He initially fell on his knees causing abrasions to his knees.  When he tried to get up again, he fell again and hit his head.  He did not lose consciousness.  He feels generally weak.  He has felt this way for the past few days.  Reported that his p.o. intake has been poor the past few days.  He is also noted decreased urine output and dark urine for the past few days.  His son reports that patient did complain of abdominal pain along his lower abdomen for the past few days with decreased p.o. intake.  His son is concerned that he may have diverticulitis since his symptoms did start after eating cucumbers and seedlings watermelon.  Patient reports having chronic diarrhea, but this is slowed down over the past few days.  He has not had any vomiting.  He reports chronic shortness of breath, but worse over the past few days.  He has not had any fever or cough.   Clinical Impression  Patient seated in chair at beginning of session. Patient O2 sat monitored throughout session on room air at 95% at rest, decrease to 90-86% with ambulation (which may be due to pulse ox position on finger), improves to low 90s with repositioning pulse ox/ returning to chair, minimal to no SOB throughout. Patient ambulates with RW and would benefit from RW at home to improve balance and reduce the risk of falling. Patient does not require additional PT services at this time. Patient discharged to care of nursing for ambulation daily as tolerated for length of stay.      Follow Up Recommendations No PT follow up    Equipment  Recommendations  Rolling walker with 5" wheels    Recommendations for Other Services       Precautions / Restrictions Precautions Precautions: Fall Restrictions Weight Bearing Restrictions: No      Mobility  Bed Mobility               General bed mobility comments: seated in chair at beginning of session    Transfers Overall transfer level: Needs assistance Equipment used: Rolling walker (2 wheeled) Transfers: Sit to/from Stand;Stand Pivot Transfers Sit to Stand: Min guard Stand pivot transfers: Min guard       General transfer comment: transfer to standing with RW, slighlty unsteady upon standing  Ambulation/Gait Ambulation/Gait assistance: Min guard Gait Distance (Feet): 120 Feet Assistive device: Rolling walker (2 wheeled) Gait Pattern/deviations: Step-through pattern;Decreased stride length Gait velocity: decreased   General Gait Details: decreased cadence with RW, no loss of balance, minimal unsteadiness  Stairs            Wheelchair Mobility    Modified Rankin (Stroke Patients Only)       Balance Overall balance assessment: Mild deficits observed, not formally tested                                           Pertinent Vitals/Pain Pain Assessment: No/denies pain    Home  Living Family/patient expects to be discharged to:: Private residence Living Arrangements: Alone Available Help at Discharge: Family Type of Home: Mobile home Home Access: Stairs to enter Entrance Stairs-Rails: Left Entrance Stairs-Number of Steps: 3 Home Layout: One level Home Equipment: None Additional Comments: Above info for patient's residence; Patient states will be going to son's house and they can assist him 24/7 for a few days    Prior Function Level of Independence: Needs assistance   Gait / Transfers Assistance Needed: patient states mostly household ambulation without AD  ADL's / Homemaking Assistance Needed: Independent with ADL         Hand Dominance        Extremity/Trunk Assessment   Upper Extremity Assessment Upper Extremity Assessment: Overall WFL for tasks assessed    Lower Extremity Assessment Lower Extremity Assessment: Overall WFL for tasks assessed;Generalized weakness    Cervical / Trunk Assessment Cervical / Trunk Assessment: Normal  Communication   Communication: No difficulties  Cognition Arousal/Alertness: Awake/alert Behavior During Therapy: WFL for tasks assessed/performed Overall Cognitive Status: Within Functional Limits for tasks assessed                                        General Comments      Exercises     Assessment/Plan    PT Assessment Patent does not need any further PT services  PT Problem List         PT Treatment Interventions      PT Goals (Current goals can be found in the Care Plan section)  Acute Rehab PT Goals Patient Stated Goal: Return home PT Goal Formulation: With patient Time For Goal Achievement: 07/01/21 Potential to Achieve Goals: Good    Frequency     Barriers to discharge        Co-evaluation               AM-PAC PT "6 Clicks" Mobility  Outcome Measure Help needed turning from your back to your side while in a flat bed without using bedrails?: None Help needed moving from lying on your back to sitting on the side of a flat bed without using bedrails?: None Help needed moving to and from a bed to a chair (including a wheelchair)?: A Little Help needed standing up from a chair using your arms (e.g., wheelchair or bedside chair)?: A Little Help needed to walk in hospital room?: A Little Help needed climbing 3-5 steps with a railing? : A Little 6 Click Score: 20    End of Session   Activity Tolerance: Patient tolerated treatment well Patient left: with call bell/phone within reach;in chair Nurse Communication: Mobility status PT Visit Diagnosis: Unsteadiness on feet (R26.81);History of falling (Z91.81);Other  abnormalities of gait and mobility (R26.89)    Time: 1050-1101 PT Time Calculation (min) (ACUTE ONLY): 11 min   Charges:   PT Evaluation $PT Eval Low Complexity: 1 Low         11:17 AM, 07/01/21 Mearl Latin PT, DPT Physical Therapist at [facility]

## 2021-07-02 ENCOUNTER — Encounter (HOSPITAL_COMMUNITY): Payer: Self-pay | Admitting: Urology

## 2021-07-02 DIAGNOSIS — J9601 Acute respiratory failure with hypoxia: Secondary | ICD-10-CM

## 2021-07-02 DIAGNOSIS — J449 Chronic obstructive pulmonary disease, unspecified: Secondary | ICD-10-CM

## 2021-07-02 DIAGNOSIS — N1330 Unspecified hydronephrosis: Secondary | ICD-10-CM

## 2021-07-02 LAB — CBC
HCT: 34.2 % — ABNORMAL LOW (ref 39.0–52.0)
Hemoglobin: 11.5 g/dL — ABNORMAL LOW (ref 13.0–17.0)
MCH: 35 pg — ABNORMAL HIGH (ref 26.0–34.0)
MCHC: 33.6 g/dL (ref 30.0–36.0)
MCV: 104 fL — ABNORMAL HIGH (ref 80.0–100.0)
Platelets: 187 10*3/uL (ref 150–400)
RBC: 3.29 MIL/uL — ABNORMAL LOW (ref 4.22–5.81)
RDW: 24.4 % — ABNORMAL HIGH (ref 11.5–15.5)
WBC: 16.8 10*3/uL — ABNORMAL HIGH (ref 4.0–10.5)
nRBC: 0.3 % — ABNORMAL HIGH (ref 0.0–0.2)

## 2021-07-02 LAB — URINE CULTURE: Culture: 40000 — AB

## 2021-07-02 LAB — RENAL FUNCTION PANEL
Albumin: 3.1 g/dL — ABNORMAL LOW (ref 3.5–5.0)
Anion gap: 11 (ref 5–15)
BUN: 75 mg/dL — ABNORMAL HIGH (ref 8–23)
CO2: 25 mmol/L (ref 22–32)
Calcium: 8.4 mg/dL — ABNORMAL LOW (ref 8.9–10.3)
Chloride: 104 mmol/L (ref 98–111)
Creatinine, Ser: 1.65 mg/dL — ABNORMAL HIGH (ref 0.61–1.24)
GFR, Estimated: 44 mL/min — ABNORMAL LOW (ref >60)
Glucose, Bld: 137 mg/dL — ABNORMAL HIGH (ref 70–99)
Phosphorus: 3.9 mg/dL (ref 2.5–4.6)
Potassium: 4.2 mmol/L (ref 3.5–5.1)
Sodium: 140 mmol/L (ref 135–145)

## 2021-07-02 MED ORDER — ASPIRIN 325 MG PO TABS: 325.0000 mg | ORAL_TABLET | Freq: Every day | ORAL | 1 refills | Status: AC

## 2021-07-02 MED ORDER — ALBUTEROL SULFATE HFA 108 (90 BASE) MCG/ACT IN AERS: 2.0000 | INHALATION_SPRAY | Freq: Four times a day (QID) | RESPIRATORY_TRACT | 2 refills | Status: AC | PRN

## 2021-07-02 MED ORDER — AMLODIPINE BESYLATE 5 MG PO TABS: 5.0000 mg | ORAL_TABLET | Freq: Every day | ORAL | 1 refills | Status: DC

## 2021-07-02 MED ORDER — CEFDINIR 300 MG PO CAPS: 300.0000 mg | ORAL_CAPSULE | Freq: Two times a day (BID) | ORAL | 0 refills | Status: DC

## 2021-07-02 NOTE — Discharge Summary (Signed)
Physician Discharge Summary  Sergio Schroeder XTG:[id number] DOB: 20-Aug-1946 DOA: 06/28/2021  PCP: [name] [facility] date: 06/28/2021 Discharge date: 07/02/2021  Admitted From: home Disposition:  home  Recommendations for Outpatient Follow-up:  Follow up with PCP scheduled on 7/14 Please obtain BMET at that time Follow up with urology referral has been made. They will contact him with appointment Referral for outpatient vascular surgery made Referral for outpatient pulmonology made  Home Health: Equipment/Devices:walker  Discharge Condition:stable CODE STATUS:DNR Diet recommendation: heart healthy  Brief/Interim Summary: 75 y/o male with history of HTN, tobacco, had a fall prior to admission while he was bent over working on his car. On arrival to ED, noted to have AKI, tachycardia and low BP. Further imaging indicated left hydronephrosis with UPJ stone. He is chronically short of breath and likely has some degree of COPD. Urology consulted for UPJ stone  Discharge Diagnoses:  Active Problems:   AKI (acute kidney injury) ([place])   HTN (hypertension)   Tobacco abuse   Fall   Generalized weakness   Acute respiratory failure with hypoxia (HCC)   Left ureteral stone   Hydronephrosis, left   COPD (chronic obstructive pulmonary disease) (HCC)  Acute kidney injury Baseline creatinine 0.8 in 04/2021 -patient had decreased po intake and was also taking ACE/HCTZ -he is also noted to have a left UPJ stone with obstruction, but had stent placed on 7/8 -creatinine currently trending down 2.2->2.7->1.9->1.7->1.6 -BUN elevated, likely due to steroids -since creatinine is trending in right direction, will plan on discharge today with close follow up with PCP -continue to hold ACE/HCTZ for now   Left hydronephrosis with UPJ stone -urology consulted -Initially started on ceftriaxone to cover any element of infection -urine culture with E coli, antibiotics transition to  [facility] -Status post placement of left ureteral stent -will need urology follow up   Diarrhea -Patient was having multiple stools shortly after admission -No further stools since yesterday -C diff ordered, but discontinued since patient did not have any further stools -Bowel movements are now normal   Probable COPD exacerbation -long history of tobacco use -Improving on steroids and bronchodilators -Overall respiratory status appears to be back to baseline, steroids have been discontinued -We will discharge with albuterol inhaler as needed -Outpatient referral to pulmonology has been made for PFTs   Tachycardia -echo with normal EF, BNP mildly elevated -D dimer elevated at 6.84, VQ scan negative -Overall heart rate has normalized   Fall/Generalized weakness -patient had fall prior to admission while he was bent over trying to adjust his license plate -he did report hitting his head, no LOC, CT head negative -also suffered abrasions to his knees and forehead -seen by PT with no further PT recommended.   HTN -Blood pressure trending up -continue to hold ACE/HCTZ for now due to elevated creatinine -Started on amlodipine   Superficial thrombosis of greater saphenous vein -found on venous doppler, at the level of calf -discussed with Dr. Stanford Breed on for vascular surgery -recommendations to start on aspirin and repeat doppler in a few weeks which can be done at [facility] office -will make outpatient referral to [facility]  Discharge Instructions  Discharge Instructions     Ambulatory referral to Pulmonology   Complete by: As directed    Reason for referral: Asthma/COPD   Ambulatory referral to Vascular Surgery   Complete by: As directed    Patient with thrombosis of greater saphenous vein below knee. Discussed with Dr. Stanford Breed who recommended [facility] follow up  in 2 weeks with repeat venous dopplers   Diet - low sodium heart healthy   Complete by: As directed    Increase activity slowly    Complete by: As directed       Allergies as of 07/02/2021   No Known Allergies      Medication List     STOP taking these medications    hydrochlorothiazide 25 MG tablet Commonly known as: HYDRODIURIL   losartan 100 MG tablet Commonly known as: COZAAR       TAKE these medications    albuterol 108 (90 Base) MCG/ACT inhaler Commonly known as: VENTOLIN HFA Inhale 2 puffs into the lungs every 6 (six) hours as needed for wheezing or shortness of breath.   amLODipine 5 MG tablet Commonly known as: NORVASC Take 1 tablet (5 mg total) by mouth daily. Start taking on: July 03, 2021   aspirin 325 MG tablet Take 1 tablet (325 mg total) by mouth daily. Start taking on: July 03, 2021   cefdinir 300 MG capsule Commonly known as: OMNICEF Take 1 capsule (300 mg total) by mouth 2 (two) times daily.   tobramycin-dexamethasone ophthalmic solution Commonly known as: TOBRADEX   VITAMIN D PO Take 1 tablet by mouth daily.               Durable Medical Equipment  (From admission, onward)           Start     Ordered   07/01/21 1152  For home use only DME Walker rolling  Once       Question Answer Comment  Walker: With [employer] Wheels   Patient needs a walker to treat with the following condition Generalized weakness      07/01/21 1152            Follow-up [place] Follow up.   Why: RW Contact information: [address] [place] [address] [phone]         pulmonology office will call you with an appointment Follow up.          [facility] Follow up.   Specialty: Vascular Surgery Why: they will call you with an appointment Contact information: [place] Follow up.   Specialty: Urology Contact information: [phone]        Nsumanganyi, Ferdinand Lango, NP Follow  up on 07/05/2021.   Specialty: Adult Health Nurse Practitioner Why: 11:30am Contact information: [place] [address] [phone]                No Known Allergies  Consultations: urology   Procedures/Studies: CT ABDOMEN PELVIS WO CONTRAST  Result Date: 06/28/2021 CLINICAL DATA:  Decreased p.o. intake and dark urine. Lower abdominal pain. EXAM: CT ABDOMEN AND PELVIS WITHOUT CONTRAST TECHNIQUE: Multidetector CT imaging of the abdomen and pelvis was performed following the standard protocol without IV contrast. COMPARISON:  06/08/2013 FINDINGS: Lower chest: Dependent atelectasis in the lung bases. Cardiac enlargement. Coronary artery calcification. Hepatobiliary: Circumscribed low-attenuation lesions in the liver are unchanged since prior study, likely cysts. Focal calcification in the posterior right lobe of the liver corresponds to a cyst in the previous study, possibly calcified cyst. Gallbladder is surgically absent. No bile duct dilatation. Pancreas: Unremarkable. No pancreatic ductal dilatation or surrounding inflammatory changes. Spleen: Normal in size without focal abnormality. Adrenals/Urinary  Tract: No adrenal gland nodules. 6 mm stone in the left proximal ureter at the level of the ureteropelvic junction. Left hydronephrosis with stranding around the left kidney. Distal ureter is decompressed. No hydronephrosis or hydroureter on the right. Right renal cysts. Small calcification versus hemorrhagic cyst on the anterior right parenchyma. Bladder is decompressed. Stomach/Bowel: Stomach is within normal limits. Appendix appears normal. No evidence of bowel wall thickening, distention, or inflammatory changes. Vascular/Lymphatic: Aortic atherosclerosis. No enlarged abdominal or pelvic lymph nodes. Reproductive: Prostate is unremarkable. Other: No abdominal wall hernia or abnormality. No abdominopelvic ascites. Musculoskeletal: Degenerative changes in the  spine. No destructive bone lesions. IMPRESSION: Stone in the left ureteropelvic junction with moderate proximal obstruction. Incidental finding of multiple hepatic parenchymal cysts. Right renal cyst. Cardiac enlargement. Moderate aortic atherosclerosis. Electronically Signed   By: Lucienne Capers M.D.   On: 06/28/2021 22:08   CT Head Wo Contrast  Result Date: 06/28/2021 CLINICAL DATA:  Head trauma, minor. Additional history provided: Fall today, laceration to left forehead. EXAM: CT HEAD WITHOUT CONTRAST TECHNIQUE: Contiguous axial images were obtained from the base of the skull through the vertex without intravenous contrast. COMPARISON:  No pertinent prior exams available for comparison. FINDINGS: Brain: Mild generalized cerebral atrophy. There is no acute intracranial hemorrhage. No demarcated cortical infarct. No extra-axial fluid collection. No evidence of an intracranial mass. No midline shift. Vascular: No hyperdense vessel.  Atherosclerotic calcifications. Skull: Normal. Negative for fracture or focal lesion. Sinuses/Orbits: Visualized orbits show no acute finding. Trace bilateral maxillary sinus mucosal thickening at the imaged levels. Other: Bilateral middle ear/mastoid effusions. IMPRESSION: No evidence of acute intracranial abnormality. Mild generalized cerebral atrophy. Bilateral middle ear/mastoid effusions. Electronically Signed   By: Kellie Simmering DO   On: 06/28/2021 14:23   NM Pulmonary Perfusion  Result Date: 06/29/2021 CLINICAL DATA:  Positive D-dimer. Recent URI symptoms, shortness of breath EXAM: NUCLEAR MEDICINE PERFUSION LUNG SCAN TECHNIQUE: Perfusion images were obtained in multiple projections after intravenous injection of radiopharmaceutical. Ventilation scans intentionally deferred if perfusion scan and chest x-ray adequate for interpretation during COVID 19 epidemic. RADIOPHARMACEUTICALS:  4.3 mCi Tc-62m MAA IV COMPARISON:  Chest x-ray 06/28/2021 FINDINGS: Homogeneous distribution  of radiotracer throughout both lungs. No perfusion defect. IMPRESSION: Very low probability of pulmonary embolism. Electronically Signed   By: Davina Poke D.O.   On: 06/29/2021 12:56   US Venous Img Lower Bilateral (DVT)  Result Date: 07/01/2021 CLINICAL DATA:  Bilateral lower extremity edema status post fall 4 days ago. Syncope. EXAM: BILATERAL LOWER EXTREMITY VENOUS DOPPLER ULTRASOUND TECHNIQUE: Gray-scale sonography with compression, as well as color and duplex ultrasound, were performed to evaluate the deep venous system(s) from the level of the common femoral vein through the popliteal and proximal calf veins. COMPARISON:  None. FINDINGS: VENOUS There is thrombosis of the greater saphenous vein at the level of the right calf. Normal compressibility of the common femoral, superficial femoral, and popliteal veins. Visualized portions of profunda femoral vein unremarkable. No filling defects to suggest DVT on grayscale or color Doppler imaging. Doppler waveforms show normal direction of venous flow, normal respiratory plasticity and response to augmentation. OTHER None. Limitations: none IMPRESSION: 1. No deep vein thrombosis. 2. Superficial venous thrombosis of right greater saphenous vein at the level of the calf. These results will be called to the ordering clinician or representative by the Radiologist Assistant, and communication documented in the PACS or [employer]. Electronically Signed   By: Miachel Roux M.D.   On: 07/01/2021 13:14  DG CHEST PORT 1 VIEW  Result Date: 06/29/2021 CLINICAL DATA:  Shortness of breath EXAM: PORTABLE CHEST 1 VIEW COMPARISON:  June 28, 2021. FINDINGS: Stable cardiomegaly, likely accentuated by technique. Aortic atherosclerosis. Bibasilar subsegmental atelectasis. New right midlung opacity, which may represent atelectasis or developing infection. No visible pleural effusion or pneumothorax. Severe degenerative change of the bilateral shoulders. IMPRESSION: New  right midlung opacity, which may represent atelectasis or developing infection. Bibasilar opacities, favor atelectasis. Electronically Signed   By: Dahlia Bailiff MD   On: 06/29/2021 18:53   DG Chest Port 1 View  Result Date: 06/28/2021 CLINICAL DATA:  Shortness of breath. EXAM: PORTABLE CHEST 1 VIEW COMPARISON:  June 11, 2013. FINDINGS: Stable cardiomegaly. Left lung is clear. Minimal right basilar subsegmental atelectasis. The visualized skeletal structures are unremarkable. IMPRESSION: Minimal right basilar subsegmental atelectasis. Electronically Signed   By: Marijo Conception M.D.   On: 06/28/2021 13:10   DG C-Arm 1-60 Min-No Report  Result Date: 06/29/2021 Fluoroscopy was utilized by the requesting physician.  No radiographic interpretation.   ECHOCARDIOGRAM COMPLETE  Result Date: 06/29/2021    ECHOCARDIOGRAM REPORT   Patient Name:   Sergio Schroeder Date of Exam: 06/29/2021 Medical Rec #:  [id number]       Height:       69.0 in Accession #:    [id number]      Weight:       250.0 lb Date of Birth:  [birth year]       BSA:          2.272 m Patient Age:    56 years        BP:           96/61 mmHg Patient Gender: M               HR:           86 bpm. Exam Location:  [name] Procedure: 2D Echo, Cardiac Doppler and Color Doppler Indications:    Dyspnea  History:        Patient has no prior history of Echocardiogram examinations.                 Risk Factors:Hypertension and Current Smoker.  Sonographer:    Wenda Low Referring Phys: [place]  1. Poor acoustic windows make evaluation of regional wall motion difficult. NO definite wall motion abnormalities . Left ventricular ejection fraction, by estimation, is 55 to 60%. The left ventricle has normal function. There is mild left ventricular hypertrophy. Left ventricular diastolic parameters were normal.  2. Right ventricular systolic function is normal. The right ventricular size is normal.  3. Trivial mitral valve regurgitation.  4.  The aortic valve is normal in structure. Aortic valve regurgitation is not visualized. FINDINGS  Left Ventricle: Poor acoustic windows make evaluation of regional wall motion difficult. NO definite wall motion abnormalities. Left ventricular ejection fraction, by estimation, is 55 to 60%. The left ventricle has normal function. The left ventricular  internal cavity size was normal in size. There is mild left ventricular hypertrophy. Left ventricular diastolic parameters were normal. Right Ventricle: The right ventricular size is normal. Right vetricular wall thickness was not assessed. Right ventricular systolic function is normal. Left Atrium: Left atrial size was normal in size. Right Atrium: Right atrial size was normal in size. Pericardium: Trivial pericardial effusion is present. Mitral Valve: There is mild thickening of the mitral valve leaflet(s). Mild mitral annular calcification. Trivial mitral valve regurgitation. MV  peak gradient, 5.5 mmHg. The mean mitral valve gradient is 3.0 mmHg. Tricuspid Valve: The tricuspid valve is normal in structure. Tricuspid valve regurgitation is trivial. Aortic Valve: The aortic valve is normal in structure. Aortic valve regurgitation is not visualized. Aortic valve mean gradient measures 9.0 mmHg. Aortic valve peak gradient measures 15.7 mmHg. Aortic valve area, by VTI measures 2.31 cm. Pulmonic Valve: The pulmonic valve was normal in structure. Pulmonic valve regurgitation is not visualized. Aorta: The aortic root is normal in size and structure. IAS/Shunts: No atrial level shunt detected by color flow Doppler.  LEFT VENTRICLE PLAX 2D LVIDd:         4.90 cm      Diastology LVIDs:         3.32 cm      LV e' medial:    9.73 cm/s LV PW:         1.54 cm      LV E/e' medial:  7.6 LV IVS:        1.40 cm      LV e' lateral:   8.85 cm/s LVOT diam:     2.20 cm      LV E/e' lateral: 8.3 LV SV:         90 LV SV Index:   39 LVOT Area:     3.80 cm  LV Volumes (MOD) LV vol d, MOD A2C:  124.0 ml LV vol d, MOD A4C: 155.0 ml LV vol s, MOD A2C: 65.3 ml LV vol s, MOD A4C: 72.6 ml LV SV MOD A2C:     58.7 ml LV SV MOD A4C:     155.0 ml LV SV MOD BP:      71.8 ml RIGHT VENTRICLE RV Mid diam:    3.95 cm RV S prime:     15.10 cm/s LEFT ATRIUM             Index       RIGHT ATRIUM           Index LA diam:        4.70 cm 2.07 cm/m  RA Area:     17.00 cm LA Vol (A2C):   53.7 ml 23.64 ml/m RA Volume:   43.60 ml  19.19 ml/m LA Vol (A4C):   58.9 ml 25.93 ml/m LA Biplane Vol: 60.3 ml 26.55 ml/m  AORTIC VALVE AV Area (Vmax):    2.23 cm AV Area (Vmean):   2.23 cm AV Area (VTI):     2.31 cm AV Vmax:           198.00 cm/s AV Vmean:          130.000 cm/s AV VTI:            0.389 m AV Peak Grad:      15.7 mmHg AV Mean Grad:      9.0 mmHg LVOT Vmax:         116.00 cm/s LVOT Vmean:        76.300 cm/s LVOT VTI:          0.236 m LVOT/AV VTI ratio: 0.61  AORTA Ao Root diam: 3.30 cm Ao Asc diam:  3.70 cm MITRAL VALVE MV Area (PHT): 4.15 cm     SHUNTS MV Area VTI:   3.15 cm     Systemic VTI:  0.24 m MV Peak grad:  5.5 mmHg     Systemic Diam: 2.20 cm MV Mean grad:  3.0 mmHg MV Vmax:  1.17 m/s MV Vmean:      73.8 cm/s MV Decel Time: 183 msec MV E velocity: 73.70 cm/s MV A velocity: 102.00 cm/s MV E/A ratio:  0.72 Dorris Carnes MD Electronically signed by Dorris Carnes MD Signature Date/Time: 06/29/2021/6:27:44 PM    Final       Subjective: Patient is feeling better. No shortness of breath. No abdominal pain  Discharge Exam: Vitals:   07/01/21 1955 07/01/21 2354 07/02/21 0506 07/02/21 1257  BP: (!) 142/75 135/83 135/84 (!) 144/78  Pulse: 67 (!) 56 (!) 57 63  Resp: 19 19  18   Temp: 97.7 F (36.5 C) 97.6 F (36.4 C) 97.8 F (36.6 C) 97.7 F (36.5 C)  TempSrc: Oral Oral Oral Oral  SpO2: 96% 95%  96%  Weight:      Height:        General: Pt is alert, awake, not in acute distress Cardiovascular: RRR, S1/S2 +, no rubs, no gallops Respiratory: CTA bilaterally, no wheezing, no rhonchi Abdominal: Soft,  NT, ND, bowel sounds + Extremities: 1+ edema, no cyanosis    The results of significant diagnostics from this hospitalization (including imaging, microbiology, ancillary and laboratory) are listed below for reference.     Microbiology: Recent Results (from the past 240 hour(s))  SARS CORONAVIRUS 2 (TAT 6-24 HRS) Nasopharyngeal Nasopharyngeal Swab     Status: None   Collection Time: 06/28/21  8:31 PM   Specimen: Nasopharyngeal Swab  Result Value Ref Range Status   SARS Coronavirus 2 NEGATIVE NEGATIVE Final    Comment: (NOTE) SARS-CoV-2 target nucleic acids are NOT DETECTED.  The SARS-CoV-2 RNA is generally detectable in upper and lower respiratory specimens during the acute phase of infection. Negative results do not preclude SARS-CoV-2 infection, do not rule out co-infections with other pathogens, and should not be used as the sole basis for treatment or other patient management decisions. Negative results must be combined with clinical observations, patient history, and epidemiological information. The expected result is Negative.  Fact Sheet for Patients: SugarRoll.be  Fact Sheet for Healthcare Providers: https://www.woods-mathews.com/  This test is not yet approved or cleared by the [place] FDA and  has been authorized for detection and/or diagnosis of SARS-CoV-2 by FDA under an Emergency Use Authorization (EUA). This EUA will remain  in effect (meaning this test can be used) for the duration of the COVID-19 declaration under Se ction 564(b)(1) of the Act, 21 U.S.C. section 360bbb-3(b)(1), unless the authorization is terminated or revoked sooner.  Performed at [facility], [place] [address], [place], [place] [address]   Culture, Urine     Status: Abnormal   Collection Time: 06/29/21 11:43 AM   Specimen: Urine, Clean Catch  Result Value Ref Range Status   Specimen Description   Final    URINE, CLEAN CATCH Performed  at [facility], [address], [place], [place] [address]    Special Requests   Final    NONE Performed at [facility], [address] [place], [place] [address]    Culture 40,000 COLONIES/mL ESCHERICHIA COLI (A)  Final   Report Status 07/02/2021 FINAL  Final   Organism ID, Bacteria ESCHERICHIA COLI (A)  Final      Susceptibility   Escherichia coli - MIC*    AMPICILLIN <=2 SENSITIVE Sensitive     CEFAZOLIN <=4 SENSITIVE Sensitive     CEFEPIME <=0.12 SENSITIVE Sensitive     CEFTRIAXONE <=0.25 SENSITIVE Sensitive     CIPROFLOXACIN <=0.25 SENSITIVE Sensitive  GENTAMICIN <=1 SENSITIVE Sensitive     IMIPENEM <=0.25 SENSITIVE Sensitive     NITROFURANTOIN <=16 SENSITIVE Sensitive     TRIMETH/SULFA <=20 SENSITIVE Sensitive     AMPICILLIN/SULBACTAM <=2 SENSITIVE Sensitive     PIP/TAZO <=4 SENSITIVE Sensitive     * 40,000 COLONIES/mL ESCHERICHIA COLI  Resp Panel by RT-PCR (Flu A&B, Covid) Nasopharyngeal Swab     Status: None   Collection Time: 06/29/21 12:24 PM   Specimen: Nasopharyngeal Swab; Nasopharyngeal(NP) swabs in vial transport medium  Result Value Ref Range Status   SARS Coronavirus 2 by RT PCR NEGATIVE NEGATIVE Final    Comment: (NOTE) SARS-CoV-2 target nucleic acids are NOT DETECTED.  The SARS-CoV-2 RNA is generally detectable in upper respiratory specimens during the acute phase of infection. The lowest concentration of SARS-CoV-2 viral copies this assay can detect is 138 copies/mL. A negative result does not preclude SARS-Cov-2 infection and should not be used as the sole basis for treatment or other patient management decisions. A negative result may occur with  improper specimen collection/handling, submission of specimen other than nasopharyngeal swab, presence of viral mutation(s) within the areas targeted by this assay, and inadequate number of viral copies(<138 copies/mL). A negative result must be combined with clinical observations, patient history, and  epidemiological information. The expected result is Negative.  Fact Sheet for Patients:  EntrepreneurPulse.com.au  Fact Sheet for Healthcare Providers:  IncredibleEmployment.be  This test is no t yet approved or cleared by the [place] FDA and  has been authorized for detection and/or diagnosis of SARS-CoV-2 by FDA under an Emergency Use Authorization (EUA). This EUA will remain  in effect (meaning this test can be used) for the duration of the COVID-19 declaration under Section 564(b)(1) of the Act, 21 U.S.C.section 360bbb-3(b)(1), unless the authorization is terminated  or revoked sooner.       Influenza A by PCR NEGATIVE NEGATIVE Final   Influenza B by PCR NEGATIVE NEGATIVE Final    Comment: (NOTE) The Xpert Xpress SARS-CoV-2/FLU/RSV plus assay is intended as an aid in the diagnosis of influenza from Nasopharyngeal swab specimens and should not be used as a sole basis for treatment. Nasal washings and aspirates are unacceptable for Xpert Xpress SARS-CoV-2/FLU/RSV testing.  Fact Sheet for Patients: EntrepreneurPulse.com.au  Fact Sheet for Healthcare Providers: IncredibleEmployment.be  This test is not yet approved or cleared by the [place] FDA and has been authorized for detection and/or diagnosis of SARS-CoV-2 by FDA under an Emergency Use Authorization (EUA). This EUA will remain in effect (meaning this test can be used) for the duration of the COVID-19 declaration under Section 564(b)(1) of the Act, 21 U.S.C. section 360bbb-3(b)(1), unless the authorization is terminated or revoked.  Performed at [facility], [place] [address], [place], [place] [address]   MRSA Next Gen by PCR, Nasal     Status: None   Collection Time: 06/29/21  9:00 PM   Specimen: Nasal Mucosa; Nasal Swab  Result Value Ref Range Status   MRSA by PCR Next Gen NOT DETECTED NOT DETECTED Final    Comment:  (NOTE) The GeneXpert MRSA Assay (FDA approved for NASAL specimens only), is one component of a comprehensive MRSA colonization surveillance program. It is not intended to diagnose MRSA infection nor to guide or monitor treatment for MRSA infections. Test performance is not FDA approved in patients less than 67 years old. Performed at [facility], [address], [place], [place] [address]      Labs: BNP (last 3  results) Recent Labs    06/28/21 2021  BNP 081.4*   Basic Metabolic Panel: Recent Labs  Lab 06/28/21 1247 06/29/21 0339 06/30/21 0942 07/01/21 1003 07/02/21 0423  NA 137 137 136 134* 140  K 3.8 4.3 4.0 3.6 4.2  CL 102 101 105 104 104  CO2 26 24 22  20* 25  GLUCOSE 90 75 142* 245* 137*  BUN 35* 47* 60* 72* 75*  CREATININE 2.25* 2.73* 1.93* 1.79* 1.65*  CALCIUM 8.3* 8.0* 7.4* 7.6* 8.4*  MG 1.4*  --   --   --   --   PHOS  --   --   --  4.1 3.9   Liver Function Tests: Recent Labs  Lab 06/28/21 1247 06/29/21 0339 07/01/21 1003 07/02/21 0423  AST 31 27  --   --   ALT <5 22  --   --   ALKPHOS 116 57  --   --   BILITOT 1.8* 1.3*  --   --   PROT 6.5 6.1*  --   --   ALBUMIN 3.2* 3.2* 2.9* 3.1*   No results for input(s): LIPASE, AMYLASE in the last 168 hours. No results for input(s): AMMONIA in the last 168 hours. CBC: Recent Labs  Lab 06/28/21 1247 06/29/21 0339 06/30/21 0553 07/01/21 0207 07/02/21 0423  WBC 7.1 19.5* 13.5* 10.7* 16.8*  NEUTROABS 6.9  --   --   --   --   HGB 12.5* 11.2* 10.8* 10.6* 11.5*  HCT 36.6* 33.6* 32.2* 30.2* 34.2*  MCV 104.9* 105.7* 102.5* 101.3* 104.0*  PLT 229 215 168 162 187   Cardiac Enzymes: Recent Labs  Lab 06/28/21 1247  CKTOTAL 156   BNP: Invalid input(s): POCBNP CBG: No results for input(s): GLUCAP in the last 168 hours. D-Dimer No results for input(s): DDIMER in the last 72 hours. Hgb A1c No results for input(s): HGBA1C in the last 72 hours. Lipid Profile No results for input(s): CHOL, HDL, LDLCALC, TRIG,  CHOLHDL, LDLDIRECT in the last 72 hours. Thyroid function studies No results for input(s): TSH, T4TOTAL, T3FREE, THYROIDAB in the last 72 hours.  Invalid input(s): FREET3 Anemia work up No results for input(s): VITAMINB12, FOLATE, FERRITIN, TIBC, IRON, RETICCTPCT in the last 72 hours. Urinalysis    Component Value Date/Time   COLORURINE YELLOW 06/28/2021 1233   APPEARANCEUR TURBID (A) 06/28/2021 1233   LABSPEC 1.018 06/28/2021 1233   PHURINE 5.0 06/28/2021 1233   GLUCOSEU NEGATIVE 06/28/2021 1233   HGBUR MODERATE (A) 06/28/2021 1233   BILIRUBINUR NEGATIVE 06/28/2021 1233   KETONESUR 5 (A) 06/28/2021 1233   PROTEINUR 100 (A) 06/28/2021 1233   NITRITE NEGATIVE 06/28/2021 1233   LEUKOCYTESUR LARGE (A) 06/28/2021 1233   Sepsis Labs Invalid input(s): PROCALCITONIN,  WBC,  LACTICIDVEN Microbiology Recent Results (from the past 240 hour(s))  SARS CORONAVIRUS 2 (TAT 6-24 HRS) Nasopharyngeal Nasopharyngeal Swab     Status: None   Collection Time: 06/28/21  8:31 PM   Specimen: Nasopharyngeal Swab  Result Value Ref Range Status   SARS Coronavirus 2 NEGATIVE NEGATIVE Final    Comment: (NOTE) SARS-CoV-2 target nucleic acids are NOT DETECTED.  The SARS-CoV-2 RNA is generally detectable in upper and lower respiratory specimens during the acute phase of infection. Negative results do not preclude SARS-CoV-2 infection, do not rule out co-infections with other pathogens, and should not be used as the sole basis for treatment or other patient management decisions. Negative results must be combined with clinical observations, patient history, and epidemiological information. The  expected result is Negative.  Fact Sheet for Patients: SugarRoll.be  Fact Sheet for Healthcare Providers: https://www.woods-mathews.com/  This test is not yet approved or cleared by the [place] FDA and  has been authorized for detection and/or diagnosis of SARS-CoV-2  by FDA under an Emergency Use Authorization (EUA). This EUA will remain  in effect (meaning this test can be used) for the duration of the COVID-19 declaration under Se ction 564(b)(1) of the Act, 21 U.S.C. section 360bbb-3(b)(1), unless the authorization is terminated or revoked sooner.  Performed at [facility], [place] [address] [place], [place] [address]   Culture, Urine     Status: Abnormal   Collection Time: 06/29/21 11:43 AM   Specimen: Urine, Clean Catch  Result Value Ref Range Status   Specimen Description   Final    URINE, CLEAN CATCH Performed at [facility], [address] [place], [place] [address]    Special Requests   Final    NONE Performed at [facility], [address], [place], [place] [address]    Culture 40,000 COLONIES/mL ESCHERICHIA COLI (A)  Final   Report Status 07/02/2021 FINAL  Final   Organism ID, Bacteria ESCHERICHIA COLI (A)  Final      Susceptibility   Escherichia coli - MIC*    AMPICILLIN <=2 SENSITIVE Sensitive     CEFAZOLIN <=4 SENSITIVE Sensitive     CEFEPIME <=0.12 SENSITIVE Sensitive     CEFTRIAXONE <=0.25 SENSITIVE Sensitive     CIPROFLOXACIN <=0.25 SENSITIVE Sensitive     GENTAMICIN <=1 SENSITIVE Sensitive     IMIPENEM <=0.25 SENSITIVE Sensitive     NITROFURANTOIN <=16 SENSITIVE Sensitive     TRIMETH/SULFA <=20 SENSITIVE Sensitive     AMPICILLIN/SULBACTAM <=2 SENSITIVE Sensitive     PIP/TAZO <=4 SENSITIVE Sensitive     * 40,000 COLONIES/mL ESCHERICHIA COLI  Resp Panel by RT-PCR (Flu A&B, Covid) Nasopharyngeal Swab     Status: None   Collection Time: 06/29/21 12:24 PM   Specimen: Nasopharyngeal Swab; Nasopharyngeal(NP) swabs in vial transport medium  Result Value Ref Range Status   SARS Coronavirus 2 by RT PCR NEGATIVE NEGATIVE Final    Comment: (NOTE) SARS-CoV-2 target nucleic acids are NOT DETECTED.  The SARS-CoV-2 RNA is generally detectable in upper respiratory specimens during the acute phase of infection. The  lowest concentration of SARS-CoV-2 viral copies this assay can detect is 138 copies/mL. A negative result does not preclude SARS-Cov-2 infection and should not be used as the sole basis for treatment or other patient management decisions. A negative result may occur with  improper specimen collection/handling, submission of specimen other than nasopharyngeal swab, presence of viral mutation(s) within the areas targeted by this assay, and inadequate number of viral copies(<138 copies/mL). A negative result must be combined with clinical observations, patient history, and epidemiological information. The expected result is Negative.  Fact Sheet for Patients:  EntrepreneurPulse.com.au  Fact Sheet for Healthcare Providers:  IncredibleEmployment.be  This test is no t yet approved or cleared by the [place] FDA and  has been authorized for detection and/or diagnosis of SARS-CoV-2 by FDA under an Emergency Use Authorization (EUA). This EUA will remain  in effect (meaning this test can be used) for the duration of the COVID-19 declaration under Section 564(b)(1) of the Act, 21 U.S.C.section 360bbb-3(b)(1), unless the authorization is terminated  or revoked sooner.       Influenza A by PCR NEGATIVE NEGATIVE Final   Influenza B by PCR NEGATIVE NEGATIVE Final  Comment: (NOTE) The Xpert Xpress SARS-CoV-2/FLU/RSV plus assay is intended as an aid in the diagnosis of influenza from Nasopharyngeal swab specimens and should not be used as a sole basis for treatment. Nasal washings and aspirates are unacceptable for Xpert Xpress SARS-CoV-2/FLU/RSV testing.  Fact Sheet for Patients: EntrepreneurPulse.com.au  Fact Sheet for Healthcare Providers: IncredibleEmployment.be  This test is not yet approved or cleared by the [place] FDA and has been authorized for detection and/or diagnosis of SARS-CoV-2 by FDA under  an Emergency Use Authorization (EUA). This EUA will remain in effect (meaning this test can be used) for the duration of the COVID-19 declaration under Section 564(b)(1) of the Act, 21 U.S.C. section 360bbb-3(b)(1), unless the authorization is terminated or revoked.  Performed at [facility], [place] [address], [place], [place] [address]   MRSA Next Gen by PCR, Nasal     Status: None   Collection Time: 06/29/21  9:00 PM   Specimen: Nasal Mucosa; Nasal Swab  Result Value Ref Range Status   MRSA by PCR Next Gen NOT DETECTED NOT DETECTED Final    Comment: (NOTE) The GeneXpert MRSA Assay (FDA approved for NASAL specimens only), is one component of a comprehensive MRSA colonization surveillance program. It is not intended to diagnose MRSA infection nor to guide or monitor treatment for MRSA infections. Test performance is not FDA approved in patients less than 79 years old. Performed at [facility], [address] [place], [place] [address]      Time coordinating discharge: 69mins  SIGNED:   Kathie Dike, MD  [facility] 07/02/2021, 1:33 PM   If 7PM-7AM, please contact night-coverage [email or web address]

## 2021-07-02 NOTE — Care Management Important Message (Signed)
Important Message  Patient Details  Name: Sergio Schroeder MRN: [id number] Date of Birth: 1946/12/18   Medicare Important Message Given:  Yes     Gaetan Medal 07/02/2021, 1:29 PM

## 2021-07-02 NOTE — Progress Notes (Signed)
Nsg Discharge Note  Admit Date:  06/28/2021 Discharge date: 07/02/2021   Sergio Schroeder to be D/C'd Home per MD order.  AVS completed.  Copy for chart, and copy for patient signed, and dated. Patient/caregiver able to verbalize understanding.  Discharge Medication: Allergies as of 07/02/2021   No Known Allergies      Medication List     STOP taking these medications    hydrochlorothiazide 25 MG tablet Commonly known as: HYDRODIURIL   losartan 100 MG tablet Commonly known as: COZAAR       TAKE these medications    albuterol 108 (90 Base) MCG/ACT inhaler Commonly known as: VENTOLIN HFA Inhale 2 puffs into the lungs every 6 (six) hours as needed for wheezing or shortness of breath.   amLODipine 5 MG tablet Commonly known as: NORVASC Take 1 tablet (5 mg total) by mouth daily. Start taking on: July 03, 2021   aspirin 325 MG tablet Take 1 tablet (325 mg total) by mouth daily. Start taking on: July 03, 2021   cefdinir 300 MG capsule Commonly known as: OMNICEF Take 1 capsule (300 mg total) by mouth 2 (two) times daily.   tobramycin-dexamethasone ophthalmic solution Commonly known as: TOBRADEX   VITAMIN D PO Take 1 tablet by mouth daily.               Durable Medical Equipment  (From admission, onward)           Start     Ordered   07/01/21 1152  For home use only DME Walker rolling  Once       Question Answer Comment  Walker: With 5 Inch Wheels   Patient needs a walker to treat with the following condition Generalized weakness      07/01/21 1152            Discharge Assessment: Vitals:   07/02/21 0506 07/02/21 1257  BP: 135/84 (!) 144/78  Pulse: (!) 57 63  Resp:  18  Temp: 97.8 F (36.6 C) 97.7 F (36.5 C)  SpO2:  96%   Skin clean, dry and intact without evidence of skin break down, no evidence of skin tears noted. IV catheter discontinued intact. Site without signs and symptoms of complications - no redness or edema noted at insertion  site, patient denies c/o pain - only slight tenderness at site.  Dressing with slight pressure applied.  D/c Instructions-Education: Discharge instructions given to patient/family with verbalized understanding. D/c education completed with patient/family including follow up instructions, medication list, d/c activities limitations if indicated, with other d/c instructions as indicated by MD - patient able to verbalize understanding, all questions fully answered. Patient instructed to return to ED, call 911, or call MD for any changes in condition.  Patient escorted via [place], and D/C home via private auto.  Dorcas Mcmurray, LPN 2/77/4128 7:86 PM

## 2021-07-04 ENCOUNTER — Other Ambulatory Visit: Payer: Self-pay | Admitting: *Deleted

## 2021-07-05 ENCOUNTER — Other Ambulatory Visit: Payer: Self-pay

## 2021-07-05 DIAGNOSIS — I82819 Embolism and thrombosis of superficial veins of unspecified lower extremities: Secondary | ICD-10-CM

## 2021-07-11 ENCOUNTER — Encounter: Payer: Self-pay | Admitting: Vascular Surgery

## 2021-07-11 ENCOUNTER — Other Ambulatory Visit: Payer: Self-pay

## 2021-07-11 ENCOUNTER — Ambulatory Visit (HOSPITAL_COMMUNITY)
Admission: RE | Admit: 2021-07-11 | Discharge: 2021-07-11 | Disposition: A | Discharge status: Home / Self Care | Payer: Medicare Other | Source: Ambulatory Visit | Attending: Vascular Surgery | Admitting: Vascular Surgery

## 2021-07-11 ENCOUNTER — Ambulatory Visit (INDEPENDENT_AMBULATORY_CARE_PROVIDER_SITE_OTHER): Payer: Medicare Other | Admitting: Vascular Surgery

## 2021-07-11 VITALS — BP 145/82 | HR 79 | Temp 98.2°F | Wt 242.0 lb

## 2021-07-11 DIAGNOSIS — I82819 Embolism and thrombosis of superficial veins of unspecified lower extremities: Secondary | ICD-10-CM

## 2021-07-11 DIAGNOSIS — I82811 Embolism and thrombosis of superficial veins of right lower extremities: Secondary | ICD-10-CM

## 2021-07-11 NOTE — Progress Notes (Signed)
Patient name: Sergio Schroeder MRN: [id number] DOB: 1946/06/19 Sex: male  REASON FOR CONSULT: Superficial thrombophlebitis right leg  HPI: Sergio Schroeder is a 75 y.o. male, who recently became very dehydrated had a syncopal episode during exacerbation of a kidney stone.  He was noted at the time of his work-up to have a short segment of thrombosis of his right greater saphenous vein.  This was about July 10.  The patient has had no swelling or pain in the right leg.  He has no prior episodes.  He has no history of DVT.  He currently has no symptoms of swelling in the right leg.  He does develop swelling in both legs if he is on his feet all day.  This is easily controlled with compression stockings and leg elevation.  This is been going on for several years.  Other medical problems include hypertension which is well controlled.  Past Medical History:  Diagnosis Date   Atypical mole 09/09/2014   malignant spindle cell-right temple   Basal cell carcinoma 10/16/2017   top left sholder-txpbx   BCC (basal cell carcinoma) 04/21/2017   right side-cx1fu   BCC (basal cell carcinoma) 08/30/2014   back-cx28fu   BCC (basal cell carcinoma) 07/05/1994   left temple-excision   BCC (basal cell carcinoma) 03/20/2009   right neck-excision   BCC (basal cell carcinoma) 03/20/2009   mid chest superior-cx77fu   BCC (basal cell carcinoma) 03/20/2009   mid chest inferior-cx37fu   BCC (basal cell carcinoma) 10/06/2012   right chest-cx1fu   Colon polyps    GERD (gastroesophageal reflux disease)    H/O renal calculi    Hypertension    Melanoma ([place]) 08/04/2012   L lateral torso/left flank-tx Dr.Leshin   SCC (squamous cell carcinoma) 10/16/2017   right top scalp-mohs   SCC (squamous cell carcinoma) 10/16/2017   mid abdomen-txpbx   SCC (squamous cell carcinoma) 04/21/2018   lower lip-mohs   SCC (squamous cell carcinoma) 07/27/2018   right post scalp,left jawline,left temple, right temple,right  sideburn   SCC (squamous cell carcinoma) 12/28/2018   medial scalp, left post scalp   SCC (squamous cell carcinoma) 02/16/2020   left ear rim, left inner ear,left sideburn superior,left sideburn inferior,tip of nose   SCC (squamous cell carcinoma) 01/10/2016   right forehead-mohs, right inner wrist-cx61fu   SCC (squamous cell carcinoma) 03/21/2016   left abdomen   SCC (squamous cell carcinoma) 09/24/2016   left cheek superior,inferior left cheek,inferior left cheek medial,left sideburn,right temple superior,right temple inferior   SCC (squamous cell carcinoma) 12/12/2016   left inner cheek-txpbx   SCC (squamous cell carcinoma) 04/21/2017   right temple superior-mohs,right inner cheek, left cheek superior-mohs   SCC (squamous cell carcinoma) 01/12/2013   right cheek-cx56fu,right hand-clear   SCC (squamous cell carcinoma) 03/18/2013   right jawline-txpbx   SCC (squamous cell carcinoma) 11/23/2013   left inner cheek, right scalp, right forehead,right cheek-cx64fu   SCC (squamous cell carcinoma) 01/06/2014   right temple-mohs   SCC (squamous cell carcinoma) 08/30/2014   back-cx58fu, left cheek-cx35u-excision   SCC (squamous cell carcinoma) 01/25/2015   right temple,right temple inf, left cheek-cx71fu   SCC (squamous cell carcinoma) 09/09/2015   post scalp,right forhead lateral, right temple   SCC (squamous cell carcinoma) 03/20/2009   left temple   SCC (squamous cell carcinoma) 01/14/2011   left hairline, bridge of nose   SCC (squamous cell carcinoma) 10/07/2011   under right inner eye, left cheek near temple, right cheek  SCC (squamous cell carcinoma) 10/12/2015   frontal scalp   SCCA (squamous cell carcinoma) of skin 06/27/2020   Left Hand Posterior (well diff)   SCCA (squamous cell carcinoma) of skin 06/27/2020   Right Hand Posterior (well diff)   SCCA (squamous cell carcinoma) of skin 06/27/2020   Scalp (well diff)   SCCA (squamous cell carcinoma) of skin 06/27/2020    Right Ear Rim (well diff)   SCCA (squamous cell carcinoma) of skin 09/14/2020   well diff-mid forehead   SCCA (squamous cell carcinoma) of skin 09/14/2020   in situ-mid parietal scalp-anterior   SCCA (squamous cell carcinoma) of skin 09/14/2020   mid parietal scalp-posterior   Skin cancer    Squamous cell carcinoma of skin 10/16/2017   left top scalp-txpbx   Past Surgical History:  Procedure Laterality Date   BILIARY STENT PLACEMENT N/A 05/20/2013   Procedure: BILIARY STENT PLACEMENT;  Surgeon: Daneil Dolin, MD;  Location: [facility];  Service: Gastroenterology;  Laterality: N/A;   CHOLECYSTECTOMY N/A 9/37/9024   complicated by cystic duct stump leak and gb fossa abscess. Procedure: LAPAROSCOPIC CHOLECYSTECTOMY;  Surgeon: Jamesetta So, MD;  Location: [facility];  Service: General;  Laterality: N/A;   COLONOSCOPY  2004   Dr. Laural Golden: few scattered diverticula at sigmoid and transverse colon, polyps. path unknown.    COLONOSCOPY  July 2004   Dr. Gala Romney. Few scattered diverticula sigmoid and transverse colon, 2 small rectal polyps (path unavailable)   COLONOSCOPY N/A 09/21/2013   Procedure: COLONOSCOPY;  Surgeon: Daneil Dolin, MD;  Location: [facility];  Service: Endoscopy;  Laterality: N/A;  10:30 AM   CYSTOSCOPY     CYSTOSCOPY W/ URETERAL STENT PLACEMENT Left 06/29/2021   Procedure: CYSTOSCOPY WITH RETROGRADE PYELOGRAM/URETERAL STENT PLACEMENT;  Surgeon: Bjorn Loser, MD;  Location: [facility];  Service: Urology;  Laterality: Left;   ERCP N/A 05/20/2013   RMR: cystic duct stump leak s/p cholecystectomy. +choledocholithiasis, s/p sphinterotomy, stent placement, stone extraction   ERCP N/A 07/22/2013   RMR: S/P removal of biliary stent along with removal of common duct stones as described above, status post sphincterotomy balloon dilation and balloon dredging of the biliary tree Previously noted biliary leak sealed/Duodenal diverticulum   ESOPHAGOGASTRODUODENOSCOPY N/A 06/08/2013   [place]  esophageal erosions in the distal one half of the esophagus. 2 cm segment of markedly inflamed and swollen duodenal mucosa at the junction of the bulb and second portion. Significant encroachment on the lumen causing GOO. Bx: benign  Surgeon: Daneil Dolin, MD;  Location: [facility];  Service: Endoscopy;  Laterality: N/A;  WITH PHENERGAN 12.5 mg IV on call    EXTRACORPOREAL SHOCK WAVE LITHOTRIPSY     EYE SURGERY     EYE SURGERY Left 35 years ago   KIDNEY STONE SURGERY     MELANOMA EXCISION Left 08/13   [facility]   REMOVAL OF STONES N/A 07/22/2013   Procedure: REMOVAL OF common bile duct STONES;  Surgeon: Daneil Dolin, MD;  Location: [facility];  Service: Endoscopy;  Laterality: N/A;   SPHINCTEROTOMY N/A 05/20/2013   Procedure: SPHINCTEROTOMY;  Surgeon: Daneil Dolin, MD;  Location: [facility];  Service: Gastroenterology;  Laterality: N/A;   SPHINCTEROTOMY N/A 07/22/2013   Procedure: SPHINCTEROTOMY with dilation;  Surgeon: Daneil Dolin, MD;  Location: [facility];  Service: Endoscopy;  Laterality: N/A;   STENT REMOVAL N/A 07/22/2013   Procedure: Biliary STENT REMOVAL;  Surgeon: Daneil Dolin, MD;  Location: [facility];  Service: Endoscopy;  Laterality: N/A;    Family History  Problem Relation Age of Onset   Cancer Father    Cancer Brother    Colon cancer Neg Hx     SOCIAL HISTORY: Social History   Socioeconomic History   Marital status: Divorced    Spouse name: Not on file   Number of children: Not on file   Years of education: Not on file   Highest education level: Not on file  Occupational History   Not on file  Tobacco Use   Smoking status: Every Day    Packs/day: 0.50    Years: 50.00    Pack years: 25.00    Types: Cigarettes   Smokeless tobacco: Never  Vaping Use   Vaping Use: Former  Substance and Sexual Activity   Alcohol use: No    Alcohol/week: 0.0 standard drinks   Drug use: No    Types: Marijuana    Comment: none since April   Sexual activity: Yes    Birth  control/protection: None  Other Topics Concern   Not on file  Social History Narrative   Not on file   Social Determinants of Health   Financial Resource Strain: Not on file  Food Insecurity: Not on file  Transportation Needs: Not on file  Physical Activity: Not on file  Stress: Not on file  Social Connections: Not on file  Intimate Partner Violence: Not on file    No Known Allergies  Current Outpatient Medications  Medication Sig Dispense Refill   albuterol (VENTOLIN HFA) 108 (90 Base) MCG/ACT inhaler Inhale 2 puffs into the lungs every 6 (six) hours as needed for wheezing or shortness of breath. 8 g 2   amLODipine (NORVASC) 5 MG tablet Take 1 tablet (5 mg total) by mouth daily. 30 tablet 1   aspirin 325 MG tablet Take 1 tablet (325 mg total) by mouth daily. 30 tablet 1   cefdinir (OMNICEF) 300 MG capsule Take 1 capsule (300 mg total) by mouth 2 (two) times daily. 8 capsule 0   tobramycin-dexamethasone (TOBRADEX) ophthalmic solution      VITAMIN D PO Take 1 tablet by mouth daily.     No current facility-administered medications for this visit.    ROS:   General:  No weight loss, Fever, chills  HEENT: No recent headaches, no nasal bleeding, no visual changes, no sore throat  Neurologic: No dizziness, blackouts, seizures. No recent symptoms of stroke or mini- stroke. No recent episodes of slurred speech, or temporary blindness.  Cardiac: No recent episodes of chest pain/pressure, no shortness of breath at rest.  No shortness of breath with exertion.  Denies history of atrial fibrillation or irregular heartbeat  Vascular: No history of rest pain in feet.  No history of claudication.  No history of non-healing ulcer, No history of DVT   Pulmonary: No home oxygen, no productive cough, no hemoptysis,  No asthma or wheezing  Musculoskeletal:  [ ]  Arthritis, [ ]  Low back pain,  [ ]  Joint pain  Hematologic:No history of hypercoagulable state.  No history of easy bleeding.  No  history of anemia  Gastrointestinal: No hematochezia or melena,  No gastroesophageal reflux, no trouble swallowing  Urinary: [ ]  chronic Kidney disease, [ ]  on HD - [ ]  MWF or [ ]  TTHS, [ ]  Burning with urination, [ ]  Frequent urination, [ ]  Difficulty urinating;   Skin: No rashes  Psychological: No history of anxiety,  No history of depression   Physical Examination  Vitals:  07/11/21 1023  BP: (!) 145/82  Pulse: 79  Temp: 98.2 F (36.8 C)  TempSrc: Skin  SpO2: 97%  Weight: 242 lb (109.8 kg)    General:  Alert and oriented, no acute distress HEENT: Normal Neck: No JVD Cardiac: Regular Rate and Rhythm  Skin: No rash, no erythema no ulcer Extremity: No significant edema or tenderness bilaterally  musculoskeletal: No deformity or edema  Neurologic: Upper and lower extremity motor 5/5 and symmetric  DATA:  Patient had a repeat venous duplex of his right leg today which showed no evidence of DVT.  There is no propagation of the area of thrombus.  There is a very short segment in the calf portion of his greater saphenous vein.  It is chronic in appearance.  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   PLAN: We will continue to wear his compression stockings and elevate his legs to control swelling.  He will follow-up on an as-needed basis.   Ruta Hinds, MD [facility] Office: [phone]

## 2021-07-17 ENCOUNTER — Telehealth: Payer: Self-pay

## 2021-07-17 ENCOUNTER — Encounter: Payer: Medicare Other | Admitting: Vascular Surgery

## 2021-07-17 NOTE — Telephone Encounter (Signed)
Received a surgical posting sheet from Dr. Alyson Ingles. Requested surgery date for 08/04 and called and notified patient. Patient voiced understanding.

## 2021-07-20 NOTE — Patient Instructions (Signed)
Sergio Schroeder  07/20/2021     '@PREFPERIOPPHARMACY'$ @   Your procedure is scheduled on  07/26/2021.   Report to [name] at   [name]   Call this number if you have problems the morning of surgery:  [phone]   Remember:  Do not eat or drink after midnight.    Take these medicines the morning of surgery with A SIP OF WATER                                   amlodipine.    Do not wear jewelry, make-up or nail polish.  Do not wear lotions, powders, or perfumes, or deodorant.  Do not shave 48 hours prior to surgery.  Men may shave face and neck.  Do not bring valuables to the hospital.  [facility] is not responsible for any belongings or valuables.  Contacts, dentures or bridgework may not be worn into surgery.  Leave your suitcase in the car.  After surgery it may be brought to your room.  For patients admitted to the hospital, discharge time will be determined by your treatment team.  Patients discharged the day of surgery will not be allowed to drive home and must  have someone with them for 24 hours.    Special instructions:    DO NOT smoke tobacco or vape for 24 hours before your procedure.  Please read over the following fact sheets that you were given. Coughing and Deep Breathing, Surgical Site Infection Prevention, Anesthesia Post-op Instructions, and Care and Recovery After Surgery      Ureteral Stent Implantation, Care After This sheet gives you information about how to care for yourself after your procedure. Your health care provider may also give you more specific instructions. If you have problems or questions, contact your health careprovider. What can I expect after the procedure? After the procedure, it is common to have: Nausea. Mild pain when you urinate. You may feel this pain in your lower back or lower abdomen. The pain should stop within a few minutes after you urinate. This may last for up to 1 week. A small amount of blood in your  urine for several days. Follow these instructions at home: Medicines Take over-the-counter and prescription medicines only as told by your health care provider. If you were prescribed an antibiotic medicine, take it as told by your health care provider. Do not stop taking the antibiotic even if you start to feel better. Do not drive for 24 hours if you were given a sedative during your procedure. Ask your health care provider if the medicine prescribed to you requires you to avoid driving or using heavy machinery. Activity Rest as told by your health care provider. Avoid sitting for a long time without moving. Get up to take short walks every 1-2 hours. This is important to improve blood flow and breathing. Ask for help if you feel weak or unsteady. Return to your normal activities as told by your health care provider. Ask your health care provider what activities are safe for you. General instructions  Watch for any blood in your urine. Call your health care provider if the amount of blood in your urine increases. If you have a catheter: Follow instructions from your health care provider about taking care of your catheter and collection bag. Do not take baths, swim, or use a  hot tub until your health care provider approves. Ask your health care provider if you may take showers. You may only be allowed to take sponge baths. Drink enough fluid to keep your urine pale yellow. Do not use any products that contain nicotine or tobacco, such as cigarettes, e-cigarettes, and chewing tobacco. These can delay healing after surgery. If you need help quitting, ask your health care provider. Keep all follow-up visits as told by your health care provider. This is important.  Contact a health care provider if: You have pain that gets worse or does not get better with medicine, especially pain when you urinate. You have difficulty urinating. You feel nauseous or you vomit repeatedly during a period of more  than 2 days after the procedure. Get help right away if: Your urine is dark red or has blood clots in it. You are leaking urine (have incontinence). The end of the stent comes out of your urethra. You cannot urinate. You have sudden, sharp, or severe pain in your abdomen or lower back. You have a fever. You have swelling or pain in your legs. You have difficulty breathing. Summary After the procedure, it is common to have mild pain when you urinate that goes away within a few minutes after you urinate. This may last for up to 1 week. Watch for any blood in your urine. Call your health care provider if the amount of blood in your urine increases. Take over-the-counter and prescription medicines only as told by your health care provider. Drink enough fluid to keep your urine pale yellow. This information is not intended to replace advice given to you by your health care provider. Make sure you discuss any questions you have with your healthcare provider. Document Revised: 09/15/2018 Document Reviewed: 09/16/2018 Elsevier Patient Education  2022 [place] Anesthesia, Adult, Care After This sheet gives you information about how to care for yourself after your procedure. Your health care provider may also give you more specific instructions. If you have problems or questions, contact your health careprovider. What can I expect after the procedure? After the procedure, the following side effects are common: Pain or discomfort at the IV site. Nausea. Vomiting. Sore throat. Trouble concentrating. Feeling cold or chills. Feeling weak or tired. Sleepiness and fatigue. Soreness and body aches. These side effects can affect parts of the body that were not involved in surgery. Follow these instructions at home: For the time period you were told by your health care provider:  Rest. Do not participate in activities where you could fall or become injured. Do not drive or use  machinery. Do not drink alcohol. Do not take sleeping pills or medicines that cause drowsiness. Do not make important decisions or sign legal documents. Do not take care of children on your own.  Eating and drinking Follow any instructions from your health care provider about eating or drinking restrictions. When you feel hungry, start by eating small amounts of foods that are soft and easy to digest (bland), such as toast. Gradually return to your regular diet. Drink enough fluid to keep your urine pale yellow. If you vomit, rehydrate by drinking water, juice, or clear broth. General instructions If you have sleep apnea, surgery and certain medicines can increase your risk for breathing problems. Follow instructions from your health care provider about wearing your sleep device: Anytime you are sleeping, including during daytime naps. While taking prescription pain medicines, sleeping medicines, or medicines that make you drowsy. Have a responsible adult  stay with you for the time you are told. It is important to have someone help care for you until you are awake and alert. Return to your normal activities as told by your health care provider. Ask your health care provider what activities are safe for you. Take over-the-counter and prescription medicines only as told by your health care provider. If you smoke, do not smoke without supervision. Keep all follow-up visits as told by your health care provider. This is important. Contact a health care provider if: You have nausea or vomiting that does not get better with medicine. You cannot eat or drink without vomiting. You have pain that does not get better with medicine. You are unable to pass urine. You develop a skin rash. You have a fever. You have redness around your IV site that gets worse. Get help right away if: You have difficulty breathing. You have chest pain. You have blood in your urine or stool, or you vomit  blood. Summary After the procedure, it is common to have a sore throat or nausea. It is also common to feel tired. Have a responsible adult stay with you for the time you are told. It is important to have someone help care for you until you are awake and alert. When you feel hungry, start by eating small amounts of foods that are soft and easy to digest (bland), such as toast. Gradually return to your regular diet. Drink enough fluid to keep your urine pale yellow. Return to your normal activities as told by your health care provider. Ask your health care provider what activities are safe for you. This information is not intended to replace advice given to you by your health care provider. Make sure you discuss any questions you have with your healthcare provider. Document Revised: 08/24/2020 Document Reviewed: 03/23/2020 Elsevier Patient Education  2022 [place]. How to Use Chlorhexidine for Bathing Chlorhexidine gluconate (CHG) is a germ-killing (antiseptic) solution that is used to clean the skin. It can get rid of the bacteria that normally live on the skin and can keep them away for about 24 hours. To clean your skin with CHG, you may be given: A CHG solution to use in the shower or as part of a sponge bath. A prepackaged cloth that contains CHG. Cleaning your skin with CHG may help lower the risk for infection: While you are staying in the intensive care unit of the hospital. If you have a vascular access, such as a central line, to provide short-term or long-term access to your veins. If you have a catheter to drain urine from your bladder. If you are on a ventilator. A ventilator is a machine that helps you breathe by moving air in and out of your lungs. After surgery. What are the risks? Risks of using CHG include: A skin reaction. Hearing loss, if CHG gets in your ears. Eye injury, if CHG gets in your eyes and is not rinsed out. The CHG product catching fire. Make sure that  you avoid smoking and flames after applying CHG to your skin. Do not use CHG: If you have a chlorhexidine allergy or have previously reacted to chlorhexidine. On babies younger than 24 months of age. How to use CHG solution Use CHG only as told by your health care provider, and follow the instructions on the label. Use the full amount of CHG as directed. Usually, this is one bottle. During a shower Follow these steps when using CHG solution during a shower (  unless your health care provider gives you different instructions): Start the shower. Use your normal soap and shampoo to wash your face and hair. Turn off the shower or move out of the shower stream. Pour the CHG onto a clean washcloth. Do not use any type of brush or rough-edged sponge. Starting at your neck, lather your body down to your toes. Make sure you follow these instructions: If you will be having surgery, pay special attention to the part of your body where you will be having surgery. Scrub this area for at least 1 minute. Do not use CHG on your head or face. If the solution gets into your ears or eyes, rinse them well with water. Avoid your genital area. Avoid any areas of skin that have broken skin, cuts, or scrapes. Scrub your back and under your arms. Make sure to wash skin folds. Let the lather sit on your skin for 1-2 minutes or as long as told by your health care provider. Thoroughly rinse your entire body in the shower. Make sure that all body creases and crevices are rinsed well. Dry off with a clean towel. Do not put any substances on your body afterward--such as powder, lotion, or perfume--unless you are told to do so by your health care provider. Only use lotions that are recommended by the manufacturer. Put on clean clothes or pajamas. If it is the night before your surgery, sleep in clean sheets.  During a sponge bath Follow these steps when using CHG solution during a sponge bath (unless your health care  provider gives you different instructions): Use your normal soap and shampoo to wash your face and hair. Pour the CHG onto a clean washcloth. Starting at your neck, lather your body down to your toes. Make sure you follow these instructions: If you will be having surgery, pay special attention to the part of your body where you will be having surgery. Scrub this area for at least 1 minute. Do not use CHG on your head or face. If the solution gets into your ears or eyes, rinse them well with water. Avoid your genital area. Avoid any areas of skin that have broken skin, cuts, or scrapes. Scrub your back and under your arms. Make sure to wash skin folds. Let the lather sit on your skin for 1-2 minutes or as long as told by your health care provider. Using a different clean, wet washcloth, thoroughly rinse your entire body. Make sure that all body creases and crevices are rinsed well. Dry off with a clean towel. Do not put any substances on your body afterward--such as powder, lotion, or perfume--unless you are told to do so by your health care provider. Only use lotions that are recommended by the manufacturer. Put on clean clothes or pajamas. If it is the night before your surgery, sleep in clean sheets. How to use CHG prepackaged cloths Only use CHG cloths as told by your health care provider, and follow the instructions on the label. Use the CHG cloth on clean, dry skin. Do not use the CHG cloth on your head or face unless your health care provider tells you to. When washing with the CHG cloth: Avoid your genital area. Avoid any areas of skin that have broken skin, cuts, or scrapes. Before surgery Follow these steps when using a CHG cloth to clean before surgery (unless your health care provider gives you different instructions): Using the CHG cloth, vigorously scrub the part of your body where you will  be having surgery. Scrub using a back-and-forth motion for 3 minutes. The area on your body  should be completely wet with CHG when you are done scrubbing. Do not rinse. Discard the cloth and let the area air-dry. Do not put any substances on the area afterward, such as powder, lotion, or perfume. Put on clean clothes or pajamas. If it is the night before your surgery, sleep in clean sheets.  For general bathing Follow these steps when using CHG cloths for general bathing (unless your health care provider gives you different instructions). Use a separate CHG cloth for each area of your body. Make sure you wash between any folds of skin and between your fingers and toes. Wash your body in the following order, switching to a new cloth after each step: The front of your neck, shoulders, and chest. Both of your arms, under your arms, and your hands. Your stomach and groin area, avoiding the genitals. Your right leg and foot. Your left leg and foot. The back of your neck, your back, and your buttocks. Do not rinse. Discard the cloth and let the area air-dry. Do not put any substances on your body afterward--such as powder, lotion, or perfume--unless you are told to do so by your health care provider. Only use lotions that are recommended by the manufacturer. Put on clean clothes or pajamas. Contact a health care provider if: Your skin gets irritated after scrubbing. You have questions about using your solution or cloth. Get help right away if: Your eyes become very red or swollen. Your eyes itch badly. Your skin itches badly and is red or swollen. Your hearing changes. You have trouble seeing. You have swelling or tingling in your mouth or throat. You have trouble breathing. You swallow any chlorhexidine. Summary Chlorhexidine gluconate (CHG) is a germ-killing (antiseptic) solution that is used to clean the skin. Cleaning your skin with CHG may help to lower your risk for infection. You may be given CHG to use for bathing. It may be in a bottle or in a prepackaged cloth to use on  your skin. Carefully follow your health care provider's instructions and the instructions on the product label. Do not use CHG if you have a chlorhexidine allergy. Contact your health care provider if your skin gets irritated after scrubbing. This information is not intended to replace advice given to you by your health care provider. Make sure you discuss any questions you have with your healthcare provider. Document Revised: 04/21/2020 Document Reviewed: 05/26/2020 Elsevier Patient Education  [place].

## 2021-07-24 ENCOUNTER — Encounter (HOSPITAL_COMMUNITY)
Admission: RE | Admit: 2021-07-24 | Discharge: 2021-07-24 | Disposition: A | Discharge status: Home / Self Care | Payer: Medicare Other | Source: Ambulatory Visit | Attending: Urology | Admitting: Urology

## 2021-07-24 ENCOUNTER — Other Ambulatory Visit: Payer: Self-pay

## 2021-07-26 ENCOUNTER — Encounter (HOSPITAL_COMMUNITY)
Admission: RE | Disposition: A | Discharge status: Home / Self Care | Payer: Self-pay | Source: Home / Self Care | Attending: Urology

## 2021-07-26 ENCOUNTER — Ambulatory Visit (HOSPITAL_COMMUNITY): Payer: Medicare Other

## 2021-07-26 ENCOUNTER — Ambulatory Visit (HOSPITAL_COMMUNITY): Payer: Medicare Other | Admitting: Anesthesiology

## 2021-07-26 ENCOUNTER — Encounter (HOSPITAL_COMMUNITY): Payer: Self-pay | Admitting: Urology

## 2021-07-26 ENCOUNTER — Ambulatory Visit (HOSPITAL_COMMUNITY)
Admission: RE | Admit: 2021-07-26 | Discharge: 2021-07-26 | Disposition: A | Discharge status: Home / Self Care | Payer: Medicare Other | Attending: Urology | Admitting: Urology

## 2021-07-26 DIAGNOSIS — N1330 Unspecified hydronephrosis: Secondary | ICD-10-CM | POA: Diagnosis present

## 2021-07-26 DIAGNOSIS — F17210 Nicotine dependence, cigarettes, uncomplicated: Secondary | ICD-10-CM | POA: Diagnosis not present

## 2021-07-26 DIAGNOSIS — N132 Hydronephrosis with renal and ureteral calculous obstruction: Secondary | ICD-10-CM | POA: Diagnosis not present

## 2021-07-26 DIAGNOSIS — Z87442 Personal history of urinary calculi: Secondary | ICD-10-CM | POA: Diagnosis not present

## 2021-07-26 DIAGNOSIS — N200 Calculus of kidney: Secondary | ICD-10-CM | POA: Diagnosis not present

## 2021-07-26 DIAGNOSIS — Z85820 Personal history of malignant melanoma of skin: Secondary | ICD-10-CM | POA: Diagnosis not present

## 2021-07-26 HISTORY — PX: CYSTOSCOPY WITH RETROGRADE PYELOGRAM, URETEROSCOPY AND STENT PLACEMENT: SHX5789

## 2021-07-26 HISTORY — PX: HOLMIUM LASER APPLICATION: SHX5852

## 2021-07-26 SURGERY — CYSTOURETEROSCOPY, WITH RETROGRADE PYELOGRAM AND STENT INSERTION
Anesthesia: General | Laterality: Left

## 2021-07-26 MED ORDER — SODIUM CHLORIDE 0.9 % IV SOLN
2.0000 g | INTRAVENOUS | Status: AC
  Administered 2021-07-26: 2 g via INTRAVENOUS
  Filled 2021-07-26: qty 20

## 2021-07-26 MED ORDER — PROPOFOL 10 MG/ML IV BOLUS
INTRAVENOUS | Status: AC
  Filled 2021-07-26: qty 40

## 2021-07-26 MED ORDER — OXYCODONE-ACETAMINOPHEN 5-325 MG PO TABS: 1.0000 | ORAL_TABLET | ORAL | 0 refills | Status: DC | PRN

## 2021-07-26 MED ORDER — ONDANSETRON HCL 4 MG/2ML IJ SOLN
INTRAMUSCULAR | Status: AC
  Filled 2021-07-26: qty 2

## 2021-07-26 MED ORDER — SODIUM CHLORIDE 0.9 % IR SOLN
Status: DC | PRN
  Administered 2021-07-26: 3000 mL

## 2021-07-26 MED ORDER — DEXAMETHASONE SODIUM PHOSPHATE 10 MG/ML IJ SOLN
INTRAMUSCULAR | Status: AC
  Filled 2021-07-26: qty 1

## 2021-07-26 MED ORDER — ONDANSETRON HCL 4 MG/2ML IJ SOLN
INTRAMUSCULAR | Status: DC | PRN
  Administered 2021-07-26: 4 mg via INTRAVENOUS

## 2021-07-26 MED ORDER — FENTANYL CITRATE (PF) 100 MCG/2ML IJ SOLN
INTRAMUSCULAR | Status: AC
  Filled 2021-07-26: qty 2

## 2021-07-26 MED ORDER — DEXAMETHASONE SODIUM PHOSPHATE 10 MG/ML IJ SOLN
INTRAMUSCULAR | Status: DC | PRN
  Administered 2021-07-26: 8 mg via INTRAVENOUS

## 2021-07-26 MED ORDER — METOCLOPRAMIDE HCL 5 MG/ML IJ SOLN
INTRAMUSCULAR | Status: DC | PRN
  Administered 2021-07-26: 5 mg via INTRAVENOUS

## 2021-07-26 MED ORDER — LIDOCAINE HCL (CARDIAC) PF 50 MG/5ML IV SOSY
PREFILLED_SYRINGE | INTRAVENOUS | Status: DC | PRN
  Administered 2021-07-26: 50 mg via INTRAVENOUS

## 2021-07-26 MED ORDER — WATER FOR IRRIGATION, STERILE IR SOLN
Status: DC | PRN
  Administered 2021-07-26: 1000 mL

## 2021-07-26 MED ORDER — DIATRIZOATE MEGLUMINE 30 % UR SOLN
URETHRAL | Status: AC
  Filled 2021-07-26: qty 100

## 2021-07-26 MED ORDER — LACTATED RINGERS IV SOLN
INTRAVENOUS | Status: DC
  Administered 2021-07-26: 1000 mL via INTRAVENOUS

## 2021-07-26 MED ORDER — METOCLOPRAMIDE HCL 5 MG/ML IJ SOLN
INTRAMUSCULAR | Status: AC
  Filled 2021-07-26: qty 2

## 2021-07-26 MED ORDER — LIDOCAINE HCL (PF) 2 % IJ SOLN
INTRAMUSCULAR | Status: AC
  Filled 2021-07-26: qty 5

## 2021-07-26 MED ORDER — CHLORHEXIDINE GLUCONATE 0.12 % MT SOLN
15.0000 mL | Freq: Once | OROMUCOSAL | Status: AC
  Administered 2021-07-26: 15 mL via OROMUCOSAL
  Filled 2021-07-26: qty 15

## 2021-07-26 MED ORDER — PROPOFOL 10 MG/ML IV BOLUS
INTRAVENOUS | Status: DC | PRN
  Administered 2021-07-26: 170 mg via INTRAVENOUS
  Administered 2021-07-26: 30 mg via INTRAVENOUS

## 2021-07-26 MED ORDER — ORAL CARE MOUTH RINSE: 15.0000 mL | Freq: Once | OROMUCOSAL | Status: AC

## 2021-07-26 MED ORDER — FENTANYL CITRATE (PF) 100 MCG/2ML IJ SOLN
INTRAMUSCULAR | Status: DC | PRN
  Administered 2021-07-26: 50 ug via INTRAVENOUS

## 2021-07-26 MED ORDER — DIATRIZOATE MEGLUMINE 30 % UR SOLN
URETHRAL | Status: DC | PRN
  Administered 2021-07-26: 7 mL via URETHRAL

## 2021-07-26 SURGICAL SUPPLY — 26 items
BAG DRAIN URO TABLE W/ADPT NS (BAG) ×2 IMPLANT
BAG DRN 8 ADPR NS SKTRN CSTL (BAG) ×1
BAG HAMPER (MISCELLANEOUS) ×2 IMPLANT
CATH INTERMIT  6FR 70CM (CATHETERS) ×2 IMPLANT
CLOTH BEACON ORANGE TIMEOUT ST (SAFETY) ×2 IMPLANT
DECANTER SPIKE VIAL GLASS SM (MISCELLANEOUS) ×2 IMPLANT
EXTRACTOR STONE NITINOL NGAGE (UROLOGICAL SUPPLIES) ×1 IMPLANT
GLOVE BIO SURGEON STRL SZ8 (GLOVE) ×2 IMPLANT
GLOVE SURG UNDER POLY LF SZ7 (GLOVE) ×4 IMPLANT
GOWN STRL REUS W/TWL LRG LVL3 (GOWN DISPOSABLE) ×2 IMPLANT
GOWN STRL REUS W/TWL XL LVL3 (GOWN DISPOSABLE) ×2 IMPLANT
GUIDEWIRE STR DUAL SENSOR (WIRE) ×2 IMPLANT
GUIDEWIRE STR ZIPWIRE 035X150 (MISCELLANEOUS) ×2 IMPLANT
IV NS IRRIG 3000ML ARTHROMATIC (IV SOLUTION) ×4 IMPLANT
KIT TURNOVER CYSTO (KITS) ×2 IMPLANT
MANIFOLD NEPTUNE II (INSTRUMENTS) ×2 IMPLANT
PACK CYSTO (CUSTOM PROCEDURE TRAY) ×2 IMPLANT
PAD ARMBOARD 7.5X6 YLW CONV (MISCELLANEOUS) ×2 IMPLANT
SHEATH URETERAL 12FRX35CM (MISCELLANEOUS) ×1 IMPLANT
STENT URET 6FRX26 CONTOUR (STENTS) ×1 IMPLANT
SYR 10ML LL (SYRINGE) ×2 IMPLANT
SYR CONTROL 10ML LL (SYRINGE) ×2 IMPLANT
TOWEL OR 17X26 4PK STRL BLUE (TOWEL DISPOSABLE) ×2 IMPLANT
TRACTIP FLEXIVA PULS ID 200XHI (Laser) IMPLANT
TRACTIP FLEXIVA PULSE ID 200 (Laser) ×2
WATER STERILE IRR 500ML POUR (IV SOLUTION) ×2 IMPLANT

## 2021-07-26 NOTE — Anesthesia Procedure Notes (Signed)
Procedure Name: LMA Insertion Date/Time: 07/26/2021 7:46 AM Performed by: Vista Deck, CRNA Pre-anesthesia Checklist: Patient identified, Patient being monitored, Emergency Drugs available, Timeout performed and Suction available Patient Re-evaluated:Patient Re-evaluated prior to induction Oxygen Delivery Method: Circle System Utilized Preoxygenation: Pre-oxygenation with 100% oxygen Induction Type: IV induction Ventilation: Mask ventilation without difficulty LMA: LMA inserted LMA Size: 4.0 Number of attempts: 1 Placement Confirmation: positive ETCO2 and breath sounds checked- equal and bilateral Tube secured with: Tape Dental Injury: Teeth and Oropharynx as per pre-operative assessment

## 2021-07-26 NOTE — Op Note (Signed)
.  Preoperative diagnosis: Left ureteral stone  Postoperative diagnosis: Same  Procedure: 1 cystoscopy 2. Left retrograde pyelography 3.  Intraoperative fluoroscopy, under one hour, with interpretation 4.  Left ureteroscopic stone manipulation with laser lithotripsy 5.  Left 6 x 26 JJ stent placement  Attending: Nicolette Bang  Anesthesia: General  Estimated blood loss: None  Drains: Left 6 x 26 JJ ureteral stent with tether  Specimens: stone for analysis  Antibiotics: rocephin  Findings: left lower pole stone. No hydronephrosis. No masses/lesions in the bladder. Ureteral orifices in normal anatomic location.  Indications: Patient is a 75 year old male with a history of left ureteral stone who underwent left stent placement 3 weeks ago..  After discussing treatment options, he decided proceed with left ureteroscopic stone manipulation.  Procedure in detail: The patient was brought to the operating room and a brief timeout was done to ensure correct patient, correct procedure, correct site.  General anesthesia was administered patient was placed in dorsal lithotomy position.  His genitalia was then prepped and draped in usual sterile fashion.  A rigid 18 French cystoscope was passed in the urethra and the bladder.  Bladder was inspected free masses or lesions.  the ureteral orifices were in the normal orthotopic locations.  a 6 french ureteral catheter was then instilled into the left ureteral orifice.  a gentle retrograde was obtained and findings noted above. Using a grasper the left ureteral stent was brought to the urethral meatus.  we then placed a zip wire through the ureteral stent and advanced up to the renal pelvis. We then removed the stent. we then removed the cystoscope and cannulated the left ureteral orifice with a semirigid ureteroscope.  No stone was found in the ureter. Once we reached the UPJ a sensor wire was advanced in to the renal pelvis. We then removed the  ureteroscope and advanced am 12/14 x 35cm access sheath up to the renal pelvis. We then used the flexible ureteroscope to perform nephroscopy. We encountered the stone in the lower pole.    Using a 242nm laser fiber the stone was fragmented. the fragments were then removed with a Ngage basket.    once all stone fragments were removed we then removed the access sheath under direct vision and noted no injury to the ureter. We then placed a 6 x 26 double-j ureteral stent over the original zip wire.  We then removed the wire and good coil was noted in the the renal pelvis under fluoroscopy and the bladder under direct vision. the bladder was then drained and this concluded the procedure which was well tolerated by patient.  Complications: None  Condition: Stable, extubated, transferred to PACU  Plan: Patient is to be discharged home as to follow-up in one week. He is to remove his stent in 72 hours by pulling the tether

## 2021-07-26 NOTE — Anesthesia Postprocedure Evaluation (Signed)
Anesthesia Post Note  Patient: Manases L Rog  Procedure(s) Performed: CYSTOSCOPY WITH RETROGRADE PYELOGRAM, URETEROSCOPY AND STENT EXCHANGE (Left) HOLMIUM LASER APPLICATION (Left)  Patient location during evaluation: PACU Anesthesia Type: General Level of consciousness: awake and alert and oriented Pain management: pain level controlled Vital Signs Assessment: post-procedure vital signs reviewed and stable Respiratory status: spontaneous breathing and respiratory function stable Cardiovascular status: blood pressure returned to baseline and stable Postop Assessment: no apparent nausea or vomiting Anesthetic complications: no   No notable events documented.   Last Vitals:  Vitals:   07/26/21 0845 07/26/21 0902  BP: 129/72 (!) 150/70  Pulse: 70 90  Resp: (!) 25 (!) 26  Temp:  36.5 C  SpO2: 94% 96%    Last Pain:  Vitals:   07/26/21 0902  TempSrc: Oral  PainSc: 3                  Silver Parkey C Zayna Toste

## 2021-07-26 NOTE — Anesthesia Preprocedure Evaluation (Signed)
Anesthesia Evaluation  Patient identified by MRN, date of birth, ID band Patient awake    Reviewed: Allergy & Precautions, NPO status , Patient's Chart, lab work & pertinent test results  Airway Mallampati: III  TM Distance: >3 FB Neck ROM: Full    Dental  (+) Dental Advisory Given, Missing   Pulmonary COPD, Current Smoker,    breath sounds clear to auscultation (-) decreased breath sounds(-) wheezing      Cardiovascular Exercise Tolerance: Good hypertension, Pt. on medications  Rhythm:Regular Rate:Normal  1. Poor acoustic windows make evaluation of regional wall motion difficult. NO definite wall motion abnormalities . Left ventricular ejection fraction, by estimation, is 55 to 60%. The left ventricle has normal function. There is mild left ventricular hypertrophy. Left ventricular diastolic parameters were normal.  2. Right ventricular systolic function is normal. The right ventricular size is normal.  3. Trivial mitral valve regurgitation.  4. The aortic valve is normal in structure. Aortic valve regurgitation is  not visualized.    Neuro/Psych negative neurological ROS  negative psych ROS   GI/Hepatic GERD  Controlled,  Endo/Other  negative endocrine ROS  Renal/GU Renal InsufficiencyRenal disease     Musculoskeletal   Abdominal   Peds  Hematology  (+) anemia ,   Anesthesia Other Findings Multiple basal/squamous cell carcinoma resections   Reproductive/Obstetrics                            Anesthesia Physical  Anesthesia Plan  ASA: 2  Anesthesia Plan: General   Post-op Pain Management:    Induction: Intravenous  PONV Risk Score and Plan: 3 and Ondansetron and Dexamethasone  Airway Management Planned: LMA and Oral ETT  Additional Equipment:   Intra-op Plan:   Post-operative Plan: Extubation in OR  Informed Consent: I have reviewed the patients History and Physical,  chart, labs and discussed the procedure including the risks, benefits and alternatives for the proposed anesthesia with the patient or authorized representative who has indicated his/her understanding and acceptance.     Dental advisory given  Plan Discussed with: CRNA and Surgeon  Anesthesia Plan Comments:        Anesthesia Quick Evaluation

## 2021-07-26 NOTE — Interval H&P Note (Signed)
History and Physical Interval Note:  07/26/2021 7:32 AM  Sergio Schroeder  has presented today for surgery, with the diagnosis of left ureteral calculus.  The various methods of treatment have been discussed with the patient and family. After consideration of risks, benefits and other options for treatment, the patient has consented to  Procedure(s): CYSTOSCOPY WITH RETROGRADE PYELOGRAM, URETEROSCOPY AND STENT EXCHANGE (Left) HOLMIUM LASER APPLICATION (Left) as a surgical intervention.  The patient's history has been reviewed, patient examined, no change in status, stable for surgery.  I have reviewed the patient's chart and labs.  Questions were answered to the patient's satisfaction.     Nicolette Bang

## 2021-07-26 NOTE — Transfer of Care (Signed)
Immediate Anesthesia Transfer of Care Note  Patient: Sergio Schroeder  Procedure(s) Performed: CYSTOSCOPY WITH RETROGRADE PYELOGRAM, URETEROSCOPY AND STENT EXCHANGE (Left) HOLMIUM LASER APPLICATION (Left)  Patient Location: PACU  Anesthesia Type:General  Level of Consciousness: awake and patient cooperative  Airway & Oxygen Therapy: Patient Spontanous Breathing and Patient connected to nasal cannula oxygen  Post-op Assessment: Report given to RN and Post -op Vital signs reviewed and stable  Post vital signs: Reviewed and stable  Last Vitals:  Vitals Value Taken Time  BP 117/74 07/26/21 0840  Temp 97.6   Pulse 72 07/26/21 0840  Resp 24 07/26/21 0840  SpO2 91 % 07/26/21 0840  Vitals shown include unvalidated device data.  Last Pain:  Vitals:   07/26/21 0649  TempSrc: Oral  PainSc: 0-No pain      Patients Stated Pain Goal: 6 ([id number])  Complications: No notable events documented.

## 2021-07-27 ENCOUNTER — Encounter (HOSPITAL_COMMUNITY): Payer: Self-pay | Admitting: Urology

## 2021-08-01 LAB — CALCULI, WITH PHOTOGRAPH (CLINICAL LAB)
Calcium Oxalate Monohydrate: 80 %
Uric Acid Calculi: 20 %
Weight Calculi: 28 mg

## 2021-08-03 ENCOUNTER — Ambulatory Visit (INDEPENDENT_AMBULATORY_CARE_PROVIDER_SITE_OTHER): Payer: Medicare Other | Admitting: Urology

## 2021-08-03 ENCOUNTER — Other Ambulatory Visit: Payer: Self-pay

## 2021-08-03 ENCOUNTER — Encounter: Payer: Self-pay | Admitting: Urology

## 2021-08-03 VITALS — BP 146/82 | HR 86

## 2021-08-03 DIAGNOSIS — N201 Calculus of ureter: Secondary | ICD-10-CM

## 2021-08-03 DIAGNOSIS — N3000 Acute cystitis without hematuria: Secondary | ICD-10-CM | POA: Diagnosis not present

## 2021-08-03 LAB — URINALYSIS, ROUTINE W REFLEX MICROSCOPIC
Bilirubin, UA: NEGATIVE
Glucose, UA: NEGATIVE
Nitrite, UA: NEGATIVE
RBC, UA: NEGATIVE
Specific Gravity, UA: 1.015 (ref 1.005–1.030)
Urobilinogen, Ur: 0.2 mg/dL (ref 0.2–1.0)
pH, UA: 5.5 (ref 5.0–7.5)

## 2021-08-03 LAB — MICROSCOPIC EXAMINATION
RBC, Urine: NONE SEEN /hpf (ref 0–2)
Renal Epithel, UA: NONE SEEN /hpf

## 2021-08-03 MED ORDER — CIPROFLOXACIN HCL 500 MG PO TABS: 500.0000 mg | ORAL_TABLET | Freq: Once | ORAL | Status: DC

## 2021-08-03 MED ORDER — NITROFURANTOIN MONOHYD MACRO 100 MG PO CAPS: 100.0000 mg | ORAL_CAPSULE | Freq: Two times a day (BID) | ORAL | 0 refills | Status: DC

## 2021-08-03 NOTE — Progress Notes (Signed)

## 2021-08-03 NOTE — Patient Instructions (Signed)
Textbook of Natural Medicine (5th ed., pp. 1518-1527.e3). [place], [place]: Elsevier.">  Dietary Guidelines to Help Prevent Kidney Stones Kidney stones are deposits of minerals and salts that form inside your kidneys. Your risk of developing kidney stones may be greater depending on your diet, your lifestyle, the medicines you take, and whether you have certain medical conditions. Most people can lower their chances of developing kidney stones by following the instructions below. Your dietitian may give you more specific instructions depending on your overall health and the type of kidney stones youtend to develop. What are tips for following this plan? Reading food labels  Choose foods with "no salt added" or "low-salt" labels. Limit your salt (sodium) intake to less than 1,500 mg a day. Choose foods with calcium for each meal and snack. Try to eat about 300 mg of calcium at each meal. Foods that contain 200-500 mg of calcium a serving include: 8 oz (237 mL) of milk, calcium-fortifiednon-dairy milk, and calcium-fortifiedfruit juice. Calcium-fortified means that calcium has been added to these drinks. 8 oz (237 mL) of kefir, yogurt, and soy yogurt. 4 oz (114 g) of tofu. 1 oz (28 g) of cheese. 1 cup (150 g) of dried figs. 1 cup (91 g) of cooked broccoli. One 3 oz (85 g) can of sardines or mackerel. Most people need 1,000-1,500 mg of calcium a day. Talk to your dietitian abouthow much calcium is recommended for you. Shopping Buy plenty of fresh fruits and vegetables. Most people do not need to avoid fruits and vegetables, even if these foods contain nutrients that may contribute to kidney stones. When shopping for convenience foods, choose: Whole pieces of fruit. Pre-made salads with dressing on the side. Low-fat fruit and yogurt smoothies. Avoid buying frozen meals or prepared deli foods. These can be high in sodium. Look for foods with live cultures, such as yogurt and kefir. Choose high-fiber  grains, such as whole-wheat breads, oat bran, and wheat cereals. Cooking Do not add salt to food when cooking. Place a salt shaker on the table and allow each person to add his or her own salt to taste. Use vegetable protein, such as beans, textured vegetable protein (TVP), or tofu, instead of meat in pasta, casseroles, and soups. Meal planning Eat less salt, if told by your dietitian. To do this: Avoid eating processed or pre-made food. Avoid eating fast food. Eat less animal protein, including cheese, meat, poultry, or fish, if told by your dietitian. To do this: Limit the number of times you have meat, poultry, fish, or cheese each week. Eat a diet free of meat at least 2 days a week. Eat only one serving each day of meat, poultry, fish, or seafood. When you prepare animal protein, cut pieces into small portion sizes. For most meat and fish, one serving is about the size of the palm of your hand. Eat at least five servings of fresh fruits and vegetables each day. To do this: Keep fruits and vegetables on hand for snacks. Eat one piece of fruit or a handful of berries with breakfast. Have a salad and fruit at lunch. Have two kinds of vegetables at dinner. Limit foods that are high in a substance called oxalate. These include: Spinach (cooked), rhubarb, beets, sweet potatoes, and Swiss chard. Peanuts. Potato chips, french fries, and baked potatoes with skin on. Nuts and nut products. Chocolate. If you regularly take a diuretic medicine, make sure to eat at least 1 or 2 servings of fruits or vegetables that are   high in potassium each day. These include: Avocado. Banana. Orange, prune, carrot, or tomato juice. Baked potato. Cabbage. Beans and split peas. Lifestyle  Drink enough fluid to keep your urine pale yellow. This is the most important thing you can do. Spread your fluid intake throughout the day. If you drink alcohol: Limit how much you use to: 0-1 drink a day for women who  are not pregnant. 0-2 drinks a day for men. Be aware of how much alcohol is in your drink. In the [place] one drink equals one 12 oz bottle of beer (355 mL), one 5 oz glass of wine (148 mL), or one 1 oz glass of hard liquor (44 mL). Lose weight if told by your health care provider. Work with your dietitian to find an eating plan and weight loss strategies that work best for you.  General information Talk to your health care provider and dietitian about taking daily supplements. You may be told the following depending on your health and the cause of your kidney stones: Not to take supplements with vitamin C. To take a calcium supplement. To take a daily probiotic supplement. To take other supplements such as magnesium, fish oil, or vitamin B6. Take over-the-counter and prescription medicines only as told by your health care provider. These include supplements. What foods should I limit? Limit your intake of the following foods, or eat them as told by your dietitian. Vegetables Spinach. Rhubarb. Beets. Canned vegetables. Pickles. Olives. Baked potatoeswith skin. Grains Wheat bran. Baked goods. Salted crackers. Cereals high in sugar. Meats and other proteins Nuts. Nut butters. Large portions of meat, poultry, or fish. Salted, precooked,or cured meats, such as sausages, meat loaves, and hot dogs. Dairy Cheese. Beverages Regular soft drinks. Regular vegetable juice. Seasonings and condiments Seasoning blends with salt. Salad dressings. Soy sauce. Ketchup. Barbecue sauce. Other foods Canned soups. Canned pasta sauce. Casseroles. Pizza. Lasagna. Frozen meals.Potato chips. French fries. The items listed above may not be a complete list of foods and beverages you should limit. Contact a dietitian for more information. What foods should I avoid? Talk to your dietitian about specific foods you should avoid based on the typeof kidney stones you have and your overall health. Fruits Grapefruit. The  item listed above may not be a complete list of foods and beverages you should avoid. Contact a dietitian for more information. Summary Kidney stones are deposits of minerals and salts that form inside your kidneys. You can lower your risk of kidney stones by making changes to your diet. The most important thing you can do is drink enough fluid. Drink enough fluid to keep your urine pale yellow. Talk to your dietitian about how much calcium you should have each day, and eat less salt and animal protein as told by your dietitian. This information is not intended to replace advice given to you by your health care provider. Make sure you discuss any questions you have with your healthcare provider. Document Revised: 12/02/2019 Document Reviewed: 12/02/2019 Elsevier Patient Education  2022 Elsevier Inc.  

## 2021-08-03 NOTE — Progress Notes (Signed)
08/03/2021 11:16 AM   Sergio Schroeder Nov 20, 1946 [id number]  Referring provider: Jani Gravel, MD [place],  [place] [address]  Followup nephrolithiasis   HPI: [name] is a 75yo here for followup for nephrolithiasis. He underwent left ureteroscopic stone extraction 1 week ago and removed his stent POD#3. No flank pain. N hematuria. UA today is concerning for infection. No dysuria.    PMH: Past Medical History:  Diagnosis Date   Atypical mole 09/09/2014   malignant spindle cell-right temple   Basal cell carcinoma 10/16/2017   top left sholder-txpbx   BCC (basal cell carcinoma) 04/21/2017   right side-cx52f   BCC (basal cell carcinoma) 08/30/2014   back-cx376f  BCC (basal cell carcinoma) 07/05/1994   left temple-excision   BCC (basal cell carcinoma) 03/20/2009   right neck-excision   BCC (basal cell carcinoma) 03/20/2009   mid chest superior-[id number] BCC (basal cell carcinoma) 03/20/2009   mid chest inferior-cx35f1fBCC (basal cell carcinoma) 10/06/2012   right chest-[id number] polyps    GERD (gastroesophageal reflux disease)    H/O renal calculi    Hypertension    Melanoma (HCC) [place]13/2013   L lateral torso/left flank-tx Dr.Leshin   SCC (squamous cell carcinoma) 10/16/2017   right top scalp-mohs   SCC (squamous cell carcinoma) 10/16/2017   mid abdomen-txpbx   SCC (squamous cell carcinoma) 04/21/2018   lower lip-mohs   SCC (squamous cell carcinoma) 07/27/2018   right post scalp,left jawline,left temple, right temple,right sideburn   SCC (squamous cell carcinoma) 12/28/2018   medial scalp, left post scalp   SCC (squamous cell carcinoma) 02/16/2020   left ear rim, left inner ear,left sideburn superior,left sideburn inferior,tip of nose   SCC (squamous cell carcinoma) 01/10/2016   right forehead-mohs, right inner wrist-cx35fu 50fC (squamous cell carcinoma) 03/21/2016   left abdomen   SCC (squamous cell carcinoma) 09/24/2016    left cheek superior,inferior left cheek,inferior left cheek medial,left sideburn,right temple superior,right temple inferior   SCC (squamous cell carcinoma) 12/12/2016   left inner cheek-txpbx   SCC (squamous cell carcinoma) 04/21/2017   right temple superior-mohs,right inner cheek, left cheek superior-mohs   SCC (squamous cell carcinoma) 01/12/2013   right cheek-cx35fu,r39f hand-clear   SCC (squamous cell carcinoma) 03/18/2013   right jawline-txpbx   SCC (squamous cell carcinoma) 11/23/2013   left inner cheek, right scalp, right forehead,right cheek-cx35fu   72f(squamous cell carcinoma) 01/06/2014   right temple-mohs   SCC (squamous cell carcinoma) 08/30/2014   back-cx35fu, le16fheek-cx35u-excision   SCC (squamous cell carcinoma) 01/25/2015   right temple,right temple inf, left cheek-cx35fu   SC18fquamous cell carcinoma) 09/09/2015   post scalp,right forhead lateral, right temple   SCC (squamous cell carcinoma) 03/20/2009   left temple   SCC (squamous cell carcinoma) 01/14/2011   left hairline, bridge of nose   SCC (squamous cell carcinoma) 10/07/2011   under right inner eye, left cheek near temple, right cheek   SCC (squamous cell carcinoma) 10/12/2015   frontal scalp   SCCA (squamous cell carcinoma) of skin 06/27/2020   Left Hand Posterior (well diff)   SCCA (squamous cell carcinoma) of skin 06/27/2020   Right Hand Posterior (well diff)   SCCA (squamous cell carcinoma) of skin 06/27/2020   Scalp (well diff)   SCCA (squamous cell carcinoma) of skin 06/27/2020   Right Ear Rim (well diff)   SCCA (squamous cell carcinoma) of skin 09/14/2020   well diff-mid  forehead   SCCA (squamous cell carcinoma) of skin 09/14/2020   in situ-mid parietal scalp-anterior   SCCA (squamous cell carcinoma) of skin 09/14/2020   mid parietal scalp-posterior   Skin cancer    Squamous cell carcinoma of skin 10/16/2017   left top scalp-txpbx    Surgical History: Past Surgical History:   Procedure Laterality Date   BILIARY STENT PLACEMENT N/A 05/20/2013   Procedure: BILIARY STENT PLACEMENT;  Surgeon: Daneil Dolin, MD;  Location: [facility];  Service: Gastroenterology;  Laterality: N/A;   CHOLECYSTECTOMY N/A 0000000   complicated by cystic duct stump leak and gb fossa abscess. Procedure: LAPAROSCOPIC CHOLECYSTECTOMY;  Surgeon: Jamesetta So, MD;  Location: [facility];  Service: General;  Laterality: N/A;   COLONOSCOPY  2004   Dr. Laural Golden: few scattered diverticula at sigmoid and transverse colon, polyps. path unknown.    COLONOSCOPY  July 2004   Dr. Gala Romney. Few scattered diverticula sigmoid and transverse colon, 2 small rectal polyps (path unavailable)   COLONOSCOPY N/A 09/21/2013   Procedure: COLONOSCOPY;  Surgeon: Daneil Dolin, MD;  Location: [facility];  Service: Endoscopy;  Laterality: N/A;  10:30 AM   CYSTOSCOPY     CYSTOSCOPY W/ URETERAL STENT PLACEMENT Left 06/29/2021   Procedure: CYSTOSCOPY WITH RETROGRADE PYELOGRAM/URETERAL STENT PLACEMENT;  Surgeon: Bjorn Loser, MD;  Location: [facility];  Service: Urology;  Laterality: Left;   CYSTOSCOPY WITH RETROGRADE PYELOGRAM, URETEROSCOPY AND STENT PLACEMENT Left 07/26/2021   Procedure: CYSTOSCOPY WITH RETROGRADE PYELOGRAM, URETEROSCOPY AND STENT EXCHANGE;  Surgeon: Cleon Gustin, MD;  Location: [facility];  Service: Urology;  Laterality: Left;   ERCP N/A 05/20/2013   RMR: cystic duct stump leak s/p cholecystectomy. +choledocholithiasis, s/p sphinterotomy, stent placement, stone extraction   ERCP N/A 07/22/2013   RMR: S/P removal of biliary stent along with removal of common duct stones as described above, status post sphincterotomy balloon dilation and balloon dredging of the biliary tree Previously noted biliary leak sealed/Duodenal diverticulum   ESOPHAGOGASTRODUODENOSCOPY N/A 06/08/2013   [place] esophageal erosions in the distal one half of the esophagus. 2 cm segment of markedly inflamed and swollen duodenal mucosa at the  junction of the bulb and second portion. Significant encroachment on the lumen causing GOO. Bx: benign  Surgeon: Daneil Dolin, MD;  [facility];  Service: Endoscopy;  Laterality: N/A;  WITH PHENERGAN 12.5 mg IV on call    EXTRACORPOREAL SHOCK WAVE LITHOTRIPSY     EYE SURGERY     EYE SURGERY Left 35 years ago   HOLMIUM LASER APPLICATION Left [id number]   Procedure: HOLMIUM LASER APPLICATION;  Surgeon: Cleon Gustin, MD;  Location: [facility];  Service: Urology;  Laterality: Left;   KIDNEY STONE SURGERY     MELANOMA EXCISION Left 08/13   [facility]   REMOVAL OF STONES N/A 07/22/2013   Procedure: REMOVAL OF common bile duct STONES;  Surgeon: Daneil Dolin, MD;  Location: [facility];  Service: Endoscopy;  Laterality: N/A;   SPHINCTEROTOMY N/A 05/20/2013   Procedure: SPHINCTEROTOMY;  Surgeon: Daneil Dolin, MD;  Location: [facility];  Service: Gastroenterology;  Laterality: N/A;   SPHINCTEROTOMY N/A 07/22/2013   Procedure: SPHINCTEROTOMY with dilation;  Surgeon: Daneil Dolin, MD;  Location: [facility];  Service: Endoscopy;  Laterality: N/A;   STENT REMOVAL N/A 07/22/2013   Procedure: Biliary STENT REMOVAL;  Surgeon: Daneil Dolin, MD;  Location: [facility];  Service: Endoscopy;  Laterality: N/A;    Home Medications:  Allergies as of 08/03/2021  No Known Allergies      Medication List        Accurate as of August 03, 2021 11:16 AM. If you have any questions, ask your nurse or doctor.          albuterol 108 (90 Base) MCG/ACT inhaler Commonly known as: VENTOLIN HFA Inhale 2 puffs into the lungs every 6 (six) hours as needed for wheezing or shortness of breath.   amLODipine 10 MG tablet Commonly known as: NORVASC Take 10 mg by mouth daily.   aspirin 325 MG tablet Take 1 tablet (325 mg total) by mouth daily.   FeroSul 325 (65 FE) MG tablet Generic drug: ferrous sulfate Take 325 mg by mouth daily as needed.   oxyCODONE-acetaminophen 5-325 MG tablet Commonly known as:  Percocet Take 1 tablet by mouth every 4 (four) hours as needed for severe pain.   tobramycin-dexamethasone ophthalmic solution Commonly known as: TOBRADEX Place 1 drop into the right eye in the morning and at bedtime.   VITAMIN D PO Take 1 tablet by mouth daily.        Allergies: No Known Allergies  Family History: Family History  Problem Relation Age of Onset   Cancer Father    Cancer Brother    Colon cancer Neg Hx     Social History:  reports that he has been smoking cigarettes. He has a 25.00 pack-year smoking history. He has never used smokeless tobacco. He reports that he does not drink alcohol and does not use drugs.  ROS: All other review of systems were reviewed and are negative except what is noted above in HPI  Physical Exam: BP (!) 146/82   Pulse 86   Constitutional:  Alert and oriented, No acute distress. HEENT: [place] AT, moist mucus membranes.  Trachea midline, no masses. Cardiovascular: No clubbing, cyanosis, or edema. Respiratory: Normal respiratory effort, no increased work of breathing. GI: Abdomen is soft, nontender, nondistended, no abdominal masses GU: No CVA tenderness.  Lymph: No cervical or inguinal lymphadenopathy. Skin: No rashes, bruises or suspicious lesions. Neurologic: Grossly intact, no focal deficits, moving all 4 extremities. Psychiatric: Normal mood and affect.  Laboratory Data: Lab Results  Component Value Date   WBC 16.8 (H) 07/02/2021   HGB 11.5 (L) 07/02/2021   HCT 34.2 (L) 07/02/2021   MCV 104.0 (H) 07/02/2021   PLT 187 07/02/2021    Lab Results  Component Value Date   CREATININE 1.65 (H) 07/02/2021    No results found for: PSA  No results found for: TESTOSTERONE  No results found for: HGBA1C  Urinalysis    Component Value Date/Time   COLORURINE YELLOW 06/28/2021 1233   APPEARANCEUR TURBID (A) 06/28/2021 1233   LABSPEC 1.018 06/28/2021 1233   PHURINE 5.0 06/28/2021 1233   GLUCOSEU NEGATIVE 06/28/2021 1233    HGBUR MODERATE (A) 06/28/2021 1233   BILIRUBINUR NEGATIVE 06/28/2021 1233   KETONESUR 5 (A) 06/28/2021 1233   PROTEINUR 100 (A) 06/28/2021 1233   NITRITE NEGATIVE 06/28/2021 1233   LEUKOCYTESUR LARGE (A) 06/28/2021 1233    Lab Results  Component Value Date   BACTERIA NONE SEEN 06/28/2021    Pertinent Imaging:  Results for orders placed in visit on 09/20/02  DG Abd 1 View  Narrative FINDINGS CLINICAL DATA:  RENAL CALCULUS. ABDOMEN ONE VIEW COMPARISON STUDIES ARE DATED 03/20/00 AND 11/12/00.  NO ABNORMAL CALCIFICATIONS ARE SEEN OVERLYING THE ABDOMEN OR PELVIS.  PHLEBOLITHS ARE STABLE WITHIN THE PELVIS.  A CALCIFIED GRANULOMA IS PRESENT OVER THE RIGHT ILIAC  WING.  NO GROSS ORGANOMEGALY IS PRESENT.  DEGENERATIVE CHANGES ARE PRESENT WITHIN THE LOWER LUMBAR SPINE. IMPRESSION STABLE ABDOMEN.  Results for orders placed during the hospital encounter of 06/28/21  US Venous Img Lower Bilateral (DVT)  Narrative CLINICAL DATA:  Bilateral lower extremity edema status post fall 4 days ago. Syncope.  EXAM: BILATERAL LOWER EXTREMITY VENOUS DOPPLER ULTRASOUND  TECHNIQUE: Gray-scale sonography with compression, as well as color and duplex ultrasound, were performed to evaluate the deep venous system(s) from the level of the common femoral vein through the popliteal and proximal calf veins.  COMPARISON:  None.  FINDINGS: VENOUS  There is thrombosis of the greater saphenous vein at the level of the right calf.  Normal compressibility of the common femoral, superficial femoral, and popliteal veins. Visualized portions of profunda femoral vein unremarkable. No filling defects to suggest DVT on grayscale or color Doppler imaging. Doppler waveforms show normal direction of venous flow, normal respiratory plasticity and response to augmentation.  OTHER  None.  Limitations: none  IMPRESSION: 1. No deep vein thrombosis. 2. Superficial venous thrombosis of right greater  saphenous vein at the level of the calf.  These results will be called to the ordering clinician or representative by the Radiologist Assistant, and communication documented in the PACS or [employer].   Electronically Signed By: Miachel Roux M.D. On: 07/01/2021 13:14  No results found for this or any previous visit.  No results found for this or any previous visit.  No results found for this or any previous visit.  No results found for this or any previous visit.  No results found for this or any previous visit.  No results found for this or any previous visit.   Assessment & Plan:    1. Left ureteral calculus RTC 6 weeks with renal US - Urinalysis, Routine w reflex microscopic   Return in about 6 weeks (around 09/14/2021).  Nicolette Bang, MD  [facility] [place]

## 2021-08-29 ENCOUNTER — Other Ambulatory Visit: Payer: Self-pay

## 2021-08-29 ENCOUNTER — Encounter: Payer: Self-pay | Admitting: Pulmonary Disease

## 2021-08-29 ENCOUNTER — Ambulatory Visit (INDEPENDENT_AMBULATORY_CARE_PROVIDER_SITE_OTHER): Payer: Medicare Other | Admitting: Pulmonary Disease

## 2021-08-29 DIAGNOSIS — J432 Centrilobular emphysema: Secondary | ICD-10-CM

## 2021-08-29 DIAGNOSIS — Z720 Tobacco use: Secondary | ICD-10-CM

## 2021-08-29 DIAGNOSIS — R0683 Snoring: Secondary | ICD-10-CM | POA: Diagnosis not present

## 2021-08-29 NOTE — Patient Instructions (Signed)
Schedule pFTs You have to QUIT smoking  Schedule home sleep study - you likely have obstructive sleep apnea

## 2021-08-29 NOTE — Progress Notes (Signed)
Subjective:    Patient ID: Sergio Schroeder, male    DOB: 1946/07/20, 75 y.o.   MRN: [id number]  HPI  Chief Complaint  Patient presents with   Consult    Patient is here following up hospital stay. States that after a CT scan on lungs from hospital stay. Had a blood clot in right lung. Experiencing SOB when moving around a lot.    75 year old smoker who is referred for evaluation of COPD after hospital visit. He was hospitalized 06/28/2021 for 5 days after he collapsed at the DME office, found to be hypotensive with AKI with left hydronephrosis and required left ureteric stent for renal calculus.  He was also noted to be wheezing and was treated for probable COPD exacerbation with steroids and bronchodilators and referred to outpatient pulmonary. Was also found to have a superficial thrombus in the right greater saphenous vein, placed on aspirin.  On follow-up with vascular on 7/20 repeat Doppler showed no changes. 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  He smoked more than half pack per day for 55 years, more than 30 pack years.  He reports an early morning cough and wheeze which he attributes to sinus drainage.  Once he clears his throat and coughs then he is good to go. Chest x-ray 7/7 independently reviewed does not show any effusions, minimal left basilar atelectasis. He reports chest cold and congestion about once a year for the last 3 years.  He is not on any maintenance bronchodilator.  He was given albuterol at the hospital but he is really not used this. He has a house at [employer] and he mostly lives there although his pulmonary rest is in [place]. Epworth sleepiness score is 10.  He only sleeps from 3 AM to 6:30 AM.  He cannot lie on his back or on his left side he sleeps on his right side with 1 pillow, reports a nap for about an hour in the afternoons.  He wakes up several times due to  nocturia There is no history suggestive of cataplexy, sleep paralysis or parasomnias   I have reviewed his hospital discharge summary and testing results, also reviewed vascular consultation     Past Medical History:  Diagnosis Date   Atypical mole 09/09/2014   malignant spindle cell-right temple   Basal cell carcinoma 10/16/2017   top left sholder-txpbx   BCC (basal cell carcinoma) 04/21/2017   right side-cx8f   BCC (basal cell carcinoma) 08/30/2014   back-cx361f  BCC (basal cell carcinoma) 07/05/1994   left temple-excision   BCC (basal cell carcinoma) 03/20/2009   right neck-excision   BCC (basal cell carcinoma) 03/20/2009   mid chest superior-[id number] BCC (basal cell carcinoma) 03/20/2009   mid chest inferior-[id number] (basal cell carcinoma) 10/06/2012   right chest-[id number] polyps    GERD (gastroesophageal reflux disease)    H/O renal calculi    Hypertension    Melanoma (HCC) [facility]2013   L lateral torso/left flank-tx Dr.Leshin   SCC (squamous cell carcinoma) 10/16/2017   right top scalp-mohs   SCC (squamous cell carcinoma) 10/16/2017   mid abdomen-txpbx   SCC (squamous cell carcinoma) 04/21/2018   lower lip-mohs   SCC (squamous cell carcinoma) 07/27/2018   right post scalp,left jawline,left temple, right temple,right sideburn   SCC (squamous cell carcinoma) 12/28/2018   medial scalp, left  post scalp   SCC (squamous cell carcinoma) 02/16/2020   left ear rim, left inner ear,left sideburn superior,left sideburn inferior,tip of nose   SCC (squamous cell carcinoma) 01/10/2016   right forehead-mohs, right inner wrist-cx33f   SCC (squamous cell carcinoma) 03/21/2016   left abdomen   SCC (squamous cell carcinoma) 09/24/2016   left cheek superior,inferior left cheek,inferior left cheek medial,left sideburn,right temple superior,right temple inferior   SCC (squamous cell carcinoma) 12/12/2016   left inner cheek-txpbx   SCC (squamous cell carcinoma)  04/21/2017   right temple superior-mohs,right inner cheek, left cheek superior-mohs   SCC (squamous cell carcinoma) 01/12/2013   right cheek-cx369fright hand-clear   SCC (squamous cell carcinoma) 03/18/2013   right jawline-txpbx   SCC (squamous cell carcinoma) 11/23/2013   left inner cheek, right scalp, right forehead,right cheek-cx3561f SCC (squamous cell carcinoma) 01/06/2014   right temple-mohs   SCC (squamous cell carcinoma) 08/30/2014   back-[id number] cheek-cx35u-excision   SCC (squamous cell carcinoma) 01/25/2015   right temple,right temple inf, left cheek-[id number] (squamous cell carcinoma) 09/09/2015   post scalp,right forhead lateral, right temple   SCC (squamous cell carcinoma) 03/20/2009   left temple   SCC (squamous cell carcinoma) 01/14/2011   left hairline, bridge of nose   SCC (squamous cell carcinoma) 10/07/2011   under right inner eye, left cheek near temple, right cheek   SCC (squamous cell carcinoma) 10/12/2015   frontal scalp   SCCA (squamous cell carcinoma) of skin 06/27/2020   Left Hand Posterior (well diff)   SCCA (squamous cell carcinoma) of skin 06/27/2020   Right Hand Posterior (well diff)   SCCA (squamous cell carcinoma) of skin 06/27/2020   Scalp (well diff)   SCCA (squamous cell carcinoma) of skin 06/27/2020   Right Ear Rim (well diff)   SCCA (squamous cell carcinoma) of skin 09/14/2020   well diff-mid forehead   SCCA (squamous cell carcinoma) of skin 09/14/2020   in situ-mid parietal scalp-anterior   SCCA (squamous cell carcinoma) of skin 09/14/2020   mid parietal scalp-posterior   Skin cancer    Squamous cell carcinoma of skin 10/16/2017   left top scalp-txpbx   Past Surgical History:  Procedure Laterality Date   BILIARY STENT PLACEMENT N/A 05/20/2013   Procedure: BILIARY STENT PLACEMENT;  Surgeon: RoberDaneil Dolin  Location: [facility];  Service: Gastroenterology;  Laterality: N/A;   CHOLECYSTECTOMY N/A 5/21/0000000mplicated by  cystic duct stump leak and gb fossa abscess. Procedure: LAPAROSCOPIC CHOLECYSTECTOMY;  Surgeon: Mark Jamesetta So  Location: [facility];  Service: General;  Laterality: N/A;   COLONOSCOPY  2004   Dr. RehmaLaural Golden scattered diverticula at sigmoid and transverse colon, polyps. path unknown.    COLONOSCOPY  July 2004   Dr. RourkGala Romney scattered diverticula sigmoid and transverse colon, 2 small rectal polyps (path unavailable)   COLONOSCOPY N/A 09/21/2013   Procedure: COLONOSCOPY;  Surgeon: RoberDaneil Dolin  Location: [facility];  Service: Endoscopy;  Laterality: N/A;  10:30 AM   CYSTOSCOPY     CYSTOSCOPY W/ URETERAL STENT PLACEMENT Left 06/29/2021   Procedure: CYSTOSCOPY WITH RETROGRADE PYELOGRAM/URETERAL STENT PLACEMENT;  Surgeon: MacDiBjorn Loser  Location: [facility];  Service: Urology;  Laterality: Left;   CYSTOSCOPY WITH RETROGRADE PYELOGRAM, URETEROSCOPY AND STENT PLACEMENT Left 07/26/2021   Procedure: CYSTOSCOPY WITH RETROGRADE PYELOGRAM, URETEROSCOPY AND STENT EXCHANGE;  Surgeon: McKenCleon Gustin  Location: [facility];  Service: Urology;  Laterality: Left;   ERCP N/A  05/20/2013   RMR: cystic duct stump leak s/p cholecystectomy. +choledocholithiasis, s/p sphinterotomy, stent placement, stone extraction   ERCP N/A 07/22/2013   RMR: S/P removal of biliary stent along with removal of common duct stones as described above, status post sphincterotomy balloon dilation and balloon dredging of the biliary tree Previously noted biliary leak sealed/Duodenal diverticulum   ESOPHAGOGASTRODUODENOSCOPY N/A 06/08/2013   [place] esophageal erosions in the distal one half of the esophagus. 2 cm segment of markedly inflamed and swollen duodenal mucosa at the junction of the bulb and second portion. Significant encroachment on the lumen causing GOO. Bx: benign  Surgeon: Daneil Dolin, MD;  [facility]: [facility];  Service: Endoscopy;  Laterality: N/A;  WITH PHENERGAN 12.5 mg IV on call    EXTRACORPOREAL SHOCK  WAVE LITHOTRIPSY     EYE SURGERY     EYE SURGERY Left 35 years ago   HOLMIUM LASER APPLICATION Left [id number]   Procedure: HOLMIUM LASER APPLICATION;  Surgeon: Cleon Gustin, MD;  Location: [facility];  Service: Urology;  Laterality: Left;   KIDNEY STONE SURGERY     MELANOMA EXCISION Left 08/13   [facility]   REMOVAL OF STONES N/A 07/22/2013   Procedure: REMOVAL OF common bile duct STONES;  Surgeon: Daneil Dolin, MD;  Location: [facility];  Service: Endoscopy;  Laterality: N/A;   SPHINCTEROTOMY N/A 05/20/2013   Procedure: SPHINCTEROTOMY;  Surgeon: Daneil Dolin, MD;  Location: [facility];  Service: Gastroenterology;  Laterality: N/A;   SPHINCTEROTOMY N/A 07/22/2013   Procedure: SPHINCTEROTOMY with dilation;  Surgeon: Daneil Dolin, MD;  Location: [facility];  Service: Endoscopy;  Laterality: N/A;   STENT REMOVAL N/A 07/22/2013   Procedure: Biliary STENT REMOVAL;  Surgeon: Daneil Dolin, MD;  Location: [facility];  Service: Endoscopy;  Laterality: N/A;    .all  Social History   Socioeconomic History   Marital status: Divorced    Spouse name: Not on file   Number of children: Not on file   Years of education: Not on file   Highest education level: Not on file  Occupational History   Not on file  Tobacco Use   Smoking status: Every Day    Packs/day: 0.50    Years: 50.00    Pack years: 25.00    Types: Cigarettes   Smokeless tobacco: Never  Vaping Use   Vaping Use: Former  Substance and Sexual Activity   Alcohol use: No    Alcohol/week: 0.0 standard drinks   Drug use: No    Types: Marijuana    Comment: none since April   Sexual activity: Yes    Birth control/protection: None  Other Topics Concern   Not on file  Social History Narrative   Not on file   Social Determinants of Health   Financial Resource Strain: Not on file  Food Insecurity: Not on file  Transportation Needs: Not on file  Physical Activity: Not on file  Stress: Not on file  Social Connections: Not on file  Intimate  Partner Violence: Not on file    Family History  Problem Relation Age of Onset   Cancer Father    Cancer Brother    Colon cancer Neg Hx       Review of Systems  Shortness of breath with activity Sneezing Depression  Constitutional: negative for anorexia, fevers and sweats  Eyes: negative for irritation, redness and visual disturbance  Ears, nose, mouth, throat, and face: negative for earaches, epistaxis, nasal congestion and sore throat  Cardiovascular: negative for chest pain, dyspnea, lower extremity edema, orthopnea, palpitations and syncope  Gastrointestinal: negative for abdominal pain, constipation, diarrhea, melena, nausea and vomiting  Genitourinary:negative for dysuria, frequency and hematuria  Hematologic/lymphatic: negative for bleeding, easy bruising and lymphadenopathy  Musculoskeletal:negative for arthralgias, muscle weakness and stiff joints  Neurological: negative for coordination problems, gait problems, headaches and weakness  Endocrine: negative for diabetic symptoms including polydipsia, polyuria and weight loss     Objective:   Physical Exam  Gen. Pleasant, obese, in no distress, normal affect ENT - no pallor,icterus, no post nasal drip, class 2 airway Neck: No JVD, no thyromegaly, no carotid bruits Lungs: no use of accessory muscles, no dullness to percussion, decreased without rales or rhonchi  Cardiovascular: Rhythm regular, heart sounds  normal, no murmurs or gallops, no peripheral edema Abdomen: soft and non-tender, no hepatosplenomegaly, BS normal. Musculoskeletal: No deformities, no cyanosis or clubbing Neuro:  alert, non focal, no tremors        Assessment & Plan:

## 2021-08-29 NOTE — Addendum Note (Signed)
Addended by: Fritzi Mandes D on: 08/29/2021 12:07 PM   Modules accepted: Orders

## 2021-08-29 NOTE — Assessment & Plan Note (Signed)
Emphasized that smoking cessation was the most important intervention that would add years to his life.  He understands this but is not ready to take the next step.

## 2021-08-29 NOTE — Assessment & Plan Note (Signed)
He likely has COPD although he appears to be well compensated and his symptoms are mostly early morning when he wakes up.  Thus he does not need maintenance bronchodilators. Offered him PFTs to check lung function Offered him low-dose CT screening for lung cancer

## 2021-08-29 NOTE — Assessment & Plan Note (Signed)
No bed partner sleep history available today.  This is also compounded by the fact that he is working third shift throughout his life.  However narrow pharyngeal exam, loud snoring, obesity is very indicated that he has underlying OSA. I offered him home sleep testing and he will think about this Pretest probability is high

## 2021-09-07 ENCOUNTER — Other Ambulatory Visit (HOSPITAL_COMMUNITY): Payer: Self-pay

## 2021-09-19 ENCOUNTER — Ambulatory Visit (HOSPITAL_COMMUNITY)
Admission: RE | Admit: 2021-09-19 | Discharge: 2021-09-19 | Disposition: A | Discharge status: Home / Self Care | Payer: Medicare Other | Source: Ambulatory Visit | Attending: Urology | Admitting: Urology

## 2021-09-19 ENCOUNTER — Other Ambulatory Visit: Payer: Self-pay

## 2021-09-19 DIAGNOSIS — N201 Calculus of ureter: Secondary | ICD-10-CM | POA: Insufficient documentation

## 2021-09-26 ENCOUNTER — Ambulatory Visit (INDEPENDENT_AMBULATORY_CARE_PROVIDER_SITE_OTHER): Payer: Medicare Other | Admitting: Urology

## 2021-09-26 ENCOUNTER — Other Ambulatory Visit: Payer: Self-pay

## 2021-09-26 ENCOUNTER — Encounter: Payer: Self-pay | Admitting: Urology

## 2021-09-26 VITALS — BP 149/74 | HR 69 | Ht 69.0 in | Wt 246.5 lb

## 2021-09-26 DIAGNOSIS — N201 Calculus of ureter: Secondary | ICD-10-CM

## 2021-09-26 LAB — URINALYSIS, ROUTINE W REFLEX MICROSCOPIC
Bilirubin, UA: NEGATIVE
Glucose, UA: NEGATIVE
Ketones, UA: NEGATIVE
Nitrite, UA: NEGATIVE
Specific Gravity, UA: 1.015 (ref 1.005–1.030)
Urobilinogen, Ur: 0.2 mg/dL (ref 0.2–1.0)
pH, UA: 5.5 (ref 5.0–7.5)

## 2021-09-26 LAB — MICROSCOPIC EXAMINATION
Bacteria, UA: NONE SEEN
Renal Epithel, UA: NONE SEEN /hpf

## 2021-09-26 NOTE — Progress Notes (Signed)
Urological Symptom Review  Patient is experiencing the following symptoms: Frequent urination Get up at night to urinate Leakage of urine Urinary tract infection Weak stream Erection problems (male only) Kidney stones  Review of Systems  Gastrointestinal (upper)  : Negative for upper GI symptoms  Gastrointestinal (lower) : Negative for lower GI symptoms  Constitutional : Negative for symptoms  Skin: Skin rash/lesion Itching  Eyes: Negative for eye symptoms  Ear/Nose/Throat : Sinus problems  Hematologic/Lymphatic: Easy bruising  Cardiovascular : Negative for cardiovascular symptoms  Respiratory : Shortness of breath  Endocrine: Negative for endocrine symptoms  Musculoskeletal: Back pain Joint pain  Neurological: Negative for neurological symptoms  Psychologic: Negative for psychiatric symptoms

## 2021-09-26 NOTE — Patient Instructions (Signed)

## 2021-09-26 NOTE — Progress Notes (Signed)
09/26/2021 10:17 AM   Sergio Schroeder 12-20-1946 [id number]  Referring provider: Jani Gravel, MD [place],  [place] [address]  Followup nephrolithiasis   HPI: Sergio Schroeder is a 75yo here for followup for nephrolithiasis. He denies any flank pain. No stone events sinc elast visit. Renal US from 9/28 shows no hydronephrosis and no calculi. No other complaints today   PMH: Past Medical History:  Diagnosis Date   Atypical mole 09/09/2014   malignant spindle cell-right temple   Basal cell carcinoma 10/16/2017   top left sholder-txpbx   BCC (basal cell carcinoma) 04/21/2017   right side-cx37fu   BCC (basal cell carcinoma) 08/30/2014   back-cx54fu   BCC (basal cell carcinoma) 07/05/1994   left temple-excision   BCC (basal cell carcinoma) 03/20/2009   right neck-excision   BCC (basal cell carcinoma) 03/20/2009   mid chest superior-cx33fu   BCC (basal cell carcinoma) 03/20/2009   mid chest inferior-cx44fu   BCC (basal cell carcinoma) 10/06/2012   right chest-cx62fu   Colon polyps    GERD (gastroesophageal reflux disease)    H/O renal calculi    Hypertension    Melanoma ([place]) 08/04/2012   L lateral torso/left flank-tx Dr.Leshin   SCC (squamous cell carcinoma) 10/16/2017   right top scalp-mohs   SCC (squamous cell carcinoma) 10/16/2017   mid abdomen-txpbx   SCC (squamous cell carcinoma) 04/21/2018   lower lip-mohs   SCC (squamous cell carcinoma) 07/27/2018   right post scalp,left jawline,left temple, right temple,right sideburn   SCC (squamous cell carcinoma) 12/28/2018   medial scalp, left post scalp   SCC (squamous cell carcinoma) 02/16/2020   left ear rim, left inner ear,left sideburn superior,left sideburn inferior,tip of nose   SCC (squamous cell carcinoma) 01/10/2016   right forehead-mohs, right inner wrist-cx84fu   SCC (squamous cell carcinoma) 03/21/2016   left abdomen   SCC (squamous cell carcinoma) 09/24/2016   left cheek  superior,inferior left cheek,inferior left cheek medial,left sideburn,right temple superior,right temple inferior   SCC (squamous cell carcinoma) 12/12/2016   left inner cheek-txpbx   SCC (squamous cell carcinoma) 04/21/2017   right temple superior-mohs,right inner cheek, left cheek superior-mohs   SCC (squamous cell carcinoma) 01/12/2013   right cheek-cx16fu,right hand-clear   SCC (squamous cell carcinoma) 03/18/2013   right jawline-txpbx   SCC (squamous cell carcinoma) 11/23/2013   left inner cheek, right scalp, right forehead,right cheek-cx85fu   SCC (squamous cell carcinoma) 01/06/2014   right temple-mohs   SCC (squamous cell carcinoma) 08/30/2014   back-cx65fu, left cheek-cx35u-excision   SCC (squamous cell carcinoma) 01/25/2015   right temple,right temple inf, left cheek-cx59fu   SCC (squamous cell carcinoma) 09/09/2015   post scalp,right forhead lateral, right temple   SCC (squamous cell carcinoma) 03/20/2009   left temple   SCC (squamous cell carcinoma) 01/14/2011   left hairline, bridge of nose   SCC (squamous cell carcinoma) 10/07/2011   under right inner eye, left cheek near temple, right cheek   SCC (squamous cell carcinoma) 10/12/2015   frontal scalp   SCCA (squamous cell carcinoma) of skin 06/27/2020   Left Hand Posterior (well diff)   SCCA (squamous cell carcinoma) of skin 06/27/2020   Right Hand Posterior (well diff)   SCCA (squamous cell carcinoma) of skin 06/27/2020   Scalp (well diff)   SCCA (squamous cell carcinoma) of skin 06/27/2020   Right Ear Rim (well diff)   SCCA (squamous cell carcinoma) of skin 09/14/2020   well diff-mid forehead  SCCA (squamous cell carcinoma) of skin 09/14/2020   in situ-mid parietal scalp-anterior   SCCA (squamous cell carcinoma) of skin 09/14/2020   mid parietal scalp-posterior   Skin cancer    Squamous cell carcinoma of skin 10/16/2017   left top scalp-txpbx    Surgical History: Past Surgical History:  Procedure  Laterality Date   BILIARY STENT PLACEMENT N/A 05/20/2013   Procedure: BILIARY STENT PLACEMENT;  Surgeon: Daneil Dolin, MD;  Location: [facility];  Service: Gastroenterology;  Laterality: N/A;   CHOLECYSTECTOMY N/A 6/31/4970   complicated by cystic duct stump leak and gb fossa abscess. Procedure: LAPAROSCOPIC CHOLECYSTECTOMY;  Surgeon: Jamesetta So, MD;  Location: [facility];  Service: General;  Laterality: N/A;   COLONOSCOPY  2004   Dr. Laural Golden: few scattered diverticula at sigmoid and transverse colon, polyps. path unknown.    COLONOSCOPY  July 2004   Dr. Gala Romney. Few scattered diverticula sigmoid and transverse colon, 2 small rectal polyps (path unavailable)   COLONOSCOPY N/A 09/21/2013   Procedure: COLONOSCOPY;  Surgeon: Daneil Dolin, MD;  Location: [facility];  Service: Endoscopy;  Laterality: N/A;  10:30 AM   CYSTOSCOPY     CYSTOSCOPY W/ URETERAL STENT PLACEMENT Left 06/29/2021   Procedure: CYSTOSCOPY WITH RETROGRADE PYELOGRAM/URETERAL STENT PLACEMENT;  Surgeon: Bjorn Loser, MD;  Location: [facility];  Service: Urology;  Laterality: Left;   CYSTOSCOPY WITH RETROGRADE PYELOGRAM, URETEROSCOPY AND STENT PLACEMENT Left 07/26/2021   Procedure: CYSTOSCOPY WITH RETROGRADE PYELOGRAM, URETEROSCOPY AND STENT EXCHANGE;  Surgeon: Cleon Gustin, MD;  Location: [facility];  Service: Urology;  Laterality: Left;   ERCP N/A 05/20/2013   RMR: cystic duct stump leak s/p cholecystectomy. +choledocholithiasis, s/p sphinterotomy, stent placement, stone extraction   ERCP N/A 07/22/2013   RMR: S/P removal of biliary stent along with removal of common duct stones as described above, status post sphincterotomy balloon dilation and balloon dredging of the biliary tree Previously noted biliary leak sealed/Duodenal diverticulum   ESOPHAGOGASTRODUODENOSCOPY N/A 06/08/2013   [place] esophageal erosions in the distal one half of the esophagus. 2 cm segment of markedly inflamed and swollen duodenal mucosa at the junction of the  bulb and second portion. Significant encroachment on the lumen causing GOO. Bx: benign  Surgeon: Daneil Dolin, MD;  [facility];  Service: Endoscopy;  Laterality: N/A;  WITH PHENERGAN 12.5 mg IV on call    EXTRACORPOREAL SHOCK WAVE LITHOTRIPSY     EYE SURGERY     EYE SURGERY Left 35 years ago   HOLMIUM LASER APPLICATION Left 01/29/3784   Procedure: HOLMIUM LASER APPLICATION;  Surgeon: Cleon Gustin, MD;  Location: [facility];  Service: Urology;  Laterality: Left;   KIDNEY STONE SURGERY     MELANOMA EXCISION Left 08/13   [facility]   REMOVAL OF STONES N/A 07/22/2013   Procedure: REMOVAL OF common bile duct STONES;  Surgeon: Daneil Dolin, MD;  Location: [facility];  Service: Endoscopy;  Laterality: N/A;   SPHINCTEROTOMY N/A 05/20/2013   Procedure: SPHINCTEROTOMY;  Surgeon: Daneil Dolin, MD;  Location: [facility];  Service: Gastroenterology;  Laterality: N/A;   SPHINCTEROTOMY N/A 07/22/2013   Procedure: SPHINCTEROTOMY with dilation;  Surgeon: Daneil Dolin, MD;  Location: [facility];  Service: Endoscopy;  Laterality: N/A;   STENT REMOVAL N/A 07/22/2013   Procedure: Biliary STENT REMOVAL;  Surgeon: Daneil Dolin, MD;  Location: [facility];  Service: Endoscopy;  Laterality: N/A;    Home Medications:  Allergies as of 09/26/2021   No  Known Allergies      Medication List        Accurate as of September 26, 2021 10:17 AM. If you have any questions, ask your nurse or doctor.          albuterol 108 (90 Base) MCG/ACT inhaler Commonly known as: VENTOLIN HFA Inhale 2 puffs into the lungs every 6 (six) hours as needed for wheezing or shortness of breath.   amLODipine 10 MG tablet Commonly known as: NORVASC Take 10 mg by mouth daily.   aspirin 325 MG tablet Take 1 tablet (325 mg total) by mouth daily.   FeroSul 325 (65 FE) MG tablet Generic drug: ferrous sulfate Take 325 mg by mouth daily as needed.   VITAMIN D PO Take 1 tablet by mouth daily.        Allergies: No Known  Allergies  Family History: Family History  Problem Relation Age of Onset   Cancer Father    Cancer Brother    Colon cancer Neg Hx     Social History:  reports that he has been smoking cigarettes. He has a 25.00 pack-year smoking history. He has never used smokeless tobacco. He reports that he does not drink alcohol and does not use drugs.  ROS: All other review of systems were reviewed and are negative except what is noted above in HPI  Physical Exam: BP (!) 149/74   Pulse 69   Ht 5\' 9"  (1.753 m)   Wt 246 lb 8 oz (111.8 kg)   BMI 36.40 kg/m   Constitutional:  Alert and oriented, No acute distress. HEENT: [place] AT, moist mucus membranes.  Trachea midline, no masses. Cardiovascular: No clubbing, cyanosis, or edema. Respiratory: Normal respiratory effort, no increased work of breathing. GI: Abdomen is soft, nontender, nondistended, no abdominal masses GU: No CVA tenderness.  Lymph: No cervical or inguinal lymphadenopathy. Skin: No rashes, bruises or suspicious lesions. Neurologic: Grossly intact, no focal deficits, moving all 4 extremities. Psychiatric: Normal mood and affect.  Laboratory Data: Lab Results  Component Value Date   WBC 16.8 (H) 07/02/2021   HGB 11.5 (L) 07/02/2021   HCT 34.2 (L) 07/02/2021   MCV 104.0 (H) 07/02/2021   PLT 187 07/02/2021    Lab Results  Component Value Date   CREATININE 1.65 (H) 07/02/2021    No results found for: PSA  No results found for: TESTOSTERONE  No results found for: HGBA1C  Urinalysis    Component Value Date/Time   COLORURINE YELLOW 06/28/2021 1233   APPEARANCEUR Clear 08/03/2021 1055   LABSPEC 1.018 06/28/2021 1233   PHURINE 5.0 06/28/2021 1233   GLUCOSEU Negative 08/03/2021 1055   HGBUR MODERATE (A) 06/28/2021 1233   BILIRUBINUR Negative 08/03/2021 1055   KETONESUR 5 (A) 06/28/2021 1233   PROTEINUR 2+ (A) 08/03/2021 1055   PROTEINUR 100 (A) 06/28/2021 1233   NITRITE Negative 08/03/2021 1055   NITRITE NEGATIVE  06/28/2021 1233   LEUKOCYTESUR 1+ (A) 08/03/2021 1055   LEUKOCYTESUR LARGE (A) 06/28/2021 1233    Lab Results  Component Value Date   LABMICR See below: 08/03/2021   WBCUA 11-30 (A) 08/03/2021   LABEPIT 0-10 08/03/2021   MUCUS Present 08/03/2021   BACTERIA Few 08/03/2021    Pertinent Imaging: Renal US 09/19/2021: Images reviewed and discussed with the patient  Results for orders placed in visit on 09/20/02  DG Abd 1 View  Narrative FINDINGS CLINICAL DATA:  RENAL CALCULUS. ABDOMEN ONE VIEW COMPARISON STUDIES ARE DATED 03/20/00 AND 11/12/00.  NO ABNORMAL CALCIFICATIONS  ARE SEEN OVERLYING THE ABDOMEN OR PELVIS.  PHLEBOLITHS ARE STABLE WITHIN THE PELVIS.  A CALCIFIED GRANULOMA IS PRESENT OVER THE RIGHT ILIAC WING.  NO GROSS ORGANOMEGALY IS PRESENT.  DEGENERATIVE CHANGES ARE PRESENT WITHIN THE LOWER LUMBAR SPINE. IMPRESSION STABLE ABDOMEN.  Results for orders placed during the hospital encounter of 06/28/21  US Venous Img Lower Bilateral (DVT)  Narrative CLINICAL DATA:  Bilateral lower extremity edema status post fall 4 days ago. Syncope.  EXAM: BILATERAL LOWER EXTREMITY VENOUS DOPPLER ULTRASOUND  TECHNIQUE: Gray-scale sonography with compression, as well as color and duplex ultrasound, were performed to evaluate the deep venous system(s) from the level of the common femoral vein through the popliteal and proximal calf veins.  COMPARISON:  None.  FINDINGS: VENOUS  There is thrombosis of the greater saphenous vein at the level of the right calf.  Normal compressibility of the common femoral, superficial femoral, and popliteal veins. Visualized portions of profunda femoral vein unremarkable. No filling defects to suggest DVT on grayscale or color Doppler imaging. Doppler waveforms show normal direction of venous flow, normal respiratory plasticity and response to augmentation.  OTHER  None.  Limitations: none  IMPRESSION: 1. No deep vein thrombosis. 2.  Superficial venous thrombosis of right greater saphenous vein at the level of the calf.  These results will be called to the ordering clinician or representative by the Radiologist Assistant, and communication documented in the PACS or [employer].   Electronically Signed By: Miachel Roux M.D. On: 07/01/2021 13:14  No results found for this or any previous visit.  No results found for this or any previous visit.  Results for orders placed during the hospital encounter of 09/19/21  Ultrasound renal complete  Narrative CLINICAL DATA:  Nephrolithiasis.  EXAM: RENAL / URINARY TRACT ULTRASOUND COMPLETE  COMPARISON:  CT abdomen and pelvis 06/28/2021.  FINDINGS: Right Kidney:  Renal measurements: 12 x 5.3 x 6.3 = volume: 212 mL. Echogenicity within normal limits. There is no hydronephrosis. There is a 2.0 x 1.8 x 1.9 cm hypoechoic area in the inferior right kidney. This corresponds to cyst seen on prior CT.  Left Kidney:  Renal measurements: 11.6 x 5.9 x 5.4 cm = volume: 192 mL. Echogenicity within normal limits. No mass or hydronephrosis visualized.  Bladder:  Appears normal for degree of bladder distention.  Other:  None.  IMPRESSION: 1. No hydronephrosis. 2. 2.0 cm right renal cyst.   Electronically Signed By: Ronney Asters M.D. On: 09/20/2021 23:27  No results found for this or any previous visit.  No results found for this or any previous visit.  No results found for this or any previous visit.   Assessment & Plan:    1. Left ureteral calculus -RTC 1 year with renal US - Urinalysis, Routine w reflex microscopic   No follow-ups on file.  Nicolette Bang, MD  [facility] [place]

## 2022-09-25 ENCOUNTER — Ambulatory Visit: Payer: Medicare Other | Admitting: Urology

## 2022-10-17 ENCOUNTER — Other Ambulatory Visit: Payer: Self-pay

## 2022-10-17 DIAGNOSIS — N201 Calculus of ureter: Secondary | ICD-10-CM

## 2022-10-18 ENCOUNTER — Ambulatory Visit: Payer: Medicare Other | Admitting: Urology

## 2022-11-22 DEATH — deceased
